# Patient Record
Sex: Female | Born: 1995
Health system: Southern US, Community
[De-identification: ages and names within clinical notes are randomized; demographics above are authoritative.]

## PROBLEM LIST (undated history)

## (undated) ENCOUNTER — Inpatient Hospital Stay (HOSPITAL_COMMUNITY): Payer: Self-pay

## (undated) DIAGNOSIS — R079 Chest pain, unspecified: Secondary | ICD-10-CM

## (undated) DIAGNOSIS — E119 Type 2 diabetes mellitus without complications: Secondary | ICD-10-CM

## (undated) DIAGNOSIS — R569 Unspecified convulsions: Secondary | ICD-10-CM

## (undated) DIAGNOSIS — O139 Gestational [pregnancy-induced] hypertension without significant proteinuria, unspecified trimester: Secondary | ICD-10-CM

## (undated) DIAGNOSIS — J45909 Unspecified asthma, uncomplicated: Secondary | ICD-10-CM

## (undated) DIAGNOSIS — R519 Headache, unspecified: Secondary | ICD-10-CM

## (undated) DIAGNOSIS — F32A Depression, unspecified: Secondary | ICD-10-CM

## (undated) DIAGNOSIS — F419 Anxiety disorder, unspecified: Secondary | ICD-10-CM

## (undated) DIAGNOSIS — B999 Unspecified infectious disease: Secondary | ICD-10-CM

## (undated) DIAGNOSIS — D219 Benign neoplasm of connective and other soft tissue, unspecified: Secondary | ICD-10-CM

## (undated) DIAGNOSIS — R011 Cardiac murmur, unspecified: Secondary | ICD-10-CM

## (undated) DIAGNOSIS — F329 Major depressive disorder, single episode, unspecified: Secondary | ICD-10-CM

## (undated) DIAGNOSIS — I10 Essential (primary) hypertension: Secondary | ICD-10-CM

## (undated) DIAGNOSIS — R0602 Shortness of breath: Secondary | ICD-10-CM

## (undated) HISTORY — DX: Headache, unspecified: R51.9

## (undated) HISTORY — PX: NO PAST SURGERIES: SHX2092

## (undated) HISTORY — DX: Chest pain, unspecified: R07.9

## (undated) HISTORY — DX: Shortness of breath: R06.02

## (undated) HISTORY — DX: Unspecified asthma, uncomplicated: J45.909

## (undated) HISTORY — PX: WISDOM TOOTH EXTRACTION: SHX21

---
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
## 2016-02-03 DIAGNOSIS — O1490 Unspecified pre-eclampsia, unspecified trimester: Secondary | ICD-10-CM

## 2016-06-09 ENCOUNTER — Emergency Department (HOSPITAL_COMMUNITY): Payer: Medicaid Other

## 2016-06-09 ENCOUNTER — Encounter (HOSPITAL_COMMUNITY): Payer: Self-pay | Admitting: Emergency Medicine

## 2016-06-09 ENCOUNTER — Emergency Department (HOSPITAL_COMMUNITY)
Admission: EM | Admit: 2016-06-09 | Discharge: 2016-06-10 | Disposition: A | Discharge status: Home / Self Care | Payer: Medicaid Other | Attending: Emergency Medicine | Admitting: Emergency Medicine

## 2016-06-09 DIAGNOSIS — B349 Viral infection, unspecified: Secondary | ICD-10-CM | POA: Insufficient documentation

## 2016-06-09 MED ORDER — ONDANSETRON 4 MG PO TBDP
4.0000 mg | ORAL_TABLET | Freq: Once | ORAL | Status: AC
  Administered 2016-06-09: 4 mg via ORAL
  Filled 2016-06-09: qty 1

## 2016-06-09 MED ORDER — ACETAMINOPHEN 325 MG PO TABS
ORAL_TABLET | ORAL | Status: AC
  Filled 2016-06-09: qty 2

## 2016-06-09 MED ORDER — ACETAMINOPHEN 325 MG PO TABS
650.0000 mg | ORAL_TABLET | Freq: Once | ORAL | Status: AC
  Administered 2016-06-09: 650 mg via ORAL

## 2016-06-09 NOTE — ED Triage Notes (Signed)
Pt presents to ED for assessment of 2 days worth of nausea, vomiting, fevers and chills, body aches, congestion, cough, sore throat, and rib pain with coughing.

## 2016-06-09 NOTE — ED Provider Notes (Signed)
[facility] DEPT Provider Note   CSN: [id number] Arrival date & time: 06/09/16  2203     History   Chief Complaint Chief Complaint  Patient presents with  . flu-like symptoms    HPI Margaret Ingram is a 21 y.o. female.  This is a 21 year old female reports that for the past 2 days.  She's had myalgias, low-grade fever, cough, sore throat.  Today she had nausea and vomiting.  No diarrhea. She is 4 months postpartum, not nursing, she had a Norplant Birth control  and placed after delivery      Past Medical History:  Diagnosis Date  . Heart murmur     There are no active problems to display for this patient.   History reviewed. No pertinent surgical history.  OB History    Gravida Para Term Preterm AB Living   1             SAB TAB Ectopic Multiple Live Births                   Home Medications    Prior to Admission medications   Medication Sig Start Date End Date Taking? Authorizing Provider  ondansetron (ZOFRAN) 4 MG tablet Take 1 tablet (4 mg total) by mouth every 6 (six) hours. 06/10/16   Junius Creamer, NP  Prenatal Vit-Fe Fumarate-FA (PRENATAL COMPLETE) 14-0.4 MG TABS Take 1 tablet by mouth every morning. 06/27/15   Veatrice Kells, MD    Family History Family History  Problem Relation Age of Onset  . Diabetes Other     Social History Social History  Substance Use Topics  . Smoking status: Never Smoker  . Smokeless tobacco: Never Used  . Alcohol use No     Allergies   Azithromycin   Review of Systems Review of Systems  Constitutional: Positive for fever.  HENT: Positive for sore throat.   Respiratory: Positive for cough. Negative for shortness of breath.   Gastrointestinal: Positive for nausea and vomiting. Negative for diarrhea.  Genitourinary: Negative for dysuria.  All other systems reviewed and are negative.    Physical Exam Updated Vital Signs Pulse (!) 111   Temp (!) 101.4 F (38.6 C) (Oral)   Resp 16   Ht 5\' 2"  (1.575 m)    Wt 98.9 kg   SpO2 97%   Breastfeeding? Unknown   BMI 39.87 kg/m   Physical Exam  Constitutional: She appears well-developed and well-nourished. No distress.  HENT:  Head: Normocephalic.  Mouth/Throat: Oropharynx is clear and moist. No oropharyngeal exudate.  Eyes: Pupils are equal, round, and reactive to light.  Cardiovascular: Regular rhythm.  Tachycardia present.   Pulmonary/Chest: Effort normal. She has no wheezes.  Abdominal: Soft. She exhibits no distension. There is no tenderness.  Musculoskeletal: Normal range of motion.  Lymphadenopathy:    She has no cervical adenopathy.  Neurological: She is alert.  Skin: Skin is warm.  Psychiatric: She has a normal mood and affect.  Nursing note and vitals reviewed.    ED Treatments / Results  Labs (all labs ordered are listed, but only abnormal results are displayed) Labs Reviewed - No data to display  EKG  EKG Interpretation None       Radiology Dg Chest 2 View  Result Date: 06/09/2016 CLINICAL DATA:  Nausea, vomiting, fever, chills, myalgias, chest congestion, cough, pharyngitis and right rib pain with coughing. EXAM: CHEST  2 VIEW COMPARISON:  06/03/2015. FINDINGS: The heart size and mediastinal contours are within normal limits. Both lungs  are clear. The visualized skeletal structures are unremarkable. IMPRESSION: Normal examination. Electronically Signed   By: Claudie Revering M.D.   On: 06/09/2016 23:17    Procedures Procedures (including critical care time)  Medications Ordered in ED Medications  acetaminophen (TYLENOL) tablet 650 mg (650 mg Oral Given 06/09/16 2216)  ondansetron (ZOFRAN-ODT) disintegrating tablet 4 mg (4 mg Oral Given 06/09/16 2351)     Initial Impression / Assessment and Plan / ED Course  I have reviewed the triage vital signs and the nursing notes.  Pertinent labs & imaging results that were available during my care of the patient were reviewed by me and considered in my medical decision  making (see chart for details).    Chest x-ray shows no sign of pneumonia.  Thorough examination is benign without any exudate.  No cervical adenopathy  She was given ODT Zofran is now tolerating fluids.  She'll be discharged home with prescription for Zofran  Final Clinical Impressions(s) / ED Diagnoses   Final diagnoses:  Viral syndrome    New Prescriptions New Prescriptions   ONDANSETRON (ZOFRAN) 4 MG TABLET    Take 1 tablet (4 mg total) by mouth every 6 (six) hours.     Junius Creamer, NP 06/10/16 [place], MD 06/11/16 1212

## 2016-06-10 MED ORDER — ONDANSETRON HCL 4 MG PO TABS: 4.0000 mg | ORAL_TABLET | Freq: Four times a day (QID) | ORAL | 0 refills | Status: DC

## 2016-06-10 NOTE — ED Notes (Signed)
Pt stable, ambulatory, states understanding of discharge instructions 

## 2016-06-10 NOTE — Discharge Instructions (Signed)
Your strep test and cheat xray are normal You have been given a prescription for Zofran to help control nausea take as needed

## 2016-08-24 ENCOUNTER — Emergency Department (HOSPITAL_COMMUNITY)
Admission: EM | Admit: 2016-08-24 | Discharge: 2016-08-24 | Disposition: A | Discharge status: Home / Self Care | Payer: Medicaid Other | Attending: Emergency Medicine | Admitting: Emergency Medicine

## 2016-08-24 ENCOUNTER — Encounter (HOSPITAL_COMMUNITY): Payer: Self-pay | Admitting: Emergency Medicine

## 2016-08-24 DIAGNOSIS — Z79899 Other long term (current) drug therapy: Secondary | ICD-10-CM | POA: Insufficient documentation

## 2016-08-24 DIAGNOSIS — E860 Dehydration: Secondary | ICD-10-CM

## 2016-08-24 DIAGNOSIS — F419 Anxiety disorder, unspecified: Secondary | ICD-10-CM | POA: Insufficient documentation

## 2016-08-24 MED ORDER — ACETAMINOPHEN 325 MG PO TABS
650.0000 mg | ORAL_TABLET | Freq: Once | ORAL | Status: AC
  Administered 2016-08-24: 650 mg via ORAL
  Filled 2016-08-24: qty 2

## 2016-08-24 NOTE — ED Triage Notes (Addendum)
Per EMS-states patient was on bus and got overheated-states she became anxious-calm with EMS-EMS states she was very "dramatic" when they arrived on scene

## 2016-08-24 NOTE — Discharge Instructions (Signed)
It is important to remain well-hydrated and well-nourished. Make sure you eat and drink plenty of water. Follow-up with your primary care if you're continuing to feel anxious. Return to the emergency department if you develop fever, chills, persistent chest pain, or any new or worsening symptoms

## 2016-08-24 NOTE — ED Notes (Signed)
Patient was found on floor in lobby-was non responsive to staff when staff approached her although she was following simple commands and tracking staff with eyes-unknown how patient "fell" out of wheelchair-no LOC-patient appears to over exaggerating behavior

## 2016-08-24 NOTE — ED Provider Notes (Signed)
[facility] DEPT Provider Note   By signing my name below, I, Bea Graff, attest that this documentation has been prepared under the direction and in the presence of Quincy Boy, PA-C. Electronically Signed: Bea Graff, ED Scribe. 08/24/16. 3:32 PM.    History   Chief Complaint Chief Complaint  Patient presents with  . Anxiety   The history is provided by the patient and medical records. No language interpreter was used.    Margaret Ingram is an obese 21 y.o. female brought in by EMS who presents to the Emergency Department complaining of LOC that occurred PTA while at the bus stop. She reports associated shakiness, chest pressure and SOB that began while she was in the waiting room, but have since resolved. She states she has not eaten anything today. She reports h/o anxiety. She reports feeling light headed prior to the LOC. The LOC occurred when she elevated from a sitting to a standing position. She has not taken anything for her symptoms.  There are no modifying factors noted. She states she cannot remember if she hit her head when she fell. She denies fever, chills, sore throat, cough, abdominal pain, nausea, vomiting, dysuria, hematuria, frequency of urination. She has Nexplanon implanted since 03/2016. She reports she currently is not experiencing any symptoms while sitting.   Past Medical History:  Diagnosis Date  . Heart murmur     There are no active problems to display for this patient.   History reviewed. No pertinent surgical history.  OB History    Gravida Para Term Preterm AB Living   1             SAB TAB Ectopic Multiple Live Births                   Home Medications    Prior to Admission medications   Medication Sig Start Date End Date Taking? Authorizing Provider  ondansetron (ZOFRAN) 4 MG tablet Take 1 tablet (4 mg total) by mouth every 6 (six) hours. 06/10/16   Junius Creamer, NP  Prenatal Vit-Fe Fumarate-FA (PRENATAL COMPLETE)  14-0.4 MG TABS Take 1 tablet by mouth every morning. 06/27/15   Palumbo, April, MD    Family History Family History  Problem Relation Age of Onset  . Diabetes Other     Social History Social History  Substance Use Topics  . Smoking status: Never Smoker  . Smokeless tobacco: Never Used  . Alcohol use No     Allergies   Azithromycin   Review of Systems Review of Systems  Constitutional: Negative for chills and fever.  HENT: Negative for sore throat.   Respiratory: Negative for cough and shortness of breath.   Gastrointestinal: Negative for abdominal pain, nausea and vomiting.  Genitourinary: Negative for dysuria, frequency and hematuria.  Neurological: Positive for syncope and light-headedness.  Psychiatric/Behavioral: The patient is nervous/anxious.      Physical Exam Updated Vital Signs BP 134/74 (BP Location: Left Arm)   Pulse 83   Temp 99.3 F (37.4 C) (Oral)   Resp 18   SpO2 95%   Physical Exam  Constitutional: She is oriented to person, place, and time. She appears well-developed and well-nourished.  HENT:  Head: Normocephalic and atraumatic.  Neck: Normal range of motion.  Cardiovascular: Normal rate, regular rhythm and normal heart sounds.   Pulmonary/Chest: Effort normal and breath sounds normal. No respiratory distress.  Abdominal: Soft. There is no tenderness.  Musculoskeletal: Normal range of motion.  Neurological: She is alert and  oriented to person, place, and time.  Skin: Skin is warm and dry.  Psychiatric: She has a normal mood and affect. Her behavior is normal.  Nursing note and vitals reviewed.    ED Treatments / Results  DIAGNOSTIC STUDIES: Oxygen Saturation is 95% on RA, adequate by my interpretation.   COORDINATION OF CARE: 3:31 PM- Will check orthostatics. Will provide food and water and reevaluate. Pt verbalizes understanding and agrees to plan.  Medications  acetaminophen (TYLENOL) tablet 650 mg (650 mg Oral Given 08/24/16 1639)     Labs (all labs ordered are listed, but only abnormal results are displayed) Labs Reviewed - No data to display  EKG  EKG Interpretation None       Radiology No results found.  Procedures Procedures (including critical care time)  Medications Ordered in ED Medications  acetaminophen (TYLENOL) tablet 650 mg (650 mg Oral Given 08/24/16 1639)     Initial Impression / Assessment and Plan / ED Course  I have reviewed the triage vital signs and the nursing notes.  Pertinent labs & imaging results that were available during my care of the patient were reviewed by me and considered in my medical decision making (see chart for details).     Patient presenting with syncopal episode when going from sitting to standing. Previously healthy female with no medical problems other than anxiety. Patient states she has not eaten anything today. Additionally, patient reports shortness of breath, chest pressure while in the waiting room, but this has resolved. She has a history of anxiety, and states that she felt anxious during this event. She does not take anything for anxiety. Will order orthostatic vitals.  Patient orthostatics show decreased blood pressure upon standing, patient reports she felt lightheaded. Will give patient sent which and fluids by mouth and reassess.  On reassessment, patient states she feels much better. Orthostatic vitals negative for blood pressure change, and she reported no symptoms upon standing up. She reports a mild headache, we due to dehydration. We'll give Tylenol for this. Discussed importance of nutrition and hydration. Patient to follow-up with primary care in 1 week for her anxiety. Return precautions given. Discussed plan with patient, patient states she understands and agrees to plan  Final Clinical Impressions(s) / ED Diagnoses   Final diagnoses:  Dehydration  Anxiety    New Prescriptions Discharge Medication List as of 08/24/2016  5:01 PM     I  personally performed the services described in this documentation, which was scribed in my presence. The recorded information has been reviewed and is accurate.     Franchot Heidelberg, PA-C 08/24/16 1736    Orlie Dakin, MD 08/29/16 [phone]

## 2016-09-17 ENCOUNTER — Encounter (HOSPITAL_COMMUNITY): Payer: Self-pay | Admitting: Emergency Medicine

## 2016-09-17 DIAGNOSIS — Z5321 Procedure and treatment not carried out due to patient leaving prior to being seen by health care provider: Secondary | ICD-10-CM | POA: Insufficient documentation

## 2016-09-17 DIAGNOSIS — R1084 Generalized abdominal pain: Secondary | ICD-10-CM | POA: Insufficient documentation

## 2016-09-17 LAB — URINALYSIS, ROUTINE W REFLEX MICROSCOPIC
Bilirubin Urine: NEGATIVE
GLUCOSE, UA: NEGATIVE mg/dL
Ketones, ur: NEGATIVE mg/dL
Nitrite: NEGATIVE
PH: 6 (ref 5.0–8.0)
Protein, ur: NEGATIVE mg/dL
Specific Gravity, Urine: 1.018 (ref 1.005–1.030)

## 2016-09-17 LAB — CBC
HCT: 36.6 % (ref 36.0–46.0)
HEMOGLOBIN: 12.5 g/dL (ref 12.0–15.0)
MCH: 29 pg (ref 26.0–34.0)
MCHC: 34.2 g/dL (ref 30.0–36.0)
MCV: 84.9 fL (ref 78.0–100.0)
Platelets: 219 10*3/uL (ref 150–400)
RBC: 4.31 MIL/uL (ref 3.87–5.11)
RDW: 11.8 % (ref 11.5–15.5)
WBC: 5.1 10*3/uL (ref 4.0–10.5)

## 2016-09-17 LAB — COMPREHENSIVE METABOLIC PANEL
ALT: 17 U/L (ref 14–54)
ANION GAP: 7 (ref 5–15)
AST: 18 U/L (ref 15–41)
Albumin: 3.9 g/dL (ref 3.5–5.0)
Alkaline Phosphatase: 81 U/L (ref 38–126)
BUN: 7 mg/dL (ref 6–20)
CALCIUM: 9.1 mg/dL (ref 8.9–10.3)
CHLORIDE: 109 mmol/L (ref 101–111)
CO2: 24 mmol/L (ref 22–32)
Creatinine, Ser: 0.77 mg/dL (ref 0.44–1.00)
GFR calc non Af Amer: >60 mL/min (ref >60)
Glucose, Bld: 90 mg/dL (ref 65–99)
Potassium: 3.7 mmol/L (ref 3.5–5.1)
SODIUM: 140 mmol/L (ref 135–145)
Total Bilirubin: 0.6 mg/dL (ref 0.3–1.2)
Total Protein: 7.4 g/dL (ref 6.5–8.1)

## 2016-09-17 LAB — HCG, QUANTITATIVE, PREGNANCY

## 2016-09-17 LAB — LIPASE, BLOOD: LIPASE: 22 U/L (ref 11–51)

## 2016-09-17 MED ORDER — ONDANSETRON 4 MG PO TBDP
ORAL_TABLET | ORAL | Status: AC
  Filled 2016-09-17: qty 1

## 2016-09-17 MED ORDER — ONDANSETRON 4 MG PO TBDP
4.0000 mg | ORAL_TABLET | Freq: Once | ORAL | Status: AC | PRN
  Administered 2016-09-17: 4 mg via ORAL

## 2016-09-17 NOTE — ED Triage Notes (Signed)
Pt reports sharp-shooting pains in LLQ for two days, heavy vaginal bleeding and emesis/nausea.

## 2016-09-18 ENCOUNTER — Emergency Department (HOSPITAL_COMMUNITY)
Admission: EM | Admit: 2016-09-18 | Discharge: 2016-09-18 | Discharge status: Against Medical Advice | Payer: Self-pay | Attending: Emergency Medicine | Admitting: Emergency Medicine

## 2016-09-18 HISTORY — DX: Essential (primary) hypertension: I10

## 2016-09-18 HISTORY — DX: Type 2 diabetes mellitus without complications: E11.9

## 2016-09-18 NOTE — ED Notes (Signed)
Unable to locate in waiting room

## 2016-10-17 DIAGNOSIS — Z319 Encounter for procreative management, unspecified: Secondary | ICD-10-CM | POA: Insufficient documentation

## 2016-10-17 DIAGNOSIS — F419 Anxiety disorder, unspecified: Secondary | ICD-10-CM | POA: Insufficient documentation

## 2016-10-17 DIAGNOSIS — Z79899 Other long term (current) drug therapy: Secondary | ICD-10-CM | POA: Insufficient documentation

## 2016-10-17 DIAGNOSIS — R7303 Prediabetes: Secondary | ICD-10-CM | POA: Insufficient documentation

## 2016-10-17 DIAGNOSIS — R1084 Generalized abdominal pain: Secondary | ICD-10-CM | POA: Insufficient documentation

## 2016-10-17 DIAGNOSIS — I10 Essential (primary) hypertension: Secondary | ICD-10-CM | POA: Insufficient documentation

## 2016-10-17 DIAGNOSIS — Z3202 Encounter for pregnancy test, result negative: Secondary | ICD-10-CM | POA: Insufficient documentation

## 2016-10-17 DIAGNOSIS — R110 Nausea: Secondary | ICD-10-CM | POA: Insufficient documentation

## 2016-10-17 LAB — COMPREHENSIVE METABOLIC PANEL
ALBUMIN: 3.7 g/dL (ref 3.5–5.0)
ALT: 16 U/L (ref 14–54)
AST: 18 U/L (ref 15–41)
Alkaline Phosphatase: 65 U/L (ref 38–126)
Anion gap: 7 (ref 5–15)
BILIRUBIN TOTAL: 0.3 mg/dL (ref 0.3–1.2)
BUN: 15 mg/dL (ref 6–20)
CO2: 24 mmol/L (ref 22–32)
Calcium: 9.1 mg/dL (ref 8.9–10.3)
Chloride: 107 mmol/L (ref 101–111)
Creatinine, Ser: 0.81 mg/dL (ref 0.44–1.00)
GFR calc Af Amer: >60 mL/min (ref >60)
GFR calc non Af Amer: >60 mL/min (ref >60)
GLUCOSE: 106 mg/dL — AB (ref 65–99)
POTASSIUM: 3.6 mmol/L (ref 3.5–5.1)
Sodium: 138 mmol/L (ref 135–145)
TOTAL PROTEIN: 7.2 g/dL (ref 6.5–8.1)

## 2016-10-17 LAB — URINALYSIS, ROUTINE W REFLEX MICROSCOPIC
BILIRUBIN URINE: NEGATIVE
Glucose, UA: NEGATIVE mg/dL
HGB URINE DIPSTICK: NEGATIVE
KETONES UR: NEGATIVE mg/dL
Leukocytes, UA: NEGATIVE
NITRITE: NEGATIVE
PH: 5 (ref 5.0–8.0)
Protein, ur: NEGATIVE mg/dL
Specific Gravity, Urine: 1.028 (ref 1.005–1.030)

## 2016-10-17 LAB — CBC
HEMATOCRIT: 36 % (ref 36.0–46.0)
Hemoglobin: 11.9 g/dL — ABNORMAL LOW (ref 12.0–15.0)
MCH: 28.5 pg (ref 26.0–34.0)
MCHC: 33.1 g/dL (ref 30.0–36.0)
MCV: 86.1 fL (ref 78.0–100.0)
Platelets: 193 10*3/uL (ref 150–400)
RBC: 4.18 MIL/uL (ref 3.87–5.11)
RDW: 11.7 % (ref 11.5–15.5)
WBC: 4.5 10*3/uL (ref 4.0–10.5)

## 2016-10-17 LAB — LIPASE, BLOOD: Lipase: 23 U/L (ref 11–51)

## 2016-10-17 LAB — I-STAT BETA HCG BLOOD, ED (MC, WL, AP ONLY): I-stat hCG, quantitative: <5 m[IU]/mL (ref <5)

## 2016-10-17 MED ORDER — ONDANSETRON 4 MG PO TBDP
4.0000 mg | ORAL_TABLET | Freq: Once | ORAL | Status: AC
  Administered 2016-10-17: 4 mg via ORAL

## 2016-10-17 MED ORDER — ONDANSETRON 4 MG PO TBDP
ORAL_TABLET | ORAL | Status: AC
  Filled 2016-10-17: qty 1

## 2016-10-17 NOTE — ED Triage Notes (Signed)
Pt presents to Ed after having a panic attack earlier, which she states she was able to help herself through.  Patient now concerned because she has been having intermittent nausea, abdominal pain (diffuse in nature) and is concerned she may be pregnant.

## 2016-10-18 ENCOUNTER — Emergency Department (HOSPITAL_COMMUNITY)
Admission: EM | Admit: 2016-10-18 | Discharge: 2016-10-18 | Disposition: A | Discharge status: Home / Self Care | Payer: Self-pay | Attending: Emergency Medicine | Admitting: Emergency Medicine

## 2016-10-18 DIAGNOSIS — F419 Anxiety disorder, unspecified: Secondary | ICD-10-CM

## 2016-10-18 DIAGNOSIS — Z319 Encounter for procreative management, unspecified: Secondary | ICD-10-CM

## 2016-10-18 DIAGNOSIS — R1084 Generalized abdominal pain: Secondary | ICD-10-CM

## 2016-10-18 MED ORDER — PANTOPRAZOLE SODIUM 40 MG PO TBEC
40.0000 mg | DELAYED_RELEASE_TABLET | Freq: Once | ORAL | Status: AC
  Administered 2016-10-18: 40 mg via ORAL
  Filled 2016-10-18: qty 1

## 2016-10-18 MED ORDER — LORAZEPAM 1 MG PO TABS
1.0000 mg | ORAL_TABLET | Freq: Once | ORAL | Status: AC
  Administered 2016-10-18: 1 mg via ORAL
  Filled 2016-10-18: qty 1

## 2016-10-18 MED ORDER — PRENATAL COMPLETE 14-0.4 MG PO TABS: 2.0000 | ORAL_TABLET | Freq: Every day | ORAL | 0 refills | Status: DC

## 2016-10-18 MED ORDER — PANTOPRAZOLE SODIUM 20 MG PO TBEC: 20.0000 mg | DELAYED_RELEASE_TABLET | Freq: Every day | ORAL | 0 refills | Status: DC

## 2016-10-18 NOTE — Discharge Instructions (Signed)
1. Medications: protonix, prenatal vitamin, usual home medications 2. Treatment: rest, drink plenty of fluids, advance diet slowly 3. Follow Up: Please followup with your primary doctor in 2 days for discussion of your diagnoses and further evaluation after today's visit; if you do not have a primary care doctor use the resource guide provided to find one; Please return to the ER for persistent vomiting, high fevers or worsening symptoms

## 2016-10-18 NOTE — ED Notes (Signed)
Updated on delays and wait time

## 2016-10-18 NOTE — ED Provider Notes (Signed)
[facility] DEPT Provider Note   CSN: [id number] Arrival date & time: 10/17/16  2050     History   Chief Complaint Chief Complaint  Patient presents with  . Anxiety  . Abdominal Pain    HPI Margaret Ingram is a 21 y.o. female with a hx of pregnancy induced HTN, prediabetes, anxiety presents to the Emergency Department complaining of intermittent generalized abd cramping onset 3-4 days ago.  Pt Reports that she has had persistent nausea throughout that time. She reports she sexually active with one female partner and is attempting to become pregnant. She reports last menstrual cycle was March 2018 however she had her Nexplanon removed August 14th.  Patient reports that sometimes eating makes her pain worse. She reports one episode of vomiting yesterday with nonbloody and nonbilious emesis. She reports since that time she has been able eat and drink without persistent vomiting.  Patient also reports  increasing anxiety. She reports she had a panic attack today she became too hot. She reports significant anxiety over wishing for pregnancy. Patient reports that her doctor is helping her manage her anxiety. She has been taking and antianxiety medication but she cannot remember which one. Patient also reports anxiety over increased blood pressures over the last few months.  She reports systolic blood pressures in the 150s when she checks them. She has not discussed this with her primary care physician.   The history is provided by the patient and medical records. No language interpreter was used.    Past Medical History:  Diagnosis Date  . Diabetes mellitus without complication ([place])    pre diabetic  . Heart murmur   . Hypertension     There are no active problems to display for this patient.   No past surgical history on file.  OB History    Gravida Para Term Preterm AB Living   1             SAB TAB Ectopic Multiple Live Births                   Home Medications     Prior to Admission medications   Medication Sig Start Date End Date Taking? Authorizing Provider  ondansetron (ZOFRAN) 4 MG tablet Take 1 tablet (4 mg total) by mouth every 6 (six) hours. 06/10/16   Junius Creamer, NP  pantoprazole (PROTONIX) 20 MG tablet Take 1 tablet (20 mg total) by mouth daily. 10/18/16   Monterio Bob, Jarrett Soho, PA-C  Prenatal Vit-Fe Fumarate-FA (PRENATAL COMPLETE) 14-0.4 MG TABS Take 2 tablets by mouth daily. 10/18/16   Kawanda Drumheller, Jarrett Soho, PA-C    Family History Family History  Problem Relation Age of Onset  . Diabetes Other     Social History Social History  Substance Use Topics  . Smoking status: Never Smoker  . Smokeless tobacco: Never Used  . Alcohol use No     Allergies   Azithromycin   Review of Systems Review of Systems  Constitutional: Negative for appetite change, diaphoresis, fatigue, fever and unexpected weight change.  HENT: Negative for mouth sores.   Eyes: Negative for visual disturbance.  Respiratory: Negative for cough, chest tightness, shortness of breath and wheezing.   Cardiovascular: Negative for chest pain.  Gastrointestinal: Positive for abdominal pain ( generalized), nausea and vomiting (x1). Negative for constipation and diarrhea.  Endocrine: Negative for polydipsia, polyphagia and polyuria.  Genitourinary: Negative for dysuria, frequency, hematuria and urgency.  Musculoskeletal: Negative for back pain and neck stiffness.  Skin: Negative for rash.  Allergic/Immunologic: Negative for immunocompromised state.  Neurological: Negative for syncope, light-headedness and headaches.  Hematological: Does not bruise/bleed easily.  Psychiatric/Behavioral: Negative for sleep disturbance. The patient is nervous/anxious.      Physical Exam Updated Vital Signs BP 134/73   Pulse 80   Temp 98 F (36.7 C)   Resp 18   Ht 5\' 2"  (1.575 m)   Wt 106.6 kg (235 lb)   LMP 05/02/2016   SpO2 100%   BMI 42.98 kg/m   Physical Exam   Constitutional: She appears well-developed and well-nourished. No distress.  Awake, alert, nontoxic appearance  HENT:  Head: Normocephalic and atraumatic.  Mouth/Throat: Oropharynx is clear and moist. No oropharyngeal exudate.  Eyes: Conjunctivae are normal. No scleral icterus.  Neck: Normal range of motion. Neck supple.  Cardiovascular: Normal rate, regular rhythm and intact distal pulses.   Pulmonary/Chest: Effort normal and breath sounds normal. No respiratory distress. She has no wheezes.  Equal chest expansion  Abdominal: Soft. Bowel sounds are normal. She exhibits no mass. There is no tenderness. There is no rebound and no guarding.  Abd soft and nontender  Musculoskeletal: Normal range of motion. She exhibits no edema.  Neurological: She is alert.  Speech is clear and goal oriented Moves extremities without ataxia  Skin: Skin is warm and dry. She is not diaphoretic.  Psychiatric: Her mood appears anxious.  Nursing note and vitals reviewed.    ED Treatments / Results  Labs (all labs ordered are listed, but only abnormal results are displayed) Labs Reviewed  COMPREHENSIVE METABOLIC PANEL - Abnormal; Notable for the following:       Result Value   Glucose, Bld 106 (*)    All other components within normal limits  CBC - Abnormal; Notable for the following:    Hemoglobin 11.9 (*)    All other components within normal limits  LIPASE, BLOOD  URINALYSIS, ROUTINE W REFLEX MICROSCOPIC  I-STAT BETA HCG BLOOD, ED ([facility], [facility], [facility] ONLY)    Procedures Procedures (including critical care time)  Medications Ordered in ED Medications  ondansetron (ZOFRAN-ODT) disintegrating tablet 4 mg (4 mg Oral Given 10/17/16 2125)  LORazepam (ATIVAN) tablet 1 mg (1 mg Oral Given 10/18/16 0603)  pantoprazole (PROTONIX) EC tablet 40 mg (40 mg Oral Given 10/18/16 0603)     Initial Impression / Assessment and Plan / ED Course  I have reviewed the triage vital signs and the nursing notes.  Pertinent  labs & imaging results that were available during my care of the patient were reviewed by me and considered in my medical decision making (see chart for details).     Patient presents emergency department with several complaints. Generalized abdominal pain and persistent nausea. She believes she might be pregnant. Pregnancy test here is negative. Labs are reassuring without elevation in lipase, AST her ALT. Abdomen is soft and nontender on exam. Patient had one episode of emesis yesterday however no episodes here. She has tolerated by mouth without difficulty.  Patient also with complaints of anxiety. She reports numerous life stressors but also reports she is under the care of a physician for her anxiety. She reports concern of hypertension however last blood pressure is 132/68.  Her blood pressure on arrival was 157/70. At this time do not want to start patient on hypertension medications as her blood pressures appear to normalize and remain normal throughout much of her time here. She is to talk to her primary care physician about this.  Patient will be started on  prenatal vitamins for 3 pregnancy. Will also give on omeprazole for generalized abdominal discomfort and nausea.  Final Clinical Impressions(s) / ED Diagnoses   Final diagnoses:  Anxiety  Generalized abdominal pain  Desire for pregnancy    New Prescriptions Discharge Medication List as of 10/18/2016  5:57 AM    START taking these medications   Details  pantoprazole (PROTONIX) 20 MG tablet Take 1 tablet (20 mg total) by mouth daily., Starting Thu 10/18/2016, Print         Aashritha Miedema, [place], PA-C 10/18/16 9597    Everlene Balls, MD 10/18/16 1426

## 2016-10-19 ENCOUNTER — Encounter (HOSPITAL_COMMUNITY): Payer: Self-pay | Admitting: Emergency Medicine

## 2016-10-19 ENCOUNTER — Emergency Department (HOSPITAL_COMMUNITY)
Admission: EM | Admit: 2016-10-19 | Discharge: 2016-10-19 | Disposition: A | Discharge status: Home / Self Care | Payer: Self-pay | Attending: Emergency Medicine | Admitting: Emergency Medicine

## 2016-10-19 DIAGNOSIS — R42 Dizziness and giddiness: Secondary | ICD-10-CM | POA: Insufficient documentation

## 2016-10-19 DIAGNOSIS — F410 Panic disorder [episodic paroxysmal anxiety] without agoraphobia: Secondary | ICD-10-CM | POA: Insufficient documentation

## 2016-10-19 DIAGNOSIS — R030 Elevated blood-pressure reading, without diagnosis of hypertension: Secondary | ICD-10-CM | POA: Insufficient documentation

## 2016-10-19 DIAGNOSIS — I10 Essential (primary) hypertension: Secondary | ICD-10-CM | POA: Insufficient documentation

## 2016-10-19 DIAGNOSIS — E119 Type 2 diabetes mellitus without complications: Secondary | ICD-10-CM | POA: Insufficient documentation

## 2016-10-19 HISTORY — DX: Anxiety disorder, unspecified: F41.9

## 2016-10-19 MED ORDER — HYDROXYZINE HCL 50 MG PO TABS: 50.0000 mg | ORAL_TABLET | Freq: Four times a day (QID) | ORAL | 0 refills | Status: DC | PRN

## 2016-10-19 MED ORDER — HYDROXYZINE HCL 25 MG PO TABS
50.0000 mg | ORAL_TABLET | Freq: Once | ORAL | Status: AC
  Administered 2016-10-19: 50 mg via ORAL
  Filled 2016-10-19: qty 2

## 2016-10-19 NOTE — ED Notes (Signed)
Bed: [facility] Expected date:  Expected time:  Means of arrival:  Comments: 

## 2016-10-19 NOTE — ED Triage Notes (Signed)
Per EMS, from work was found by coworker "staring into space". States she felt like she was having an anxiety attack after being overwhelmed at work. She said she became overheated and dizzy. Hx of anxiety and states that this feels similar to past instances of anxiety. Denies syncope.

## 2016-10-19 NOTE — ED Provider Notes (Signed)
[facility] DEPT Provider Note   CSN: [id number] Arrival date & time: 10/19/16  1237     History   Chief Complaint Chief Complaint  Patient presents with  . Anxiety    HPI Margaret Ingram is a 21 y.o. female with a PMHx of anxiety, prediabetes, and HTN, who presents to the ED with complaints of having an anxiety attack. Patient states that she has been having anxiety issues due to increased stress in her life. She is not currently on anything for anxiety, and states that she hasn't actually mentioned it to her PCP yet. Today at work ~1hr ago, she got overheated and overwhelmed, started to feel lightheaded, so someone helped her to the ground. She states that her anxiety worsens with becoming overwhelmed and overheated, improves with rest, and she did not try any treatments prior to arrival. She now feels improved after a brief period of rest. She states this episode today of the exact same as her prior anxiety attacks, including the same as yesterday. Chart review pt was seen yesterday for abd pain/nausea and anxiety attack, labs were reassuring, she received ativan 1mg  PO and then discharged with protonix and prenatals, and advised to f/up with her PCP for ongoing management of her anxiety. She has not yet followed up with them, since it's only been 1 day. She's also worried about her BP being high, initially it was in the 762G systolic, but has come down since then. She wonders if she needs to be on any BP meds. She states she's mentioned this to her PCP before but has never been started on anything for it. Per chart review, in Feb 2018 her BP was 120s/90s and last visit earlier this month it was 130s/90s. She states that worrying about her BP makes her more anxious.   She denies fevers, chills, palpitations, CP, SOB, new/changed abd pain, ongoing nausea, V/D/C, hematuria, dysuria, myalgias, arthralgias, vision changes, syncope, numbness, tingling, focal weakness, SI, HI, AVH, or any other  complaints at this time.    The history is provided by the patient and medical records. No language interpreter was used.  Anxiety  This is a recurrent problem. The current episode started 1 to 2 hours ago. The problem occurs every several days. The problem has been rapidly improving. Pertinent negatives include no chest pain, no abdominal pain, no headaches and no shortness of breath. Exacerbated by: overheating, becoming overwhelmed. The symptoms are relieved by rest. She has tried rest for the symptoms. The treatment provided significant relief.    Past Medical History:  Diagnosis Date  . Anxiety   . Diabetes mellitus without complication ([place])    pre diabetic  . Heart murmur   . Hypertension     There are no active problems to display for this patient.   History reviewed. No pertinent surgical history.  OB History    Gravida Para Term Preterm AB Living   1             SAB TAB Ectopic Multiple Live Births                   Home Medications    Prior to Admission medications   Medication Sig Start Date End Date Taking? Authorizing Provider  ondansetron (ZOFRAN) 4 MG tablet Take 1 tablet (4 mg total) by mouth every 6 (six) hours. 06/10/16   Junius Creamer, NP  pantoprazole (PROTONIX) 20 MG tablet Take 1 tablet (20 mg total) by mouth daily. 10/18/16   [place],  Jarrett Soho, PA-C  Prenatal Vit-Fe Fumarate-FA (PRENATAL COMPLETE) 14-0.4 MG TABS Take 2 tablets by mouth daily. 10/18/16   Mut[place]baugh, Jarrett Soho, PA-C    Family History Family History  Problem Relation Age of Onset  . Diabetes Other     Social History Social History  Substance Use Topics  . Smoking status: Never Smoker  . Smokeless tobacco: Never Used  . Alcohol use No     Allergies   Azithromycin   Review of Systems Review of Systems  Constitutional: Negative for chills and fever.  Eyes: Negative for visual disturbance.  Respiratory: Negative for shortness of breath.   Cardiovascular: Negative for  chest pain and palpitations.  Gastrointestinal: Negative for abdominal pain, constipation, diarrhea, nausea and vomiting.  Genitourinary: Negative for dysuria and hematuria.  Musculoskeletal: Negative for arthralgias and myalgias.  Skin: Negative for color change.  Allergic/Immunologic: Negative for immunocompromised state.  Neurological: Positive for light-headedness. Negative for syncope, weakness, numbness and headaches.  Psychiatric/Behavioral: Negative for confusion, hallucinations and suicidal ideas. The patient is nervous/anxious.    All other systems reviewed and are negative for acute change except as noted in the HPI.    Physical Exam Updated Vital Signs BP (!) 140/91 (BP Location: Left Arm)   Pulse 72   Temp 97.9 F (36.6 C)   Resp 16   SpO2 97%   Physical Exam  Constitutional: She is oriented to person, place, and time. Vital signs are normal. She appears well-developed and well-nourished.  Non-toxic appearance. No distress.  Afebrile, nontoxic, NAD, mildly elevated BP but trending down since arrival  HENT:  Head: Normocephalic and atraumatic.  Mouth/Throat: Oropharynx is clear and moist and mucous membranes are normal.  Eyes: Conjunctivae and EOM are normal. Right eye exhibits no discharge. Left eye exhibits no discharge.  Neck: Normal range of motion. Neck supple.  Cardiovascular: Normal rate, regular rhythm, normal heart sounds and intact distal pulses.  Exam reveals no gallop and no friction rub.   No murmur heard. Pulmonary/Chest: Effort normal and breath sounds normal. No respiratory distress. She has no decreased breath sounds. She has no wheezes. She has no rhonchi. She has no rales.  Abdominal: Soft. Normal appearance and bowel sounds are normal. She exhibits no distension. There is no tenderness. There is no rigidity, no rebound, no guarding, no CVA tenderness, no tenderness at McBurney's point and negative Murphy's sign.  Musculoskeletal: Normal range of motion.   Neurological: She is alert and oriented to person, place, and time. She has normal strength. No sensory deficit.  Skin: Skin is warm, dry and intact. No rash noted.  Psychiatric: Her speech is normal and behavior is normal. Her mood appears anxious. She is not actively hallucinating. She expresses no homicidal and no suicidal ideation. She expresses no suicidal plans and no homicidal plans.  Slightly anxious, denies SI/HI/AVH, not responding to internal stimuli  Nursing note and vitals reviewed.    ED Treatments / Results  Labs (all labs ordered are listed, but only abnormal results are displayed) Labs Reviewed - No data to display  EKG  EKG Interpretation  Date/Time:  Friday October 19 2016 14:40:22 EDT Ventricular Rate:  67 PR Interval:    QRS Duration: 94 QT Interval:  409 QTC Calculation: 432 R Axis:   43 Text Interpretation:  Sinus rhythm No significant change was found Confirmed by Jola Schmidt [phone]) on 10/19/2016 3:06:27 PM       Radiology No results found.  Procedures Procedures (including critical care time)  Medications Ordered  in ED Medications  hydrOXYzine (ATARAX/VISTARIL) tablet 50 mg (50 mg Oral Given 10/19/16 1442)     Initial Impression / Assessment and Plan / ED Course  I have reviewed the triage vital signs and the nursing notes.  Pertinent labs & imaging results that were available during my care of the patient were reviewed by me and considered in my medical decision making (see chart for details).     21 y.o. female here with anxiety attack. Seen yesterday for similar complaints, states she gets overheated and overwhelmed at work, gets lightheaded, and feels anxious. Now feels improved since arrival. Not on anything for anxiety. Work up yesterday unremarkable, no EKG done but labs otherwise WNL. Discharged with no meds for anxiety, advised to f/up with PCP for this. On exam, mildly elevated BP but overall trending down with time, slightly anxious  appearing but otherwise no concerning exam findings. Will get EKG and give vistaril, then likely d/c home with this. Doubt need for further emergent work up at this time. Will reassess shortly  4:25 PM EKG unremarkable. BP 129/81 on recheck. Doubt need for starting HTN meds given that her BP seems to improve with lessening anxiety/stress and as time goes by; advised DASH diet, stress control, and f/up with PCP to reassess if antiHTN meds are needed, which I doubt. Pt feeling much better, less anxious, no further symptoms. Doubt need for further emergent work up at this time. Will d/c home with vistaril. F/up with PCP in 1wk for recheck of symptoms. I explained the diagnosis and have given explicit precautions to return to the ER including for any other new or worsening symptoms. The patient understands and accepts the medical plan as it's been dictated and I have answered their questions. Discharge instructions concerning home care and prescriptions have been given. The patient is STABLE and is discharged to home in good condition.    Final Clinical Impressions(s) / ED Diagnoses   Final diagnoses:  Anxiety attack  Episodic lightheadedness  Borderline high blood pressure    New Prescriptions New Prescriptions   HYDROXYZINE (ATARAX/VISTARIL) 50 MG TABLET    Take 1 tablet (50 mg total) by mouth every 6 (six) hours as needed for anxiety.     [address], [place], [place] 10/19/16 1625    Jola Schmidt, MD 10/19/16 [phone]

## 2016-10-19 NOTE — Discharge Instructions (Signed)
Your symptoms seem consistent with anxiety. Your blood pressure could be up simply because of anxiety and stress, and it seems to improve as you relaxed here in the ER. Use vistaril as directed as needed for anxiety. This may make you drowsy so don't drive until you know how this medication will make you feel.  You can always take a half of a tablet instead, if it makes you too sleepy to take the whole tablet. Stay well hydrated, get plenty of rest, eat a low salt diet to help with your blood pressures. Follow up with your regular family doctor in 1 week for recheck of symptoms and ongoing management of your conditions. Return to the ER for emergent changes or worsening symptoms.

## 2016-10-19 NOTE — ED Notes (Signed)
Bed: WA08 Expected date:  Expected time:  Means of arrival:  Comments: Per [place]

## 2016-10-25 ENCOUNTER — Encounter (HOSPITAL_COMMUNITY): Payer: Self-pay

## 2016-10-25 ENCOUNTER — Emergency Department (HOSPITAL_COMMUNITY)
Admission: EM | Admit: 2016-10-25 | Discharge: 2016-10-26 | Disposition: A | Discharge status: Home / Self Care | Payer: Self-pay | Attending: Emergency Medicine | Admitting: Emergency Medicine

## 2016-10-25 DIAGNOSIS — Z8669 Personal history of other diseases of the nervous system and sense organs: Secondary | ICD-10-CM | POA: Insufficient documentation

## 2016-10-25 DIAGNOSIS — Z87898 Personal history of other specified conditions: Secondary | ICD-10-CM

## 2016-10-25 DIAGNOSIS — I10 Essential (primary) hypertension: Secondary | ICD-10-CM | POA: Insufficient documentation

## 2016-10-25 DIAGNOSIS — F419 Anxiety disorder, unspecified: Secondary | ICD-10-CM | POA: Insufficient documentation

## 2016-10-25 DIAGNOSIS — Z79899 Other long term (current) drug therapy: Secondary | ICD-10-CM | POA: Insufficient documentation

## 2016-10-25 DIAGNOSIS — E119 Type 2 diabetes mellitus without complications: Secondary | ICD-10-CM | POA: Insufficient documentation

## 2016-10-25 DIAGNOSIS — R102 Pelvic and perineal pain: Secondary | ICD-10-CM | POA: Insufficient documentation

## 2016-10-25 LAB — I-STAT BETA HCG BLOOD, ED (MC, WL, AP ONLY)

## 2016-10-25 MED ORDER — ONDANSETRON 4 MG PO TBDP
4.0000 mg | ORAL_TABLET | Freq: Once | ORAL | Status: AC
  Administered 2016-10-25: 4 mg via ORAL
  Filled 2016-10-25: qty 1

## 2016-10-25 MED ORDER — ACETAMINOPHEN 325 MG PO TABS
650.0000 mg | ORAL_TABLET | Freq: Once | ORAL | Status: AC
  Administered 2016-10-25: 650 mg via ORAL
  Filled 2016-10-25: qty 2

## 2016-10-25 NOTE — ED Triage Notes (Signed)
Patient arrives by EMS with complaints of pseudo-seizure. Family called EMS due to "2 minutes of shaking after she got home from work". Patient works at [employer]. No exposure to toxins. Patient has a hx of these. Patient with generalized tremors, but stops when asked questions-states she follows up at [facility].

## 2016-10-26 LAB — COMPREHENSIVE METABOLIC PANEL
ALK PHOS: 63 U/L (ref 38–126)
ALT: 16 U/L (ref 14–54)
AST: 18 U/L (ref 15–41)
Albumin: 3.8 g/dL (ref 3.5–5.0)
Anion gap: 9 (ref 5–15)
BILIRUBIN TOTAL: 0.5 mg/dL (ref 0.3–1.2)
BUN: 10 mg/dL (ref 6–20)
CO2: 21 mmol/L — AB (ref 22–32)
CREATININE: 0.76 mg/dL (ref 0.44–1.00)
Calcium: 8.6 mg/dL — ABNORMAL LOW (ref 8.9–10.3)
Chloride: 107 mmol/L (ref 101–111)
Glucose, Bld: 90 mg/dL (ref 65–99)
Potassium: 3.5 mmol/L (ref 3.5–5.1)
SODIUM: 137 mmol/L (ref 135–145)
TOTAL PROTEIN: 7.3 g/dL (ref 6.5–8.1)

## 2016-10-26 LAB — CBC
HEMATOCRIT: 34.3 % — AB (ref 36.0–46.0)
HEMOGLOBIN: 11.7 g/dL — AB (ref 12.0–15.0)
MCH: 28.7 pg (ref 26.0–34.0)
MCHC: 34.1 g/dL (ref 30.0–36.0)
MCV: 84.1 fL (ref 78.0–100.0)
Platelets: 178 10*3/uL (ref 150–400)
RBC: 4.08 MIL/uL (ref 3.87–5.11)
RDW: 11.8 % (ref 11.5–15.5)
WBC: 4.8 10*3/uL (ref 4.0–10.5)

## 2016-10-26 LAB — WET PREP, GENITAL
Clue Cells Wet Prep HPF POC: NONE SEEN
TRICH WET PREP: NONE SEEN
YEAST WET PREP: NONE SEEN

## 2016-10-26 MED ORDER — ONDANSETRON 4 MG PO TBDP
ORAL_TABLET | ORAL | Status: AC
  Filled 2016-10-26: qty 1

## 2016-10-26 MED ORDER — AZITHROMYCIN 250 MG PO TABS
1000.0000 mg | ORAL_TABLET | Freq: Once | ORAL | Status: AC
  Administered 2016-10-26: 1000 mg via ORAL

## 2016-10-26 MED ORDER — HYDROXYZINE HCL 25 MG PO TABS
ORAL_TABLET | ORAL | Status: AC
  Filled 2016-10-26: qty 1

## 2016-10-26 MED ORDER — AZITHROMYCIN 250 MG PO TABS
ORAL_TABLET | ORAL | Status: AC
  Filled 2016-10-26: qty 4

## 2016-10-26 MED ORDER — HYDROXYZINE HCL 25 MG PO TABS
25.0000 mg | ORAL_TABLET | Freq: Once | ORAL | Status: AC
  Administered 2016-10-26: 25 mg via ORAL

## 2016-10-26 MED ORDER — DOXYCYCLINE HYCLATE 100 MG PO CAPS: 100.0000 mg | ORAL_CAPSULE | Freq: Two times a day (BID) | ORAL | 0 refills | Status: DC

## 2016-10-26 MED ORDER — STERILE WATER FOR INJECTION IJ SOLN
INTRAMUSCULAR | Status: AC
  Filled 2016-10-26: qty 10

## 2016-10-26 MED ORDER — ONDANSETRON 4 MG PO TBDP
4.0000 mg | ORAL_TABLET | Freq: Once | ORAL | Status: AC
  Administered 2016-10-26: 4 mg via ORAL

## 2016-10-26 MED ORDER — CEFTRIAXONE SODIUM 250 MG IJ SOLR
250.0000 mg | Freq: Once | INTRAMUSCULAR | Status: AC
  Administered 2016-10-26: 250 mg via INTRAMUSCULAR

## 2016-10-26 MED ORDER — CEFTRIAXONE SODIUM 250 MG IJ SOLR
INTRAMUSCULAR | Status: AC
  Filled 2016-10-26: qty 250

## 2016-10-26 NOTE — ED Notes (Signed)
Pt ambulatory and independent at discharge.  Verbalized understanding of discharge instructions 

## 2016-10-26 NOTE — Discharge Instructions (Signed)
Please see your primary doctor or [facility] for further help. Return to the ER if the symptoms get worse.

## 2016-10-26 NOTE — ED Provider Notes (Signed)
[facility] DEPT Provider Note   CSN: [id number] Arrival date & time: 10/25/16  2242     History   Chief Complaint Chief Complaint  Patient presents with  . Pseudo-seizure    HPI [name] is a 21 y.o. female.  HPI Pt with hx of HTN, Anxiety, Pseudoseizure comes in after a seizure. Per patient, she was talking to her family when she had sudden onset severe R sided lateral abd pain that radiated all over her body. Pt's body went into shock and she lost consciousness and doesn't remember what happened thereafter. Patient's fiance is at bedside and he reports that his mother witnessed the episode. Pt thinks that her symptoms are due to anxiety. Pt denies incontinence or tongue biting.  Past Medical History:  Diagnosis Date  . Anxiety   . Diabetes mellitus without complication ([place])    pre diabetic  . Heart murmur   . Hypertension     There are no active problems to display for this patient.   History reviewed. No pertinent surgical history.  OB History    Gravida Para Term Preterm AB Living   1             SAB TAB Ectopic Multiple Live Births                   Home Medications    Prior to Admission medications   Medication Sig Start Date End Date Taking? Authorizing Provider  acetaminophen (TYLENOL) 325 MG tablet Take 650 mg by mouth every 6 (six) hours as needed for moderate pain or headache.   Yes [provider]  doxycycline (VIBRAMYCIN) 100 MG capsule Take 1 capsule (100 mg total) by mouth 2 (two) times daily. 10/26/16   Varney Biles, MD  hydrOXYzine (ATARAX/VISTARIL) 50 MG tablet Take 1 tablet (50 mg total) by mouth every 6 (six) hours as needed for anxiety. 10/19/16   Street, Mercedes, PA-C  ondansetron (ZOFRAN) 4 MG tablet Take 1 tablet (4 mg total) by mouth every 6 (six) hours. Patient not taking: Reported on 10/19/2016 06/10/16   Junius Creamer, NP  pantoprazole (PROTONIX) 20 MG tablet Take 1 tablet (20 mg total) by mouth daily. 10/18/16    Muthersbaugh, Jarrett Soho, PA-C  Prenatal Vit-Fe Fumarate-FA (PRENATAL COMPLETE) 14-0.4 MG TABS Take 2 tablets by mouth daily. 10/18/16   Muthersbaugh, Jarrett Soho, PA-C    Family History Family History  Problem Relation Age of Onset  . Diabetes Other     Social History Social History  Substance Use Topics  . Smoking status: Never Smoker  . Smokeless tobacco: Never Used  . Alcohol use No     Allergies   Azithromycin   Review of Systems Review of Systems   Physical Exam Updated Vital Signs BP 116/65 (BP Location: Left Arm)   Pulse 62   Temp 98.1 F (36.7 C) (Oral)   Resp 18   SpO2 100%   Physical Exam  Constitutional: She is oriented to person, place, and time. She appears well-developed.  HENT:  Head: Normocephalic and atraumatic.  Eyes: Pupils are equal, round, and reactive to light. Conjunctivae and EOM are normal.  Neck: Normal range of motion. Neck supple.  Cardiovascular: Normal rate, regular rhythm, normal heart sounds and intact distal pulses.   No murmur heard. Pulmonary/Chest: Effort normal. No respiratory distress. She has no wheezes.  Abdominal: Soft. Bowel sounds are normal. She exhibits no distension. There is no rebound and no guarding.  Genitourinary: Vagina normal and uterus normal.  Genitourinary Comments: External exam - normal, no lesions Speculum exam: Pt has some white discharge, no blood Bimanual exam: Patient has CMT, no adnexal tenderness or fullness and cervical os is closed  Neurological: She is alert and oriented to person, place, and time.  Skin: Skin is warm and dry.  Nursing note and vitals reviewed.    ED Treatments / Results  Labs (all labs ordered are listed, but only abnormal results are displayed) Labs Reviewed  WET PREP, GENITAL - Abnormal; Notable for the following:       Result Value   WBC, Wet Prep HPF POC FEW (*)    All other components within normal limits  CBC - Abnormal; Notable for the following:    Hemoglobin 11.7 (*)     HCT 34.3 (*)    All other components within normal limits  COMPREHENSIVE METABOLIC PANEL - Abnormal; Notable for the following:    CO2 21 (*)    Calcium 8.6 (*)    All other components within normal limits  I-STAT BETA HCG BLOOD, ED ([facility], [facility], [facility] ONLY)  GC/CHLAMYDIA PROBE AMP ([place]) NOT AT [facility]    EKG  EKG Interpretation None       Radiology No results found.  Procedures Procedures (including critical care time)  Medications Ordered in ED Medications  cefTRIAXone (ROCEPHIN) injection 250 mg (not administered)  azithromycin (ZITHROMAX) tablet 1,000 mg (not administered)  ondansetron (ZOFRAN-ODT) disintegrating tablet 4 mg (not administered)  hydrOXYzine (ATARAX/VISTARIL) tablet 25 mg (not administered)  acetaminophen (TYLENOL) tablet 650 mg (650 mg Oral Given 10/25/16 2342)  ondansetron (ZOFRAN-ODT) disintegrating tablet 4 mg (4 mg Oral Given 10/25/16 2342)     Initial Impression / Assessment and Plan / ED Course  I have reviewed the triage vital signs and the nursing notes.  Pertinent labs & imaging results that were available during my care of the patient were reviewed by me and considered in my medical decision making (see chart for details).  Clinical Course as of Oct 27 119  Fri Oct 26, 2016  0112 As I walked in to the room for the pelvic exam, pt was sitting up and having generalized body tremor, and her right upper extremity was supporting her balance. I asked patent to focus on breathing and to relax, to which she nodded. I then informed the patient that the chaperone was in the room and we would like to start the pelvic exam, and she slowly set herself up on the stirrups and when I completed the pelvic exam patient was back to normal.  [AN]    Clinical Course User Index [AN] Varney Biles, MD   Pt comes in with seizure like activity. She has hx of anxiety and seizure like activity, not formally diagnosed with seizures and it seems like her episode was  pseudoseizure after I witnessed the episode. Pt has pelvic pain. She will get meds to cover for STD and doxy for PID.  [facility] wellness resources provided. Strict ER return precautions have been discussed, and patient is agreeing with the plan and is comfortable with the workup done and the recommendations from the ER.     Final Clinical Impressions(s) / ED Diagnoses   Final diagnoses:  History of pseudoseizure  Anxiety disorder, unspecified type  Pelvic pain    New Prescriptions New Prescriptions   DOXYCYCLINE (VIBRAMYCIN) 100 MG CAPSULE    Take 1 capsule (100 mg total) by mouth 2 (two) times daily.     Varney Biles, MD 10/26/16  0121  

## 2016-10-26 NOTE — ED Notes (Signed)
Upon entering this patient's room patient would not respond to this nurse and friend at bedside stated she was seizing. Patient lying still in bed.  No evident seizure activity noticed.  Made MD aware. MD now at bedside.

## 2016-10-29 LAB — GC/CHLAMYDIA PROBE AMP (~~LOC~~) NOT AT ARMC
Chlamydia: POSITIVE — AB
NEISSERIA GONORRHEA: NEGATIVE

## 2016-11-06 ENCOUNTER — Emergency Department (HOSPITAL_COMMUNITY): Payer: Self-pay

## 2016-11-06 ENCOUNTER — Encounter (HOSPITAL_COMMUNITY): Payer: Self-pay

## 2016-11-06 ENCOUNTER — Emergency Department (HOSPITAL_COMMUNITY)
Admission: EM | Admit: 2016-11-06 | Discharge: 2016-11-06 | Disposition: A | Discharge status: Home / Self Care | Payer: Self-pay | Attending: Emergency Medicine | Admitting: Emergency Medicine

## 2016-11-06 DIAGNOSIS — F419 Anxiety disorder, unspecified: Secondary | ICD-10-CM | POA: Insufficient documentation

## 2016-11-06 DIAGNOSIS — R7303 Prediabetes: Secondary | ICD-10-CM | POA: Insufficient documentation

## 2016-11-06 DIAGNOSIS — Y939 Activity, unspecified: Secondary | ICD-10-CM | POA: Insufficient documentation

## 2016-11-06 DIAGNOSIS — W19XXXA Unspecified fall, initial encounter: Secondary | ICD-10-CM

## 2016-11-06 DIAGNOSIS — Y929 Unspecified place or not applicable: Secondary | ICD-10-CM | POA: Insufficient documentation

## 2016-11-06 DIAGNOSIS — W1830XA Fall on same level, unspecified, initial encounter: Secondary | ICD-10-CM | POA: Insufficient documentation

## 2016-11-06 DIAGNOSIS — R51 Headache: Secondary | ICD-10-CM | POA: Insufficient documentation

## 2016-11-06 DIAGNOSIS — I10 Essential (primary) hypertension: Secondary | ICD-10-CM | POA: Insufficient documentation

## 2016-11-06 DIAGNOSIS — Z79899 Other long term (current) drug therapy: Secondary | ICD-10-CM | POA: Insufficient documentation

## 2016-11-06 DIAGNOSIS — Y999 Unspecified external cause status: Secondary | ICD-10-CM | POA: Insufficient documentation

## 2016-11-06 LAB — I-STAT CHEM 8, ED
BUN: 12 mg/dL (ref 6–20)
CALCIUM ION: 1.18 mmol/L (ref 1.15–1.40)
CREATININE: 0.6 mg/dL (ref 0.44–1.00)
Chloride: 104 mmol/L (ref 101–111)
Glucose, Bld: 88 mg/dL (ref 65–99)
HEMATOCRIT: 36 % (ref 36.0–46.0)
Hemoglobin: 12.2 g/dL (ref 12.0–15.0)
POTASSIUM: 3.4 mmol/L — AB (ref 3.5–5.1)
Sodium: 141 mmol/L (ref 135–145)
TCO2: 25 mmol/L (ref 22–32)

## 2016-11-06 LAB — I-STAT BETA HCG BLOOD, ED (MC, WL, AP ONLY)

## 2016-11-06 NOTE — Discharge Instructions (Signed)
You were evaluated in the ED after a recent fall where you lost consciousness and hit your head. Your labwork was reassuring that you do not have any electrolyte abnormalities or infection. Your pregnancy test was negative and your EKG did not reveal any abnormalities. Your head CT did not show evidence of acute fracture. Please take Tylenol 650 mg up to 4 times a day or Ibuprofen 600 mg up to 4 times a day as needed for your headache.  Please follow up with your primary care physician regarding your recent ED visit.
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
## 2017-04-30 ENCOUNTER — Other Ambulatory Visit: Payer: Self-pay

## 2017-04-30 ENCOUNTER — Encounter (HOSPITAL_COMMUNITY): Payer: Self-pay | Admitting: *Deleted

## 2017-04-30 ENCOUNTER — Inpatient Hospital Stay (HOSPITAL_COMMUNITY)
Admission: AD | Admit: 2017-04-30 | Discharge: 2017-04-30 | Disposition: A | Discharge status: Home / Self Care | Payer: Medicaid Other | Source: Ambulatory Visit | Attending: Family Medicine | Admitting: Family Medicine

## 2017-04-30 DIAGNOSIS — O132 Gestational [pregnancy-induced] hypertension without significant proteinuria, second trimester: Secondary | ICD-10-CM | POA: Insufficient documentation

## 2017-04-30 DIAGNOSIS — Z3A22 22 weeks gestation of pregnancy: Secondary | ICD-10-CM | POA: Insufficient documentation

## 2017-04-30 DIAGNOSIS — Z7982 Long term (current) use of aspirin: Secondary | ICD-10-CM | POA: Diagnosis not present

## 2017-04-30 DIAGNOSIS — R51 Headache: Secondary | ICD-10-CM | POA: Diagnosis not present

## 2017-04-30 DIAGNOSIS — O24912 Unspecified diabetes mellitus in pregnancy, second trimester: Secondary | ICD-10-CM | POA: Insufficient documentation

## 2017-04-30 DIAGNOSIS — Z881 Allergy status to other antibiotic agents status: Secondary | ICD-10-CM | POA: Diagnosis not present

## 2017-04-30 DIAGNOSIS — O26892 Other specified pregnancy related conditions, second trimester: Secondary | ICD-10-CM | POA: Insufficient documentation

## 2017-04-30 DIAGNOSIS — R519 Headache, unspecified: Secondary | ICD-10-CM

## 2017-04-30 LAB — COMPREHENSIVE METABOLIC PANEL
ALBUMIN: 3 g/dL — AB (ref 3.5–5.0)
ALT: 13 U/L — ABNORMAL LOW (ref 14–54)
AST: 14 U/L — AB (ref 15–41)
Alkaline Phosphatase: 56 U/L (ref 38–126)
Anion gap: 7 (ref 5–15)
BILIRUBIN TOTAL: 0.4 mg/dL (ref 0.3–1.2)
BUN: 7 mg/dL (ref 6–20)
CALCIUM: 8.3 mg/dL — AB (ref 8.9–10.3)
CO2: 21 mmol/L — ABNORMAL LOW (ref 22–32)
Chloride: 107 mmol/L (ref 101–111)
Creatinine, Ser: 0.41 mg/dL — ABNORMAL LOW (ref 0.44–1.00)
GFR calc Af Amer: >60 mL/min (ref >60)
GFR calc non Af Amer: >60 mL/min (ref >60)
GLUCOSE: 84 mg/dL (ref 65–99)
POTASSIUM: 3.4 mmol/L — AB (ref 3.5–5.1)
Sodium: 135 mmol/L (ref 135–145)
TOTAL PROTEIN: 6.6 g/dL (ref 6.5–8.1)

## 2017-04-30 LAB — URINALYSIS, ROUTINE W REFLEX MICROSCOPIC
BACTERIA UA: NONE SEEN
Bilirubin Urine: NEGATIVE
Glucose, UA: NEGATIVE mg/dL
HGB URINE DIPSTICK: NEGATIVE
KETONES UR: NEGATIVE mg/dL
NITRITE: NEGATIVE
PROTEIN: NEGATIVE mg/dL
Specific Gravity, Urine: 1.026 (ref 1.005–1.030)
pH: 7 (ref 5.0–8.0)

## 2017-04-30 LAB — PROTEIN / CREATININE RATIO, URINE
Creatinine, Urine: 184 mg/dL
Protein Creatinine Ratio: 0.08 mg/mg{Cre} (ref 0.00–0.15)
Total Protein, Urine: 14 mg/dL

## 2017-04-30 LAB — CBC
HEMATOCRIT: 31.6 % — AB (ref 36.0–46.0)
HEMOGLOBIN: 10.7 g/dL — AB (ref 12.0–15.0)
MCH: 29.4 pg (ref 26.0–34.0)
MCHC: 33.9 g/dL (ref 30.0–36.0)
MCV: 86.8 fL (ref 78.0–100.0)
Platelets: 145 10*3/uL — ABNORMAL LOW (ref 150–400)
RBC: 3.64 MIL/uL — AB (ref 3.87–5.11)
RDW: 12.6 % (ref 11.5–15.5)
WBC: 5.9 10*3/uL (ref 4.0–10.5)

## 2017-04-30 MED ORDER — BUTALBITAL-APAP-CAFFEINE 50-325-40 MG PO TABS
1.0000 | ORAL_TABLET | Freq: Once | ORAL | Status: AC
Start: 2017-04-30 — End: 2017-04-30
  Administered 2017-04-30: 1 via ORAL
  Filled 2017-04-30: qty 1

## 2017-04-30 NOTE — Progress Notes (Addendum)
Pt states ha gone.   Provider at [facility] reassessing.  Provider made aware.   Pt informed regards to moving to [place] area. OB list in area given.   D/c instructions given with pt understanding. Pt left unit via ambulaltory

## 2017-04-30 NOTE — MAU Provider Note (Addendum)
History     CSN: [id number]  Arrival date and time: 04/30/17 1919   First Provider Initiated Contact with Patient 04/30/17 2031      Chief Complaint  Patient presents with  . Headache   HPI Margaret Ingram is a 22 y.o. G2P1001 at [redacted]w[redacted]d who presents with headache and elevated blood pressure. She states at 5pm she developed a headache that she rates a 7/10 and did not try any medication for. She also reports seeing black floaters with the onset of the headache. No epigastric pain. She states she then called EMS who told her that her blood pressure was elevated. She denies any epigastric pain. She has not eaten or drank anything today.  She reports a history of preeclampsia with her first pregnancy. Gets prenatal care in [facility]   OB History    Gravida Para Term Preterm AB Living   2 1 1     1    SAB TAB Ectopic Multiple Live Births           1      Past Medical History:  Diagnosis Date  . Anxiety   . Diabetes mellitus without complication ([place])    pre diabetic  . Heart murmur   . Hypertension     History reviewed. No pertinent surgical history.  Family History  Problem Relation Age of Onset  . Diabetes Other     Social History   Tobacco Use  . Smoking status: Never Smoker  . Smokeless tobacco: Never Used  Substance Use Topics  . Alcohol use: No  . Drug use: No    Comment: "not since I got pregnant"    Allergies:  Allergies  Allergen Reactions  . Azithromycin Nausea And Vomiting    Medications Prior to Admission  Medication Sig Dispense Refill Last Dose  . acetaminophen (TYLENOL) 325 MG tablet Take 650 mg by mouth every 6 (six) hours as needed for moderate pain or headache.   Past Week at Unknown time  . aspirin 81 MG chewable tablet Chew 81 mg by mouth daily.   Past Month at Unknown time  . ondansetron (ZOFRAN) 4 MG tablet Take 4 mg by mouth every 8 (eight) hours as needed for nausea.    Past Week at Unknown time  . Prenatal Vit-Fe Fumarate-FA  (PRENATAL COMPLETE) 14-0.4 MG TABS Take 2 tablets by mouth daily. 67 each 0 Dec at Unknown time  . doxycycline (VIBRAMYCIN) 100 MG capsule Take 1 capsule (100 mg total) by mouth 2 (two) times daily. (Patient not taking: Reported on 11/06/2016) 20 capsule 0 Completed Course at Unknown time  . hydrOXYzine (ATARAX/VISTARIL) 50 MG tablet Take 1 tablet (50 mg total) by mouth every 6 (six) hours as needed for anxiety. (Patient not taking: Reported on 04/30/2017) 40 tablet 0 Not Taking at Unknown time    Review of Systems  Constitutional: Negative.  Negative for fatigue and fever.  HENT: Negative.   Eyes: Positive for visual disturbance.  Respiratory: Negative.  Negative for shortness of breath.   Cardiovascular: Negative.  Negative for chest pain.  Gastrointestinal: Negative.  Negative for abdominal pain, constipation, diarrhea, nausea and vomiting.  Genitourinary: Negative.  Negative for decreased urine volume, dysuria and vaginal bleeding.  Neurological: Positive for headaches. Negative for dizziness.   Physical Exam   Blood pressure 138/69, pulse 76, temperature 98.6 F (37 C), temperature source Oral, resp. rate 18, height 5\' 2"  (1.575 m), weight 225 lb 12 oz (102.4 kg), last menstrual period 10/22/2016, unknown  if currently breastfeeding.  Patient Vitals for the past 24 hrs:  BP Temp Temp src Pulse Resp Height Weight  04/30/17 2131 138/69 - - 76 - - -  04/30/17 2116 139/85 - - 85 - - -  04/30/17 2100 135/75 - - 73 - - -  04/30/17 2046 133/69 - - 74 - - -  04/30/17 2032 (!) 147/83 - - 89 - - -  04/30/17 2031 128/72 - - 77 - - -  04/30/17 1940 (!) 141/68 98.6 F (37 C) Oral 88 18 5\' 2"  (1.575 m) 225 lb 12 oz (102.4 kg)    Physical Exam  Nursing note and vitals reviewed. Constitutional: She is oriented to person, place, and time. She appears well-developed and well-nourished. No distress.  HENT:  Head: Normocephalic.  Eyes: Pupils are equal, round, and reactive to light.   Cardiovascular: Normal rate, regular rhythm and normal heart sounds.  Respiratory: Effort normal and breath sounds normal. No respiratory distress.  GI: Soft. Bowel sounds are normal. She exhibits no distension. There is no tenderness.  Neurological: She is alert and oriented to person, place, and time. She has normal reflexes. No cranial nerve deficit.  Skin: Skin is warm and dry.  Psychiatric: She has a normal mood and affect. Her behavior is normal. Judgment and thought content normal.  FHT: 135 bpm  Results for orders placed or performed during the hospital encounter of 04/30/17 (from the past 24 hour(s))  Urinalysis, Routine w reflex microscopic     Status: Abnormal   Collection Time: 04/30/17  7:42 PM  Result Value Ref Range   Color, Urine YELLOW YELLOW   APPearance HAZY (A) CLEAR   Specific Gravity, Urine 1.026 1.005 - 1.030   pH 7.0 5.0 - 8.0   Glucose, UA NEGATIVE NEGATIVE mg/dL   Hgb urine dipstick NEGATIVE NEGATIVE   Bilirubin Urine NEGATIVE NEGATIVE   Ketones, ur NEGATIVE NEGATIVE mg/dL   Protein, ur NEGATIVE NEGATIVE mg/dL   Nitrite NEGATIVE NEGATIVE   Leukocytes, UA TRACE (A) NEGATIVE   RBC / HPF 0-5 0 - 5 RBC/hpf   WBC, UA 6-30 0 - 5 WBC/hpf   Bacteria, UA NONE SEEN NONE SEEN   Squamous Epithelial / LPF 6-30 (A) NONE SEEN   Mucus PRESENT   Protein / creatinine ratio, urine     Status: None   Collection Time: 04/30/17  7:42 PM  Result Value Ref Range   Creatinine, Urine 184.00 mg/dL   Total Protein, Urine 14 mg/dL   Protein Creatinine Ratio 0.08 0.00 - 0.15 mg/mg[Cre]  CBC     Status: Abnormal   Collection Time: 04/30/17  8:19 PM  Result Value Ref Range   WBC 5.9 4.0 - 10.5 K/uL   RBC 3.64 (L) 3.87 - 5.11 MIL/uL   Hemoglobin 10.7 (L) 12.0 - 15.0 g/dL   HCT 31.6 (L) 36.0 - 46.0 %   MCV 86.8 78.0 - 100.0 fL   MCH 29.4 26.0 - 34.0 pg   MCHC 33.9 30.0 - 36.0 g/dL   RDW 12.6 11.5 - 15.5 %   Platelets 145 (L) 150 - 400 K/uL  Comprehensive metabolic panel      Status: Abnormal   Collection Time: 04/30/17  8:19 PM  Result Value Ref Range   Sodium 135 135 - 145 mmol/L   Potassium 3.4 (L) 3.5 - 5.1 mmol/L   Chloride 107 101 - 111 mmol/L   CO2 21 (L) 22 - 32 mmol/L   Glucose, Bld 84 65 -  99 mg/dL   BUN 7 6 - 20 mg/dL   Creatinine, Ser 0.41 (L) 0.44 - 1.00 mg/dL   Calcium 8.3 (L) 8.9 - 10.3 mg/dL   Total Protein 6.6 6.5 - 8.1 g/dL   Albumin 3.0 (L) 3.5 - 5.0 g/dL   AST 14 (L) 15 - 41 U/L   ALT 13 (L) 14 - 54 U/L   Alkaline Phosphatase 56 38 - 126 U/L   Total Bilirubin 0.4 0.3 - 1.2 mg/dL   GFR calc non Af Amer >60 >60 mL/min   GFR calc Af Amer >60 >60 mL/min   Anion gap 7 5 - 15   MAU Course  Procedures  MDM UA CBC, CMP, Protein/creat ratio Fioricet PO   Care turned over to [facility]. Kaliyan Osbourn CNM at 2045. BPs initially mildly elevated then became normotensive. HA resolved after med, could have been caused by poor nutrition/hydration. No evidence of pre-e at this time but discussed with pt she is higher risk of developing since hx of in last pregnancy. Pt reports planning to move to [place], I encouraged her to continue PN appt's in [place] until she has an appt with on OB here in order to avoid lapse in care. Stable for discharge home.  Assessment and Plan   1. [redacted] weeks gestation of pregnancy   2. Transient hypertension of pregnancy in second trimester   3. Pregnancy headache in second trimester    Discharge home Follow up with primary OB this week as scheduled Pre-e precautions  Allergies as of 04/30/2017      Reactions   Azithromycin Nausea And Vomiting      Medication List    STOP taking these medications   doxycycline 100 MG capsule Commonly known as:  VIBRAMYCIN   hydrOXYzine 50 MG tablet Commonly known as:  ATARAX/VISTARIL     TAKE these medications   acetaminophen 325 MG tablet Commonly known as:  TYLENOL Take 650 mg by mouth every 6 (six) hours as needed for moderate pain or headache.   aspirin 81 MG chewable tablet Chew 81  mg by mouth daily.   ondansetron 4 MG tablet Commonly known as:  ZOFRAN Take 4 mg by mouth every 8 (eight) hours as needed for nausea.   PRENATAL COMPLETE 14-0.4 MG Tabs Take 2 tablets by mouth daily.      Julianne Handler, CNM  04/30/2017 10:02 PM

## 2017-04-30 NOTE — Discharge Instructions (Signed)
Dehydration, Adult Dehydration is when there is not enough fluid or water in your body. This happens when you lose more fluids than you take in. Dehydration can range from mild to very bad. It should be treated right away to keep it from getting very bad. Symptoms of mild dehydration may include:  Thirst.  Dry lips.  Slightly dry mouth.  Dry, warm skin.  Dizziness. Symptoms of moderate dehydration may include:  Very dry mouth.  Muscle cramps.  Dark pee (urine). Pee may be the color of tea.  Your body making less pee.  Your eyes making fewer tears.  Heartbeat that is uneven or faster than normal (palpitations).  Headache.  Light-headedness, especially when you stand up from sitting.  Fainting (syncope). Symptoms of very bad dehydration may include:  Changes in skin, such as: ? Cold and clammy skin. ? Blotchy (mottled) or pale skin. ? Skin that does not quickly return to normal after being lightly pinched and let go (poor skin turgor).  Changes in body fluids, such as: ? Feeling very thirsty. ? Your eyes making fewer tears. ? Not sweating when body temperature is high, such as in hot weather. ? Your body making very little pee.  Changes in vital signs, such as: ? Weak pulse. ? Pulse that is more than 100 beats a minute when you are sitting still. ? Fast breathing. ? Low blood pressure.  Other changes, such as: ? Sunken eyes. ? Cold hands and feet. ? Confusion. ? Lack of energy (lethargy). ? Trouble waking up from sleep. ? Short-term weight loss. ? Unconsciousness. Follow these instructions at home:  If told by your doctor, drink an ORS: ? Make an ORS by using instructions on the package. ? Start by drinking small amounts, about  cup (120 mL) every 5-10 minutes. ? Slowly drink more until you have had the amount that your doctor said to have.  Drink enough clear fluid to keep your pee clear or pale yellow. If you were told to drink an ORS, finish the ORS  first, then start slowly drinking clear fluids. Drink fluids such as: ? Water. Do not drink only water by itself. Doing that can make the salt (sodium) level in your body get too low (hyponatremia). ? Ice chips. ? Fruit juice that you have added water to (diluted). ? Low-calorie sports drinks.  Avoid: ? Alcohol. ? Drinks that have a lot of sugar. These include high-calorie sports drinks, fruit juice that does not have water added, and soda. ? Caffeine. ? Foods that are greasy or have a lot of fat or sugar.  Take over-the-counter and prescription medicines only as told by your doctor.  Do not take salt tablets. Doing that can make the salt level in your body get too high (hypernatremia).  Eat foods that have minerals (electrolytes). Examples include bananas, oranges, potatoes, tomatoes, and spinach.  Keep all follow-up visits as told by your doctor. This is important. Contact a doctor if:  You have belly (abdominal) pain that: ? Gets worse. ? Stays in one area (localizes).  You have a rash.  You have a stiff neck.  You get angry or annoyed more easily than normal (irritability).  You are more sleepy than normal.  You have a harder time waking up than normal.  You feel: ? Weak. ? Dizzy. ? Very thirsty.  You have peed (urinated) only a small amount of very dark pee during 6-8 hours. Get help right away if:  You have symptoms of  very bad dehydration.  You cannot drink fluids without throwing up (vomiting).  Your symptoms get worse with treatment.  You have a fever.  You have a very bad headache.  You are throwing up or having watery poop (diarrhea) and it: ? Gets worse. ? Does not go away.  You have blood or something green (bile) in your throw-up.  You have blood in your poop (stool). This may cause poop to look black and tarry.  You have not peed in 6-8 hours.  You pass out (faint).  Your heart rate when you are sitting still is more than 100 beats a  minute.  You have trouble breathing. This information is not intended to replace advice given to you by your health care provider. Make sure you discuss any questions you have with your health care provider. Document Released: 12/02/2008 Document Revised: 08/26/2015 Document Reviewed: 04/01/2015 Elsevier Interactive Patient Education  2018 [employer] for Pregnant Women While you are pregnant, your body will require additional nutrition to help support your growing baby. It is recommended that you consume:  150 additional calories each day during your first trimester.  300 additional calories each day during your second trimester.  300 additional calories each day during your third trimester.  Eating a healthy, well-balanced diet is very important for your health and for your baby's health. You also have a higher need for some vitamins and minerals, such as folic acid, calcium, iron, and vitamin D. What do I need to know about eating during pregnancy?  Do not try to lose weight or go on a diet during pregnancy.  Choose healthy, nutritious foods. Choose  of a sandwich with a glass of milk instead of a candy bar or a high-calorie sugar-sweetened beverage.  Limit your overall intake of foods that have "empty calories." These are foods that have little nutritional value, such as sweets, desserts, candies, sugar-sweetened beverages, and fried foods.  Eat a variety of foods, especially fruits and vegetables.  Take a prenatal vitamin to help meet the additional needs during pregnancy, specifically for folic acid, iron, calcium, and vitamin D.  Remember to stay active. Ask your health care provider for exercise recommendations that are specific to you.  Practice good food safety and cleanliness, such as washing your hands before you eat and after you prepare raw meat. This helps to prevent foodborne illnesses, such as listeriosis, that can be very dangerous for your baby. Ask  your health care provider for more information about listeriosis. What does 150 extra calories look like? Healthy options for an additional 150 calories each day could be any of the following:  Plain low-fat yogurt (6-8 oz) with  cup of berries.  1 apple with 2 teaspoons of peanut butter.  Cut-up vegetables with  cup of hummus.  Low-fat chocolate milk (8 oz or 1 cup).  1 string cheese with 1 medium orange.   of a peanut butter and jelly sandwich on whole-wheat bread (1 tsp of peanut butter).  For 300 calories, you could eat two of those healthy options each day. What is a healthy amount of weight to gain? The recommended amount of weight for you to gain is based on your pre-pregnancy BMI. If your pre-pregnancy BMI was:  Less than 18 (underweight), you should gain 28-40 lb.  18-24.9 (normal), you should gain 25-35 lb.  25-29.9 (overweight), you should gain 15-25 lb.  Greater than 30 (obese), you should gain 11-20 lb.  What if I am having  twins or multiples? Generally, pregnant women who will be having twins or multiples may need to increase their daily calories by 300-600 calories each day. The recommended range for total weight gain is 25-54 lb, depending on your pre-pregnancy BMI. Talk with your health care provider for specific guidance about additional nutritional needs, weight gain, and exercise during your pregnancy. What foods can I eat? Grains Any grains. Try to choose whole grains, such as whole-wheat bread, oatmeal, or brown rice. Vegetables Any vegetables. Try to eat a variety of colors and types of vegetables to get a full range of vitamins and minerals. Remember to wash your vegetables well before eating. Fruits Any fruits. Try to eat a variety of colors and types of fruit to get a full range of vitamins and minerals. Remember to wash your fruits well before eating. Meats and Other Protein Sources Lean meats, including chicken, [place], fish, and lean cuts of beef,  veal, or pork. Make sure that all meats are cooked to "well done." Tofu. Tempeh. Beans. Eggs. Peanut butter and other nut butters. Seafood, such as shrimp, crab, and lobster. If you choose fish, select types that are higher in omega-3 fatty acids, including salmon, herring, mussels, trout, sardines, and pollock. Make sure that all meats are cooked to food-safe temperatures. Dairy Pasteurized milk and milk alternatives. Pasteurized yogurt and pasteurized cheese. Cottage cheese. Sour cream. Beverages Water. Juices that contain 100% fruit juice or vegetable juice. Caffeine-free teas and decaffeinated coffee. Drinks that contain caffeine are okay to drink, but it is better to avoid caffeine. Keep your total caffeine intake to less than 200 mg each day (12 oz of coffee, tea, or soda) or as directed by your health care provider. Condiments Any pasteurized condiments. Sweets and Desserts Any sweets and desserts. Fats and Oils Any fats and oils. The items listed above may not be a complete list of recommended foods or beverages. Contact your dietitian for more options. What foods are not recommended? Vegetables Unpasteurized (raw) vegetable juices. Fruits Unpasteurized (raw) fruit juices. Meats and Other Protein Sources Cured meats that have nitrates, such as bacon, salami, and hotdogs. Luncheon meats, bologna, or other deli meats (unless they are reheated until they are steaming hot). Refrigerated pate, meat spreads from a meat counter, smoked seafood that is found in the refrigerated section of a store. Raw fish, such as sushi or sashimi. High mercury content fish, such as tilefish, shark, swordfish, and king mackerel. Raw meats, such as tuna or beef tartare. Undercooked meats and poultry. Make sure that all meats are cooked to food-safe temperatures. Dairy Unpasteurized (raw) milk and any foods that have raw milk in them. Soft cheeses, such as feta, queso blanco, queso fresco, Brie, Camembert cheeses,  blue-veined cheeses, and Panela cheese (unless it is made with pasteurized milk, which must be stated on the label). Beverages Alcohol. Sugar-sweetened beverages, such as sodas, teas, or energy drinks. Condiments Homemade fermented foods and drinks, such as pickles, sauerkraut, or kombucha drinks. (Store-bought pasteurized versions of these are okay.) Other Salads that are made in the store, such as ham salad, chicken salad, egg salad, tuna salad, and seafood salad. The items listed above may not be a complete list of foods and beverages to avoid. Contact your dietitian for more information. This information is not intended to replace advice given to you by your health care provider. Make sure you discuss any questions you have with your health care provider. Document Released: 11/20/2013 Document Revised: 07/14/2015 Document Reviewed: 07/21/2013 Elsevier Interactive  Patient Education  [employer]. Hypertension During Pregnancy Hypertension is also called high blood pressure. High blood pressure means that the force of your blood moving in your body is too strong. When you are pregnant, this condition should be watched carefully. It can cause problems for you and your baby. Follow these instructions at home: Eating and drinking  Drink enough fluid to keep your pee (urine) clear or pale yellow.  Eat healthy foods that are low in salt (sodium). ? Do not add salt to your food. ? Check labels on foods and drinks to see much salt is in them. Look on the label where you see "Sodium." Lifestyle  Do not use any products that contain nicotine or tobacco, such as cigarettes and e-cigarettes. If you need help quitting, ask your doctor.  Do not use alcohol.  Avoid caffeine.  Avoid stress. Rest and get plenty of sleep. General instructions  Take over-the-counter and prescription medicines only as told by your doctor.  While lying down, lie on your left side. This keeps pressure off your  baby.  While sitting or lying down, raise (elevate) your feet. Try putting some pillows under your lower legs.  Exercise regularly. Ask your doctor what kinds of exercise are best for you.  Keep all prenatal and follow-up visits as told by your doctor. This is important. Contact a doctor if:  You have symptoms that your doctor told you to watch for, such as: ? Fever. ? Throwing up (vomiting). ? Headache. Get help right away if:  You have very bad pain in your belly (abdomen).  You are throwing up, and this does not get better with treatment.  You suddenly get swelling in your hands, ankles, or face.  You gain 4 lb (1.8 kg) or more in 1 week.  You get bleeding from your vagina.  You have blood in your pee.  You do not feel your baby moving as much as normal.  You have a change in vision.  You have muscle twitching or sudden tightening (spasms).  You have trouble breathing.  Your lips or fingernails turn blue. This information is not intended to replace advice given to you by your health care provider. Make sure you discuss any questions you have with your health care provider. Document Released: 03/10/2010 Document Revised: 10/18/2015 Document Reviewed: 10/18/2015 Elsevier Interactive Patient Education  [employer].

## 2017-04-30 NOTE — MAU Note (Signed)
ARRIVED VIA EMS  -PT  SAYS SHE WENT  TO [place] IN 2-15-  LAST VISIT-   [facility]  WITH [place].   HAS MOVED  HERE- WANTS TO TRANSFER CARE HERE.    HAS LOWER ABD CRAMPS - STARTED LAST NIGHT . - CONSTANT.   NO BLEEDING.Marland Kitchen   HAS H/A- STARTED AT 5PM-   NO MEDS.  BEFORE SHE  CALLED  EMS- SHE SAW BLACK   SPOTS.  WITH LAST PREG - HAD PRE-E.     LAST SEX-  FEB.

## 2017-05-20 ENCOUNTER — Encounter (HOSPITAL_COMMUNITY): Payer: Self-pay | Admitting: *Deleted

## 2017-05-20 ENCOUNTER — Other Ambulatory Visit: Payer: Self-pay

## 2017-05-20 ENCOUNTER — Inpatient Hospital Stay (HOSPITAL_COMMUNITY)
Admission: AD | Admit: 2017-05-20 | Discharge: 2017-05-21 | Disposition: A | Discharge status: Home / Self Care | Payer: Medicaid Other | Source: Ambulatory Visit | Attending: Obstetrics & Gynecology | Admitting: Obstetrics & Gynecology

## 2017-05-20 DIAGNOSIS — O26892 Other specified pregnancy related conditions, second trimester: Secondary | ICD-10-CM

## 2017-05-20 DIAGNOSIS — N898 Other specified noninflammatory disorders of vagina: Secondary | ICD-10-CM | POA: Diagnosis not present

## 2017-05-20 DIAGNOSIS — Z3A25 25 weeks gestation of pregnancy: Secondary | ICD-10-CM | POA: Insufficient documentation

## 2017-05-20 DIAGNOSIS — Z7982 Long term (current) use of aspirin: Secondary | ICD-10-CM | POA: Insufficient documentation

## 2017-05-20 LAB — AMNISURE RUPTURE OF MEMBRANE (ROM) NOT AT ARMC: Amnisure ROM: NEGATIVE

## 2017-05-20 NOTE — MAU Provider Note (Signed)
Chief Complaint:  Rupture of Membranes   First Provider Initiated Contact with Patient 05/20/17 2303     HPI: Margaret Ingram is a 22 y.o. G2P1001 at 59w1dwho presents to maternity admissions reporting leaking of fluid since 2200hrs.   States it was a long continuous leaking that stopped just before ambulance got there.  NO contractions. No bleeding. . She reports good fetal movement, denies vaginal bleeding, vaginal itching/burning, urinary symptoms, h/a, dizziness, n/v, diarrhea, constipation or fever/chills.    Gets care in [facility] but is trying to change to [place].    Vaginal Discharge  The patient's primary symptoms include vaginal discharge. The patient's pertinent negatives include no genital itching, genital lesions, genital odor, pelvic pain or vaginal bleeding. This is a new problem. The current episode started today. The problem occurs intermittently. The problem has been resolved. The patient is experiencing no pain. She is pregnant. Pertinent negatives include no abdominal pain, back pain, chills, constipation, diarrhea, fever, nausea or vomiting. The vaginal discharge was watery and clear. There has been no bleeding. She has not been passing clots. She has not been passing tissue. Nothing aggravates the symptoms. She has tried nothing for the symptoms.    Past Medical History: Past Medical History:  Diagnosis Date  . Anxiety   . Diabetes mellitus without complication ([place])    pre diabetic  . Heart murmur   . Hypertension     Past obstetric history: OB History  Gravida Para Term Preterm AB Living  2 1 1     1   SAB TAB Ectopic Multiple Live Births          1    # Outcome Date GA Lbr Len/2nd Weight Sex Delivery Anes PTL Lv  2 Current           1 Term 02/03/16    M Vag-Spont   LIV    Past Surgical History: No past surgical history on file.  Family History: Family History  Problem Relation Age of Onset  . Diabetes Other     Social History: Social  History   Tobacco Use  . Smoking status: Never Smoker  . Smokeless tobacco: Never Used  Substance Use Topics  . Alcohol use: No  . Drug use: No    Comment: "not since I got pregnant"    Allergies:  Allergies  Allergen Reactions  . Azithromycin Nausea And Vomiting    Meds:  Medications Prior to Admission  Medication Sig Dispense Refill Last Dose  . acetaminophen (TYLENOL) 325 MG tablet Take 650 mg by mouth every 6 (six) hours as needed for moderate pain or headache.   Past Week at Unknown time  . aspirin 81 MG chewable tablet Chew 81 mg by mouth daily.   Past Month at Unknown time  . ondansetron (ZOFRAN) 4 MG tablet Take 4 mg by mouth every 8 (eight) hours as needed for nausea.    Past Week at Unknown time  . Prenatal Vit-Fe Fumarate-FA (PRENATAL COMPLETE) 14-0.4 MG TABS Take 2 tablets by mouth daily. 39 each 0 Dec at Unknown time    I have reviewed patient's Past Medical Hx, Surgical Hx, Family Hx, Social Hx, medications and allergies.   ROS:  Review of Systems  Constitutional: Negative for chills and fever.  Gastrointestinal: Negative for abdominal pain, constipation, diarrhea, nausea and vomiting.  Genitourinary: Positive for vaginal discharge. Negative for pelvic pain.  Musculoskeletal: Negative for back pain.   Other systems negative  Physical Exam  No data  found. Constitutional: Well-developed, well-nourished female in no acute distress.  Cardiovascular: normal rate and rhythm Respiratory: normal effort, clear to auscultation bilaterally GI: Abd soft, non-tender, gravid appropriate for gestational age.   No rebound or guarding. MS: Extremities nontender, no edema, normal ROM Neurologic: Alert and oriented x 4.  GU: Neg CVAT.  PELVIC EXAM: Cervix pink, visually closed, without lesion, scant white creamy discharge, vaginal walls and external genitalia normal   NO POOLING, NO FERNING.  FHT:  Baseline 160 , moderate variability, accelerations present, no  decelerations Contractions:  Rare   Labs: Results for orders placed or performed during the hospital encounter of 05/20/17 (from the past 24 hour(s))  Amnisure rupture of membrane (rom)not at [facility]     Status: None   Collection Time: 05/20/17 11:32 PM  Result Value Ref Range   Amnisure ROM NEGATIVE    Imaging:  No results found.  MAU Course/MDM: I have ordered labs and reviewed results. Amnisure is negative.  NST reviewed and found reassuring for gestational age.  Treatments in MAU included Sterile speculum exam, EFM, amnisure (done when exam was negative) Discussed the leakage she had was not amniotic fluid.    Assessment: Single IUP at [redacted]w[redacted]d Vaginal discharge No evidence of PPROM  Plan: Discharge home Preterm Labor precautions and fetal kick counts Follow up in Office for prenatal visits and recheck of status  Encouraged to return here or to other Urgent Care/ED if she develops worsening of symptoms, increase in pain, fever, or other concerning symptoms.   Pt stable at time of discharge.  Hansel Feinstein CNM, MSN Certified Nurse-Midwife 05/20/2017 11:03 PM

## 2017-05-20 NOTE — MAU Note (Signed)
Pt reports gush of clear fluid when sitting in a chair this evening. No leaking while on the way over to the hospital.

## 2017-05-21 DIAGNOSIS — O26892 Other specified pregnancy related conditions, second trimester: Secondary | ICD-10-CM | POA: Diagnosis not present

## 2017-05-21 NOTE — Discharge Instructions (Signed)
Second Trimester of Pregnancy The second trimester is from week 13 through week 28, month 4 through 6. This is often the time in pregnancy that you feel your best. Often times, morning sickness has lessened or quit. You may have more energy, and you may get hungry more often. Your unborn baby (fetus) is growing rapidly. At the end of the sixth month, he or she is about 9 inches long and weighs about 1 pounds. You will likely feel the baby move (quickening) between 18 and 20 weeks of pregnancy. Follow these instructions at home:  Avoid all smoking, herbs, and alcohol. Avoid drugs not approved by your doctor.  Do not use any tobacco products, including cigarettes, chewing tobacco, and electronic cigarettes. If you need help quitting, ask your doctor. You may get counseling or other support to help you quit.  Only take medicine as told by your doctor. Some medicines are safe and some are not during pregnancy.  Exercise only as told by your doctor. Stop exercising if you start having cramps.  Eat regular, healthy meals.  Wear a good support bra if your breasts are tender.  Do not use hot tubs, steam rooms, or saunas.  Wear your seat belt when driving.  Avoid raw meat, uncooked cheese, and liter boxes and soil used by cats.  Take your prenatal vitamins.  Take 1500-2000 milligrams of calcium daily starting at the 20th week of pregnancy until you deliver your baby.  Try taking medicine that helps you poop (stool softener) as needed, and if your doctor approves. Eat more fiber by eating fresh fruit, vegetables, and whole grains. Drink enough fluids to keep your pee (urine) clear or pale yellow.  Take warm water baths (sitz baths) to soothe pain or discomfort caused by hemorrhoids. Use hemorrhoid cream if your doctor approves.  If you have puffy, bulging veins (varicose veins), wear support hose. Raise (elevate) your feet for 15 minutes, 3-4 times a day. Limit salt in your diet.  Avoid heavy  lifting, wear low heals, and sit up straight.  Rest with your legs raised if you have leg cramps or low back pain.  Visit your dentist if you have not gone during your pregnancy. Use a soft toothbrush to brush your teeth. Be gentle when you floss.  You can have sex (intercourse) unless your doctor tells you not to.  Go to your doctor visits. Get help if:  You feel dizzy.  You have mild cramps or pressure in your lower belly (abdomen).  You have a nagging pain in your belly area.  You continue to feel sick to your stomach (nauseous), throw up (vomit), or have watery poop (diarrhea).  You have bad smelling fluid coming from your vagina.  You have pain with peeing (urination). Get help right away if:  You have a fever.  You are leaking fluid from your vagina.  You have spotting or bleeding from your vagina.  You have severe belly cramping or pain.  You lose or gain weight rapidly.  You have trouble catching your breath and have chest pain.  You notice sudden or extreme puffiness (swelling) of your face, hands, ankles, feet, or legs.  You have not felt the baby move in over an hour.  You have severe headaches that do not go away with medicine.  You have vision changes. This information is not intended to replace advice given to you by your health care provider. Make sure you discuss any questions you have with your health care   provider. Document Released: 05/02/2009 Document Revised: 07/14/2015 Document Reviewed: 04/08/2012 Elsevier Interactive Patient Education  2017 Elsevier Inc.  

## 2017-05-26 ENCOUNTER — Inpatient Hospital Stay (HOSPITAL_COMMUNITY)
Admission: AD | Admit: 2017-05-26 | Discharge: 2017-05-27 | Disposition: A | Discharge status: Home / Self Care | Payer: Medicaid Other | Source: Ambulatory Visit | Attending: Obstetrics & Gynecology | Admitting: Obstetrics & Gynecology

## 2017-05-26 ENCOUNTER — Encounter (HOSPITAL_COMMUNITY): Payer: Self-pay

## 2017-05-26 ENCOUNTER — Other Ambulatory Visit: Payer: Self-pay

## 2017-05-26 DIAGNOSIS — Z3A26 26 weeks gestation of pregnancy: Secondary | ICD-10-CM | POA: Insufficient documentation

## 2017-05-26 DIAGNOSIS — A084 Viral intestinal infection, unspecified: Secondary | ICD-10-CM | POA: Diagnosis not present

## 2017-05-26 DIAGNOSIS — O99612 Diseases of the digestive system complicating pregnancy, second trimester: Secondary | ICD-10-CM | POA: Insufficient documentation

## 2017-05-26 DIAGNOSIS — O9989 Other specified diseases and conditions complicating pregnancy, childbirth and the puerperium: Secondary | ICD-10-CM | POA: Diagnosis not present

## 2017-05-26 DIAGNOSIS — O212 Late vomiting of pregnancy: Secondary | ICD-10-CM | POA: Diagnosis present

## 2017-05-26 LAB — URINALYSIS, ROUTINE W REFLEX MICROSCOPIC
BILIRUBIN URINE: NEGATIVE
Glucose, UA: NEGATIVE mg/dL
Hgb urine dipstick: NEGATIVE
KETONES UR: 80 mg/dL — AB
Leukocytes, UA: NEGATIVE
Nitrite: NEGATIVE
PH: 5 (ref 5.0–8.0)
PROTEIN: 30 mg/dL — AB
Specific Gravity, Urine: 1.027 (ref 1.005–1.030)

## 2017-05-26 LAB — BASIC METABOLIC PANEL
ANION GAP: 11 (ref 5–15)
BUN: 7 mg/dL (ref 6–20)
CALCIUM: 8.4 mg/dL — AB (ref 8.9–10.3)
CO2: 18 mmol/L — AB (ref 22–32)
Chloride: 106 mmol/L (ref 101–111)
Creatinine, Ser: 0.4 mg/dL — ABNORMAL LOW (ref 0.44–1.00)
GLUCOSE: 83 mg/dL (ref 65–99)
Potassium: 3.6 mmol/L (ref 3.5–5.1)
Sodium: 135 mmol/L (ref 135–145)

## 2017-05-26 MED ORDER — M.V.I. ADULT IV INJ
Freq: Once | INTRAVENOUS | Status: AC
  Administered 2017-05-26: 23:00:00 via INTRAVENOUS
  Filled 2017-05-26: qty 10

## 2017-05-26 MED ORDER — ONDANSETRON HCL 4 MG/2ML IJ SOLN
4.0000 mg | Freq: Once | INTRAMUSCULAR | Status: AC
  Administered 2017-05-26: 4 mg via INTRAVENOUS
  Filled 2017-05-26: qty 2

## 2017-05-26 MED ORDER — LACTATED RINGERS IV BOLUS
1000.0000 mL | Freq: Once | INTRAVENOUS | Status: AC
  Administered 2017-05-26: 1000 mL via INTRAVENOUS

## 2017-05-26 NOTE — Progress Notes (Addendum)
G2P1 @ [redacted] wksga. Here for N/V/D. States this started at 0800. Denies LOF or bleeding. +FM. EFM applied  BP retaken dt noted initial high bp.   Provider at bs assessing. Ordered LR bolus followed with multivit bolus.  2215: IV and labs done  2224: relinquished care over to [employer].

## 2017-05-26 NOTE — MAU Note (Signed)
Pt reports n/v/d all day unable to state number of times. Feels week and achy.

## 2017-05-26 NOTE — MAU Provider Note (Signed)
History     CSN: [id number]  Arrival date and time: 05/26/17 2036   First Provider Initiated Contact with Patient 05/26/17 2141      Chief Complaint  Patient presents with  . Nausea  . Emesis  . Diarrhea   HPI  Margaret Ingram is a 22 y.o. G2P1001 at [redacted]w[redacted]d who presents with n/v/d. Symptoms began this morning. No sick contacts. Reports countless episodes of vomiting and watery stools. Denies fever/chills, abdominal pain, vaginal bleeding, LOF. Positive fetal movement.  Hx of CHTN, no meds. Goes to ob/gyn in [facility] but is trying to switch to [facility].   OB History    Gravida  2   Para  1   Term  1   Preterm      AB      Living  1     SAB      TAB      Ectopic      Multiple      Live Births  1           Past Medical History:  Diagnosis Date  . Anxiety   . Diabetes mellitus without complication ([place])    pre diabetic  . Heart murmur   . Hypertension     No past surgical history on file.  Family History  Problem Relation Age of Onset  . Diabetes Other     Social History   Tobacco Use  . Smoking status: Never Smoker  . Smokeless tobacco: Never Used  Substance Use Topics  . Alcohol use: No  . Drug use: No    Comment: "not since I got pregnant"    Allergies:  Allergies  Allergen Reactions  . Azithromycin Nausea And Vomiting    Medications Prior to Admission  Medication Sig Dispense Refill Last Dose  . acetaminophen (TYLENOL) 325 MG tablet Take 650 mg by mouth every 6 (six) hours as needed for moderate pain or headache.   Past Week at Unknown time  . aspirin 81 MG chewable tablet Chew 81 mg by mouth daily.   More than a month at Unknown time  . ondansetron (ZOFRAN) 4 MG tablet Take 4 mg by mouth every 8 (eight) hours as needed for nausea.    Past Month at Unknown time  . Prenatal Vit-Fe Fumarate-FA (PRENATAL COMPLETE) 14-0.4 MG TABS Take 2 tablets by mouth daily. 60 each 0 More than a month at Unknown time    Review of Systems   Constitutional: Negative.   Gastrointestinal: Positive for diarrhea, nausea and vomiting. Negative for abdominal pain, anal bleeding and blood in stool.  Genitourinary: Negative.    Physical Exam   Blood pressure 133/68, pulse (!) 112, temperature 98.8 F (37.1 C), resp. rate 16, height 5\' 2"  (1.575 m), weight 225 lb (102.1 kg), last menstrual period 10/22/2016, SpO2 97 %, unknown if currently breastfeeding.  Physical Exam  Nursing note and vitals reviewed. Constitutional: She is oriented to person, place, and time. She appears well-developed and well-nourished. No distress.  HENT:  Head: Normocephalic and atraumatic.  Eyes: Conjunctivae are normal. Right eye exhibits no discharge. Left eye exhibits no discharge. No scleral icterus.  Neck: Normal range of motion.  Cardiovascular: Regular rhythm and normal heart sounds. Tachycardia present.  No murmur heard. Respiratory: Effort normal and breath sounds normal. No respiratory distress. She has no wheezes.  GI: Soft. Bowel sounds are normal. There is no tenderness.  Neurological: She is alert and oriented to person, place, and time.  Skin:  Skin is warm and dry. She is not diaphoretic.  Psychiatric: She has a normal mood and affect. Her behavior is normal. Judgment and thought content normal.    MAU Course  Procedures Results for orders placed or performed during the hospital encounter of 05/26/17 (from the past 24 hour(s))  Urinalysis, Routine w reflex microscopic     Status: Abnormal   Collection Time: 05/26/17  8:50 PM  Result Value Ref Range   Color, Urine YELLOW YELLOW   APPearance HAZY (A) CLEAR   Specific Gravity, Urine 1.027 1.005 - 1.030   pH 5.0 5.0 - 8.0   Glucose, UA NEGATIVE NEGATIVE mg/dL   Hgb urine dipstick NEGATIVE NEGATIVE   Bilirubin Urine NEGATIVE NEGATIVE   Ketones, ur 80 (A) NEGATIVE mg/dL   Protein, ur 30 (A) NEGATIVE mg/dL   Nitrite NEGATIVE NEGATIVE   Leukocytes, UA NEGATIVE NEGATIVE   RBC / HPF 0-5 0  - 5 RBC/hpf   WBC, UA 0-5 0 - 5 WBC/hpf   Bacteria, UA RARE (A) NONE SEEN   Squamous Epithelial / LPF 6-30 (A) NONE SEEN   Mucus PRESENT   Basic metabolic panel     Status: Abnormal   Collection Time: 05/26/17 10:10 PM  Result Value Ref Range   Sodium 135 135 - 145 mmol/L   Potassium 3.6 3.5 - 5.1 mmol/L   Chloride 106 101 - 111 mmol/L   CO2 18 (L) 22 - 32 mmol/L   Glucose, Bld 83 65 - 99 mg/dL   BUN 7 6 - 20 mg/dL   Creatinine, Ser 0.40 (L) 0.44 - 1.00 mg/dL   Calcium 8.4 (L) 8.9 - 10.3 mg/dL   GFR calc non Af Amer >60 >60 mL/min   GFR calc Af Amer >60 >60 mL/min   Anion gap 11 5 - 15    MDM NST:  Baseline: 135 bpm, Variability: Good {> 6 bpm), Accelerations: Reactive and Decelerations: Absent  Intake BP elevated, not severe range. Pt with CHTN & asymptomatic. Repeat WNL.   U/a with 80+ ketones & pt tachycardic likely r/t dehydration. IV fluids ordered, LR follower by MVI in D5LR. Zofran 4 mg IV. BMP pending  Pt reports improvement in symptoms after IVF & meds. Passed PO challenge and requesting to go home.  Assessment and Plan  A: 1. Viral gastroenteritis   2. [redacted] weeks gestation of pregnancy    P: Discharge home Rx phenergan BRAT diet Discussed reasons to return to [facility] 05/26/2017, 9:41 PM

## 2017-05-27 DIAGNOSIS — O9989 Other specified diseases and conditions complicating pregnancy, childbirth and the puerperium: Secondary | ICD-10-CM | POA: Diagnosis not present

## 2017-05-27 DIAGNOSIS — A084 Viral intestinal infection, unspecified: Secondary | ICD-10-CM

## 2017-05-27 MED ORDER — PROMETHAZINE HCL 25 MG PO TABS: 25.0000 mg | ORAL_TABLET | Freq: Four times a day (QID) | ORAL | 0 refills | Status: DC | PRN

## 2017-05-27 NOTE — Discharge Instructions (Signed)
Food Choices to Help Relieve Diarrhea, Adult When you have diarrhea, the foods you eat and your eating habits are very important. Choosing the right foods and drinks can help:  Relieve diarrhea.  Replace lost fluids and nutrients.  Prevent dehydration.  What general guidelines should I follow? Relieving diarrhea  Choose foods with less than 2 g or .07 oz. of fiber per serving.  Limit fats to less than 8 tsp (38 g or 1.34 oz.) a day.  Avoid the following: ? Foods and beverages sweetened with high-fructose corn syrup, honey, or sugar alcohols such as xylitol, sorbitol, and mannitol. ? Foods that contain a lot of fat or sugar. ? Fried, greasy, or spicy foods. ? High-fiber grains, breads, and cereals. ? Raw fruits and vegetables.  Eat foods that are rich in probiotics. These foods include dairy products such as yogurt and fermented milk products. They help increase healthy bacteria in the stomach and intestines (gastrointestinal tract, or GI tract).  If you have lactose intolerance, avoid dairy products. These may make your diarrhea worse.  Take medicine to help stop diarrhea (antidiarrheal medicine) only as told by your health care provider. Replacing nutrients  Eat small meals or snacks every 3-4 hours.  Eat bland foods, such as white rice, toast, or baked potato, until your diarrhea starts to get better. Gradually reintroduce nutrient-rich foods as tolerated or as told by your health care provider. This includes: ? Well-cooked protein foods. ? Peeled, seeded, and soft-cooked fruits and vegetables. ? Low-fat dairy products.  Take vitamin and mineral supplements as told by your health care provider. Preventing dehydration   Start by sipping water or a special solution to prevent dehydration (oral rehydration solution, ORS). Urine that is clear or pale yellow means that you are getting enough fluid.  Try to drink at least 8-10 cups of fluid each day to help replace lost  fluids.  You may add other liquids in addition to water, such as clear juice or decaffeinated sports drinks, as tolerated or as told by your health care provider.  Avoid drinks with caffeine, such as coffee, tea, or soft drinks.  Avoid alcohol. What foods are recommended? The items listed may not be a complete list. Talk with your health care provider about what dietary choices are best for you. Grains White rice. White, [place], or pita breads (fresh or toasted), including plain rolls, buns, or bagels. White pasta. Saltine, soda, or graham crackers. Pretzels. Low-fiber cereal. Cooked cereals made with water (such as cornmeal, farina, or cream cereals). Plain muffins. Matzo. Melba toast. Zwieback. Vegetables Potatoes (without the skin). Most well-cooked and canned vegetables without skins or seeds. Tender lettuce. Fruits Apple sauce. Fruits canned in juice. Cooked apricots, cherries, grapefruit, peaches, pears, or plums. Fresh bananas and cantaloupe. Meats and other protein foods Baked or boiled chicken. Eggs. Tofu. Fish. Seafood. Smooth nut butters. Ground or well-cooked tender beef, ham, veal, lamb, pork, or poultry. Dairy Plain yogurt, kefir, and unsweetened liquid yogurt. Lactose-free milk, buttermilk, skim milk, or soy milk. Low-fat or nonfat hard cheese. Beverages Water. Low-calorie sports drinks. Fruit juices without pulp. Strained tomato and vegetable juices. Decaffeinated teas. Sugar-free beverages not sweetened with sugar alcohols. Oral rehydration solutions, if approved by your health care provider. Seasoning and other foods Bouillon, broth, or soups made from recommended foods. What foods are not recommended? The items listed may not be a complete list. Talk with your health care provider about what dietary choices are best for you. Grains Whole grain, whole  wheat, bran, or rye breads, rolls, pastas, and crackers. Wild or brown rice. Whole grain or bran cereals. Barley. Oats and  oatmeal. Corn tortillas or taco shells. Granola. Popcorn. Vegetables Raw vegetables. Fried vegetables. Cabbage, broccoli, Brussels sprouts, artichokes, baked beans, beet greens, corn, kale, legumes, peas, sweet potatoes, and yams. Potato skins. Cooked spinach and cabbage. Fruits Dried fruit, including raisins and dates. Raw fruits. Stewed or dried prunes. Canned fruits with syrup. Meat and other protein foods Fried or fatty meats. Deli meats. Chunky nut butters. Nuts and seeds. Beans and lentils. Berniece Salines. Hot dogs. Sausage. Dairy High-fat cheeses. Whole milk, chocolate milk, and beverages made with milk, such as milk shakes. Half-and-half. Cream. sour cream. Ice cream. Beverages Caffeinated beverages (such as coffee, tea, soda, or energy drinks). Alcoholic beverages. Fruit juices with pulp. Prune juice. Soft drinks sweetened with high-fructose corn syrup or sugar alcohols. High-calorie sports drinks. Fats and oils Butter. Cream sauces. Margarine. Salad oils. Plain salad dressings. Olives. Avocados. Mayonnaise. Sweets and desserts Sweet rolls, doughnuts, and sweet breads. Sugar-free desserts sweetened with sugar alcohols such as xylitol and sorbitol. Seasoning and other foods Honey. Hot sauce. Chili powder. Gravy. Cream-based or milk-based soups. Pancakes and waffles. Summary  When you have diarrhea, the foods you eat and your eating habits are very important.  Make sure you get at least 8-10 cups of fluid each day, or enough to keep your urine clear or pale yellow.  Eat bland foods and gradually reintroduce healthy, nutrient-rich foods as tolerated, or as told by your health care provider.  Avoid high-fiber, fried, greasy, or spicy foods. This information is not intended to replace advice given to you by your health care provider. Make sure you discuss any questions you have with your health care provider. Document Released: 04/28/2003 Document Revised: 02/03/2016 Document Reviewed:  02/03/2016 Elsevier Interactive Patient Education  2018 [employer]. Viral Gastroenteritis, Adult Viral gastroenteritis is also known as the stomach flu. This condition is caused by certain germs (viruses). These germs can be passed from person to person very easily (are very contagious). This condition can cause sudden watery poop (diarrhea), fever, and throwing up (vomiting). Having watery poop and throwing up can make you feel weak and cause you to get dehydrated. Dehydration can make you tired and thirsty, make you have a dry mouth, and make it so you pee (urinate) less often. Older adults and people with other diseases or a weak defense system (immune system) are at higher risk for dehydration. It is important to replace the fluids that you lose from having watery poop and throwing up. Follow these instructions at home: Follow instructions from your doctor about how to care for yourself at home. Eating and drinking  Follow these instructions as told by your doctor:  Take an oral rehydration solution (ORS). This is a drink that is sold at pharmacies and stores.  Drink clear fluids in small amounts as you are able, such as: ? Water. ? Ice chips. ? Diluted fruit juice. ? Low-calorie sports drinks.  Eat bland, easy-to-digest foods in small amounts as you are able, such as: ? Bananas. ? Applesauce. ? Rice. ? Low-fat (lean) meats. ? Toast. ? Crackers.  Avoid fluids that have a lot of sugar or caffeine in them.  Avoid alcohol.  Avoid spicy or fatty foods.  General instructions  Drink enough fluid to keep your pee (urine) clear or pale yellow.  Wash your hands often. If you cannot use soap and water, use hand sanitizer.  Make  sure that all people in your home wash their hands well and often.  Rest at home while you get better.  Take over-the-counter and prescription medicines only as told by your doctor.  Watch your condition for any changes.  Take a warm bath to help  with any burning or pain from having watery poop.  Keep all follow-up visits as told by your doctor. This is important. Contact a doctor if:  You cannot keep fluids down.  Your symptoms get worse.  You have new symptoms.  You feel light-headed or dizzy.  You have muscle cramps. Get help right away if:  You have chest pain.  You feel very weak or you pass out (faint).  You see blood in your throw-up.  Your throw-up looks like coffee grounds.  You have bloody or black poop (stools) or poop that look like tar.  You have a very bad headache, a stiff neck, or both.  You have a rash.  You have very bad pain, cramping, or bloating in your belly (abdomen).  You have trouble breathing.  You are breathing very quickly.  Your heart is beating very quickly.  Your skin feels cold and clammy.  You feel confused.  You have pain when you pee.  You have signs of dehydration, such as: ? Dark pee, hardly any pee, or no pee. ? Cracked lips. ? Dry mouth. ? Sunken eyes. ? Sleepiness. ? Weakness. This information is not intended to replace advice given to you by your health care provider. Make sure you discuss any questions you have with your health care provider. Document Released: 07/25/2007 Document Revised: 08/26/2015 Document Reviewed: 10/12/2014 Elsevier Interactive Patient Education  2017 [employer].

## 2017-06-12 ENCOUNTER — Other Ambulatory Visit: Payer: Self-pay

## 2017-06-12 ENCOUNTER — Encounter (HOSPITAL_COMMUNITY): Payer: Self-pay

## 2017-06-12 ENCOUNTER — Inpatient Hospital Stay (HOSPITAL_COMMUNITY)
Admission: AD | Admit: 2017-06-12 | Discharge: 2017-06-12 | Disposition: A | Discharge status: Home / Self Care | Payer: Medicaid Other | Source: Ambulatory Visit | Attending: Obstetrics & Gynecology | Admitting: Obstetrics & Gynecology

## 2017-06-12 DIAGNOSIS — O479 False labor, unspecified: Secondary | ICD-10-CM | POA: Diagnosis not present

## 2017-06-12 DIAGNOSIS — O4700 False labor before 37 completed weeks of gestation, unspecified trimester: Secondary | ICD-10-CM | POA: Diagnosis present

## 2017-06-12 DIAGNOSIS — O4703 False labor before 37 completed weeks of gestation, third trimester: Secondary | ICD-10-CM | POA: Diagnosis present

## 2017-06-12 DIAGNOSIS — Z3A28 28 weeks gestation of pregnancy: Secondary | ICD-10-CM | POA: Insufficient documentation

## 2017-06-12 HISTORY — DX: False labor before 37 completed weeks of gestation, unspecified trimester: O47.00

## 2017-06-12 LAB — URINALYSIS, ROUTINE W REFLEX MICROSCOPIC
Bilirubin Urine: NEGATIVE
GLUCOSE, UA: NEGATIVE mg/dL
HGB URINE DIPSTICK: NEGATIVE
Ketones, ur: NEGATIVE mg/dL
Leukocytes, UA: NEGATIVE
Nitrite: NEGATIVE
Protein, ur: NEGATIVE mg/dL
SPECIFIC GRAVITY, URINE: 1.025 (ref 1.005–1.030)
pH: 7 (ref 5.0–8.0)

## 2017-06-12 NOTE — Discharge Instructions (Signed)
Continue bedrest as previously prescribed by your [facility] provider.

## 2017-06-12 NOTE — MAU Note (Addendum)
Pt states that she started feeling ctx's this morning around 0800. She rating the pain 8/10 constant.  Denies vaginal bleeding or LOF

## 2017-06-12 NOTE — MAU Note (Signed)
Urine in lab 

## 2017-06-12 NOTE — MAU Provider Note (Signed)
History     CSN: [id number]  Arrival date and time: 06/12/17 1735   First Provider Initiated Contact with Patient 06/12/17 1815      Chief Complaint  Patient presents with  . Contractions   HPI  [name] is a 22 y.o. year old [id number] female at [redacted]w[redacted]d weeks gestation who presents to MAU reporting UC's since this morning around 0800. She was seen by her OB provider [facility] provider in [place], [place]) yesterday and was told she was 1 cm "all the way through". She also has already received BMZ injections x 2. She reports that her provider told her to be seen since she had been contracting all day. She endorses good (+) FM today. She rates the pain 8/10.  Past Medical History:  Diagnosis Date  . Anxiety   . Diabetes mellitus without complication ([place])    pre diabetic  . Heart murmur   . Hypertension     History reviewed. No pertinent surgical history.  Family History  Problem Relation Age of Onset  . Diabetes Other     Social History   Tobacco Use  . Smoking status: Never Smoker  . Smokeless tobacco: Never Used  Substance Use Topics  . Alcohol use: No  . Drug use: No    Comment: "not since I got pregnant"    Allergies:  Allergies  Allergen Reactions  . Azithromycin Nausea And Vomiting    Medications Prior to Admission  Medication Sig Dispense Refill Last Dose  . acetaminophen (TYLENOL) 325 MG tablet Take 650 mg by mouth every 6 (six) hours as needed for moderate pain or headache.   Past Week at Unknown time  . aspirin 81 MG chewable tablet Chew 81 mg by mouth daily.   More than a month at Unknown time  . ondansetron (ZOFRAN) 4 MG tablet Take 4 mg by mouth every 8 (eight) hours as needed for nausea.    Past Month at Unknown time  . Prenatal Vit-Fe Fumarate-FA (PRENATAL COMPLETE) 14-0.4 MG TABS Take 2 tablets by mouth daily. 60 each 0 More than a month at Unknown time  . promethazine (PHENERGAN) 25 MG tablet Take 1 tablet (25 mg total) by mouth  every 6 (six) hours as needed for nausea or vomiting. 30 tablet 0     Review of Systems  Constitutional: Negative.   HENT: Negative.   Eyes: Negative.   Respiratory: Negative.   Cardiovascular: Negative.   Gastrointestinal: Positive for abdominal pain (UC's).  Endocrine: Negative.   Genitourinary: Positive for pelvic pain ("pressure").  Musculoskeletal: Negative.   Skin: Negative.   Allergic/Immunologic: Negative.   Neurological: Negative.   Hematological: Negative.   Psychiatric/Behavioral: Negative.    Physical Exam   Blood pressure (!) 149/95, pulse 85, temperature 98.6 F (37 C), temperature source Oral, resp. rate 20, last menstrual period 10/22/2016, SpO2 99 %.  Physical Exam  Nursing note and vitals reviewed. Constitutional: She is oriented to person, place, and time. She appears well-developed and well-nourished.  HENT:  Head: Normocephalic and atraumatic.  Eyes: Pupils are equal, round, and reactive to light.  Neck: Normal range of motion.  Cardiovascular: Normal rate, regular rhythm, normal heart sounds and intact distal pulses.  Respiratory: Effort normal and breath sounds normal.  GI: Soft. Bowel sounds are normal.  Genitourinary:  Genitourinary Comments: Dilation: Closed(ext os 1 cm, int os closed) Effacement (%): Thick Cervical Position: Posterior Presentation: Undeterminable Exam by: Margaret Ingram, CNM   Musculoskeletal: Normal range of  motion.  Neurological: She is alert and oriented to person, place, and time.  Skin: Skin is warm and dry.  Psychiatric: She has a normal mood and affect. Her behavior is normal. Judgment and thought content normal.    MAU Course  Procedures  MDM CCUA NST - FHR: 140 bpm / moderate variability / accels present / decels absent / TOCO: none  Results for orders placed or performed during the hospital encounter of 06/12/17 (from the past 24 hour(s))  Urinalysis, Routine w reflex microscopic     Status: Abnormal   Collection  Time: 06/12/17  5:44 PM  Result Value Ref Range   Color, Urine YELLOW YELLOW   APPearance CLOUDY (A) CLEAR   Specific Gravity, Urine 1.025 1.005 - 1.030   pH 7.0 5.0 - 8.0   Glucose, UA NEGATIVE NEGATIVE mg/dL   Hgb urine dipstick NEGATIVE NEGATIVE   Bilirubin Urine NEGATIVE NEGATIVE   Ketones, ur NEGATIVE NEGATIVE mg/dL   Protein, ur NEGATIVE NEGATIVE mg/dL   Nitrite NEGATIVE NEGATIVE   Leukocytes, UA NEGATIVE NEGATIVE   Assessment and Plan  Preterm contractions - Plan: Discharge patient - Information provided on preventing preterm contractions - Advised to continue bedrest as previously prescribed  - Advised to call OB office and plan to seek care at [facility]  - Return to MAU for emergencies - Discharge home - Patient verbalized an understanding of the plan of care and agrees.   Laury Deep, MSN, CNM 06/12/2017, 6:15 PM

## 2017-07-20 IMAGING — US US OB TRANSVAGINAL
1 series · 13 of 28 positions shown · non-contrast
Comparison: None.

CLINICAL DATA: Chest pain and pelvic pain with some vaginal
spotting. Beta HCG 141,263 (5 days ago 152,193). Estimated
gestational age per LMP 11 weeks 5 days.

EXAM:
OBSTETRIC <14 WK US AND TRANSVAGINAL OB US
TECHNIQUE: Both transabdominal and transvaginal ultrasound examinations were
performed for complete evaluation of the gestation as well as the
maternal uterus, adnexal regions, and pelvic cul-de-sac.
Transvaginal technique was performed to assess early pregnancy.

[Series 1: us ob transvaginal · 0.23mm/px · 46 acquisitions, 13 frames shown]
[im 2/46]
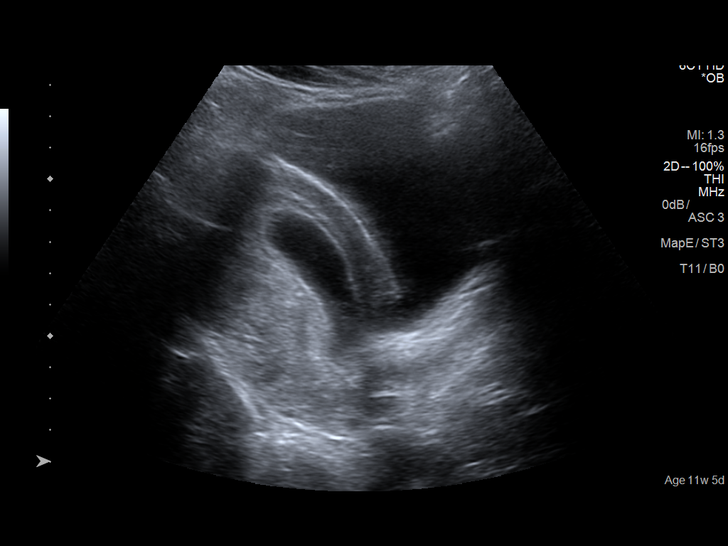
[im 6/46]
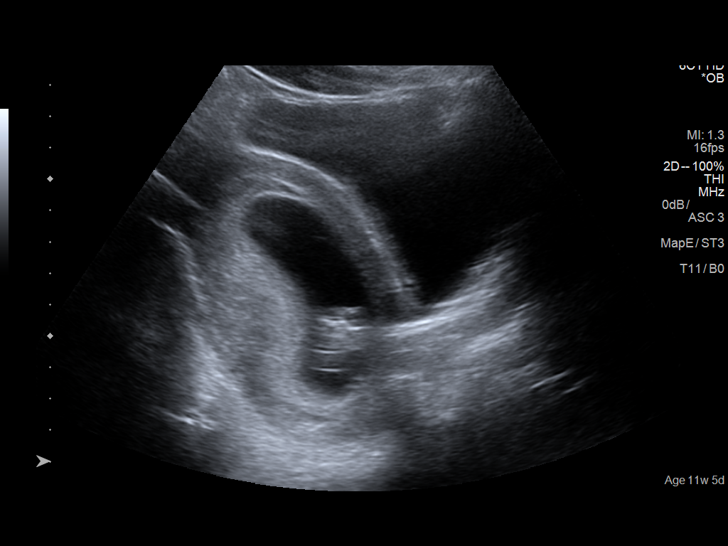
[im 9/46]
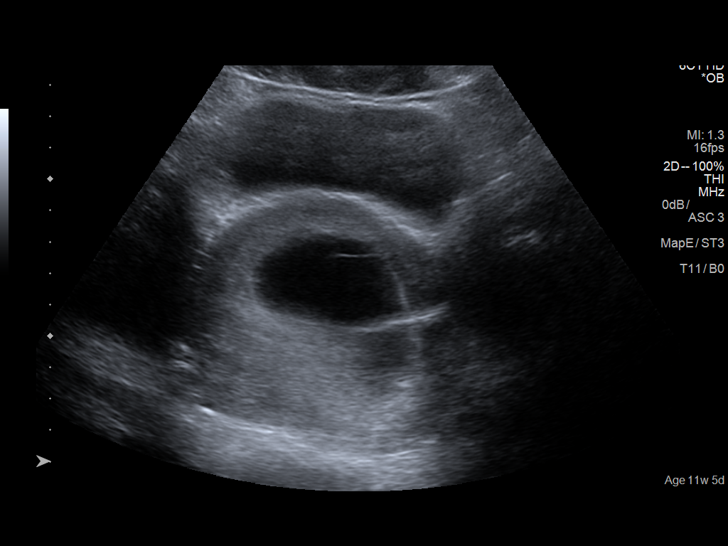
[im 12/46]
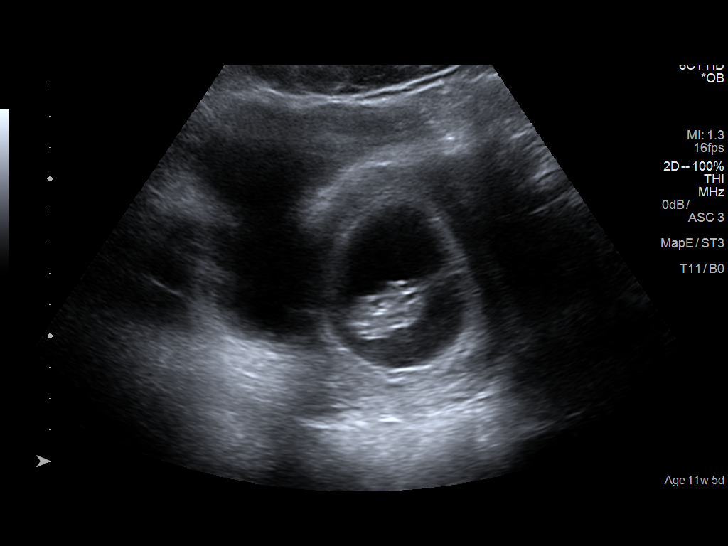
[im 16/46]
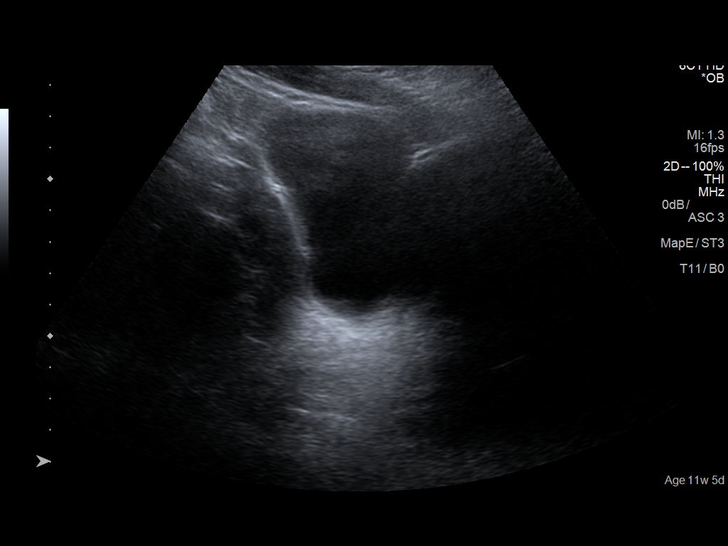
[im 19/46]
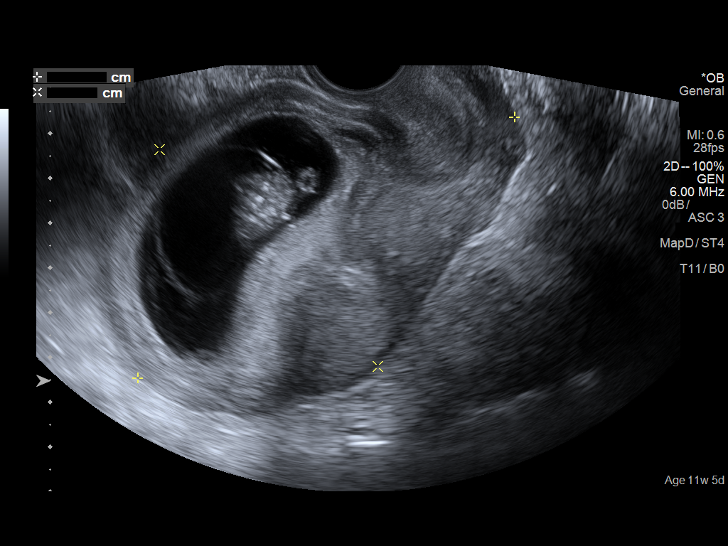
[im 24/46]
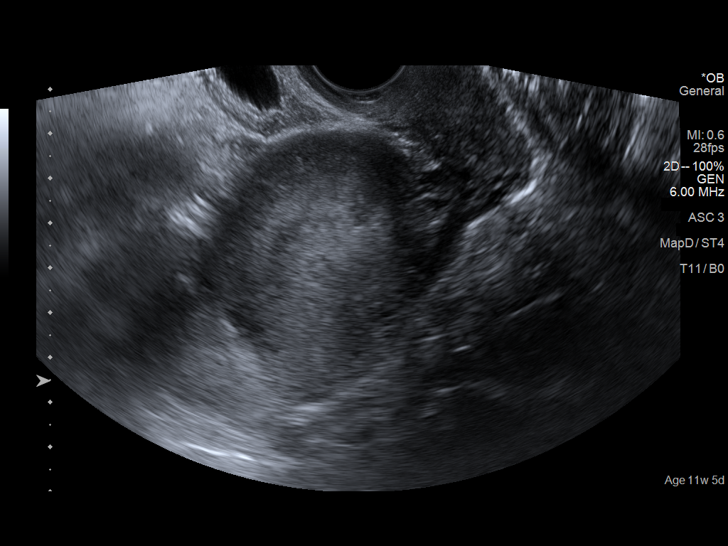
[im 27/46]
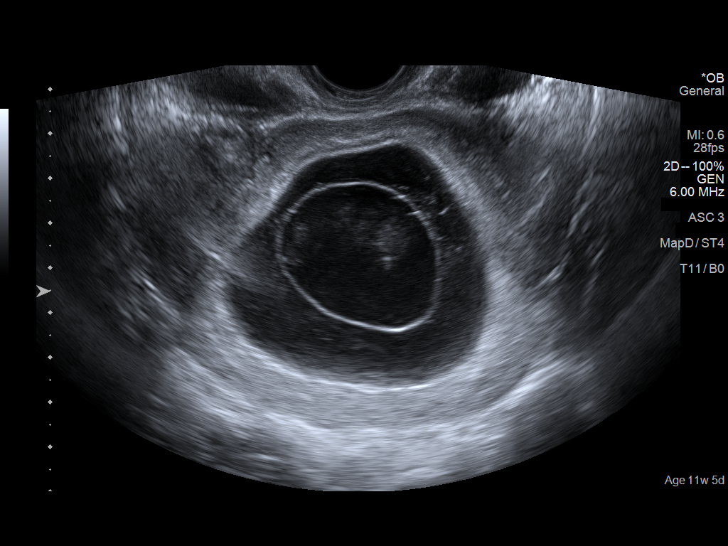
[im 31/46]
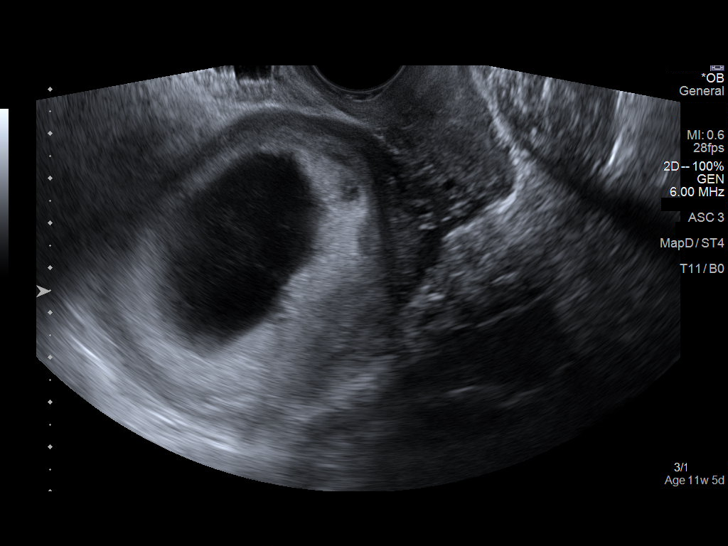
[im 34/46]
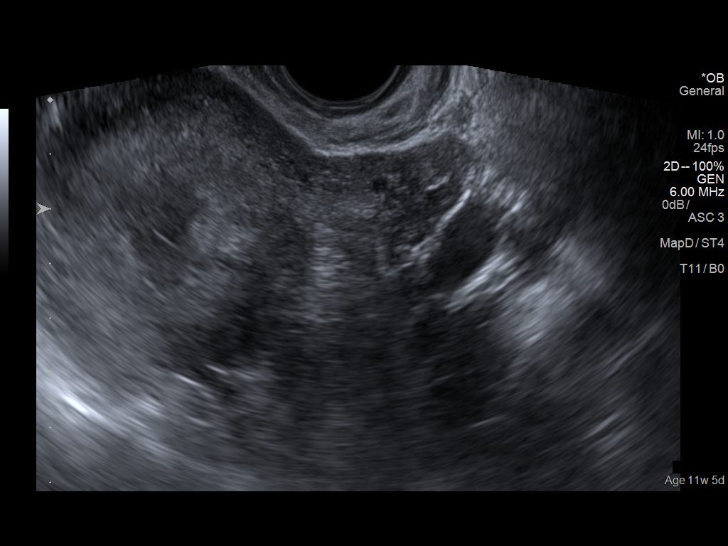
[im 37/46]
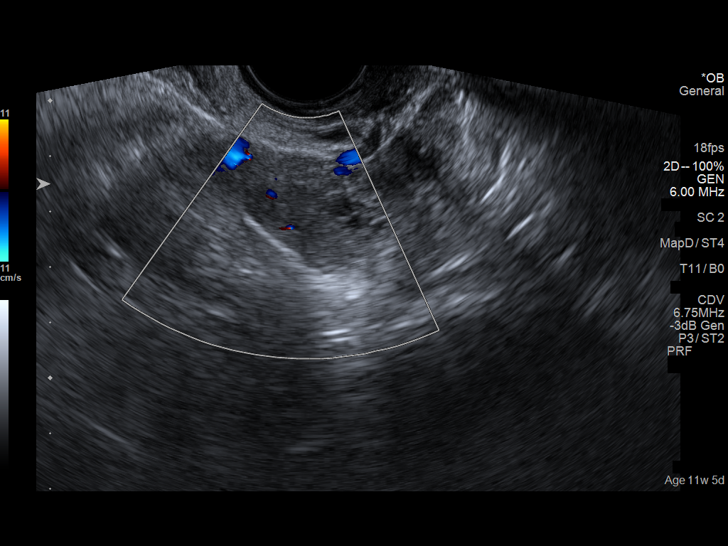
[im 41/46]
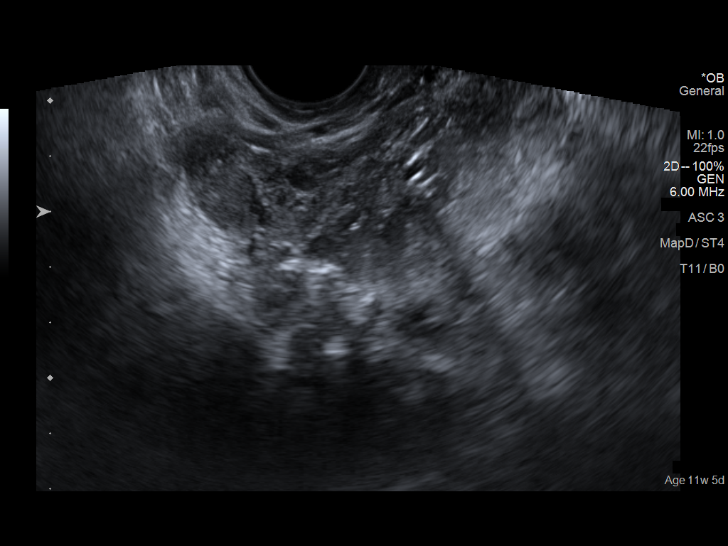
[im 44/46]
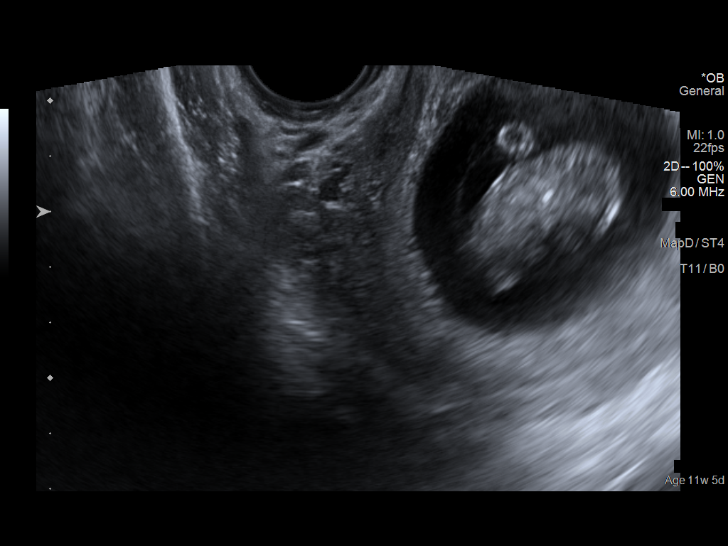

[13 of 28 positions shown; findings below may reference images not displayed]

FINDINGS: Intrauterine gestational sac: Single visualized. Mild internal
debris within the amniotic fluid.

Yolk sac:  Visualized.

Embryo:  Visualized.

Cardiac Activity: Visualized.

Heart Rate: 149  bpm

CRL:  30.5  mm   9 w   5 d                  US EDC: 02/23/2016

Subchorionic hemorrhage:  Small subchorionic hemorrhage.

Maternal uterus/adnexae: Right ovary is normal. Left ovary is not
visualized. Cervix long and closed. No significant free pelvic
fluid.
IMPRESSION: Single live IUP with estimated gestational age 9 weeks 5 days. Mild
internal echoes within the amniotic fluid. Recommend serial followup
quantitative beta HCG and ultrasound as clinically indicated.

Small subchronic hemorrhage.

## 2017-08-12 DIAGNOSIS — O1490 Unspecified pre-eclampsia, unspecified trimester: Secondary | ICD-10-CM

## 2017-10-11 ENCOUNTER — Ambulatory Visit (HOSPITAL_COMMUNITY)
Admission: EM | Admit: 2017-10-11 | Discharge: 2017-10-11 | Disposition: A | Discharge status: Home / Self Care | Payer: Self-pay | Attending: Family Medicine | Admitting: Family Medicine

## 2017-10-11 ENCOUNTER — Encounter (HOSPITAL_COMMUNITY): Payer: Self-pay

## 2017-10-11 DIAGNOSIS — E876 Hypokalemia: Secondary | ICD-10-CM

## 2017-10-11 DIAGNOSIS — R197 Diarrhea, unspecified: Secondary | ICD-10-CM

## 2017-10-11 LAB — POCT I-STAT, CHEM 8
BUN: 17 mg/dL (ref 6–20)
CALCIUM ION: 1.01 mmol/L — AB (ref 1.15–1.40)
CREATININE: 1.3 mg/dL — AB (ref 0.44–1.00)
Chloride: 99 mmol/L (ref 98–111)
Glucose, Bld: 106 mg/dL — ABNORMAL HIGH (ref 70–99)
HCT: 34 % — ABNORMAL LOW (ref 36.0–46.0)
HEMOGLOBIN: 11.6 g/dL — AB (ref 12.0–15.0)
Potassium: 3 mmol/L — ABNORMAL LOW (ref 3.5–5.1)
Sodium: 135 mmol/L (ref 135–145)
TCO2: 21 mmol/L — AB (ref 22–32)

## 2017-10-11 LAB — POCT URINALYSIS DIP (DEVICE)
Glucose, UA: NEGATIVE mg/dL
KETONES UR: 40 mg/dL — AB
Leukocytes, UA: NEGATIVE
Nitrite: NEGATIVE
PH: 6 (ref 5.0–8.0)
PROTEIN: 100 mg/dL — AB
SPECIFIC GRAVITY, URINE: 1.025 (ref 1.005–1.030)
UROBILINOGEN UA: 0.2 mg/dL (ref 0.0–1.0)

## 2017-10-11 MED ORDER — ONDANSETRON 4 MG PO TBDP
4.0000 mg | ORAL_TABLET | Freq: Once | ORAL | Status: AC
  Administered 2017-10-11: 4 mg via ORAL

## 2017-10-11 MED ORDER — ONDANSETRON 4 MG PO TBDP
ORAL_TABLET | ORAL | Status: AC
  Filled 2017-10-11: qty 1

## 2017-10-11 MED ORDER — ONDANSETRON HCL 4 MG PO TABS: 4.0000 mg | ORAL_TABLET | Freq: Three times a day (TID) | ORAL | 0 refills | Status: DC | PRN

## 2017-10-11 MED ORDER — LOPERAMIDE HCL 2 MG PO CAPS: 2.0000 mg | ORAL_CAPSULE | Freq: Four times a day (QID) | ORAL | 0 refills | Status: DC | PRN

## 2017-10-11 NOTE — ED Provider Notes (Signed)
Margaret Ingram    CSN: [id number] Arrival date & time: 10/11/17  1844     History   Chief Complaint Chief Complaint  Patient presents with  . Emesis  . Diarrhea    HPI Margaret Ingram is a 22 y.o. female.   HPI  Patient is here with her mother, her sister, and 1 of her children.  The mother states that 1 of the children at home had viral gastroenteritis last week.  Now patient has nausea vomiting diarrhea of 4 days duration.  Is one month postpartum.  No vaginal bleeding.  She is not breast-feeding.  She states that she has nausea but not very much vomiting, unless she tries to eat solid food.  She said no solid food for 2 days, but is keeping down water.  Every time she eats something it causes immediate diarrhea.  She states the stool is very soft but not watery.  No blood or mucus in the stool.  She is having crampy abdominal pain that comes and goes.  No severe abdominal pain.  No history of any colitis or stomach problems.  Past Medical History:  Diagnosis Date  . Anxiety   . Diabetes mellitus without complication ([place])    pre diabetic  . Heart murmur   . Hypertension     Patient Active Problem List   Diagnosis Date Noted  . Preterm contractions 06/12/2017    History reviewed. No pertinent surgical history.  OB History    Gravida  2   Para  1   Term  1   Preterm      AB      Living  1     SAB      TAB      Ectopic      Multiple      Live Births  1            Home Medications    Prior to Admission medications   Medication Sig Start Date End Date Taking? Authorizing Provider  acetaminophen (TYLENOL) 325 MG tablet Take 650 mg by mouth every 6 (six) hours as needed for moderate pain or headache.    [provider]  aspirin 81 MG chewable tablet Chew 81 mg by mouth daily.    [provider]  loperamide (IMODIUM) 2 MG capsule Take 1 capsule (2 mg total) by mouth 4 (four) times daily as needed for diarrhea or loose  stools. 10/11/17   Raylene Everts, MD  ondansetron (ZOFRAN) 4 MG tablet Take 1-2 tablets (4-8 mg total) by mouth every 8 (eight) hours as needed for nausea. 10/11/17   Raylene Everts, MD  Prenatal Vit-Fe Fumarate-FA (PRENATAL COMPLETE) 14-0.4 MG TABS Take 2 tablets by mouth daily. 10/18/16   Muthersbaugh, Jarrett Soho, PA-C  promethazine (PHENERGAN) 25 MG tablet Take 1 tablet (25 mg total) by mouth every 6 (six) hours as needed for nausea or vomiting. 05/27/17   Jorje Guild, NP    Family History Family History  Problem Relation Age of Onset  . Diabetes Other   . Healthy Mother   . Healthy Father     Social History Social History   Tobacco Use  . Smoking status: Never Smoker  . Smokeless tobacco: Never Used  Substance Use Topics  . Alcohol use: No  . Drug use: No    Comment: "not since I got pregnant"     Allergies   Azithromycin   Review of Systems Review of Systems  Constitutional:  Positive for chills, fatigue and fever.  HENT: Negative for ear pain and sore throat.   Eyes: Negative for pain and visual disturbance.  Respiratory: Negative for cough and shortness of breath.   Cardiovascular: Negative for chest pain and palpitations.  Gastrointestinal: Positive for diarrhea, nausea and vomiting. Negative for abdominal pain.  Genitourinary: Negative for dysuria and hematuria.  Musculoskeletal: Positive for myalgias. Negative for arthralgias and back pain.  Skin: Negative for color change and rash.  Neurological: Negative for seizures and syncope.  All other systems reviewed and are negative.    Physical Exam Triage Vital Signs ED Triage Vitals  Enc Vitals Group     BP 10/11/17 1858 119/74     Pulse Rate 10/11/17 1858 (!) 130     Resp 10/11/17 1858 18     Temp 10/11/17 1858 99.8 F (37.7 C)     Temp src --      SpO2 10/11/17 1858 100 %     Weight --      Height --      Head Circumference --      Peak Flow --      Pain Score 10/11/17 1856 10     Pain Loc --       Pain Edu? --      Excl. in [place]? --   Repeat pulse, resting, 98 No data found.  Updated Vital Signs BP 119/74   Pulse (!) 130   Temp 99.8 F (37.7 C)   Resp 18   LMP 10/11/2017   SpO2 100%   Breastfeeding? Yes       Physical Exam   UC Treatments / Results  Labs (all labs ordered are listed, but only abnormal results are displayed) Labs Reviewed  POCT URINALYSIS DIP (DEVICE) - Abnormal; Notable for the following components:      Result Value   Bilirubin Urine MODERATE (*)    Ketones, ur 40 (*)    Hgb urine dipstick LARGE (*)    Protein, ur 100 (*)    All other components within normal limits  POCT I-STAT, CHEM 8 - Abnormal; Notable for the following components:   Potassium 3.0 (*)    Creatinine, Ser 1.30 (*)    Glucose, Bld 106 (*)    Calcium, Ion 1.01 (*)    TCO2 21 (*)    Hemoglobin 11.6 (*)    HCT 34.0 (*)    All other components within normal limits    EKG None  Radiology No results found.  Procedures Procedures  Medications Ordered in UC Medications  ondansetron (ZOFRAN-ODT) disintegrating tablet 4 mg (4 mg Oral Given 10/11/17 1941)    Initial Impression / Assessment and Plan / UC Course  I have reviewed the triage vital signs and the nursing notes.  Pertinent labs & imaging results that were available during my care of the patient were reviewed by me and considered in my medical decision making (see chart for details).     Discussed the importance of oral rehydration.  Discussed the abnormal electrolytes.  Patient is offered IV rehydration and a trip to the emergency room, she wishes to try treatment at home. Final Clinical Impressions(s) / UC Diagnoses   Final diagnoses:  Diarrhea of presumed infectious origin  Hypokalemia     Discharge Instructions     Drink plenty of water and fluids Eat potassium foods Take Zofran as needed for nausea and vomiting Take Imodium as needed for diarrhea Get plenty of rest No work until  Monday Return if you get worse instead of better, or if you are unable to keep down fluids   ED Prescriptions    Medication Sig Dispense Auth. Provider   ondansetron (ZOFRAN) 4 MG tablet Take 1-2 tablets (4-8 mg total) by mouth every 8 (eight) hours as needed for nausea. 20 tablet Raylene Everts, MD   loperamide (IMODIUM) 2 MG capsule Take 1 capsule (2 mg total) by mouth 4 (four) times daily as needed for diarrhea or loose stools. 12 capsule Raylene Everts, MD     Controlled Substance Prescriptions [place] Controlled Substance Registry consulted? Not Applicable   Raylene Everts, MD 10/11/17 2046

## 2017-10-11 NOTE — ED Triage Notes (Signed)
Pt presents with complaints of body aches, emesis and diarrhea x 2 days. Pt is 1 month post partum.

## 2017-10-11 NOTE — Discharge Instructions (Signed)
Drink plenty of water and fluids Eat potassium foods Take Zofran as needed for nausea and vomiting Take Imodium as needed for diarrhea Get plenty of rest No work until Monday Return if you get worse instead of better, or if you are unable to keep down fluids

## 2017-12-06 ENCOUNTER — Encounter (HOSPITAL_COMMUNITY): Payer: Self-pay

## 2017-12-06 ENCOUNTER — Ambulatory Visit (HOSPITAL_COMMUNITY)
Admission: EM | Admit: 2017-12-06 | Discharge: 2017-12-06 | Disposition: A | Discharge status: Home / Self Care | Payer: Medicaid Other | Attending: Family Medicine | Admitting: Family Medicine

## 2017-12-06 DIAGNOSIS — I10 Essential (primary) hypertension: Secondary | ICD-10-CM | POA: Diagnosis not present

## 2017-12-06 MED ORDER — HYDROCHLOROTHIAZIDE 25 MG PO TABS: 25.0000 mg | ORAL_TABLET | Freq: Every day | ORAL | 0 refills | Status: DC

## 2017-12-06 NOTE — Discharge Instructions (Signed)
Take the HCTZ once a day Avoid salt Try to get rest and SLEEP

## 2017-12-06 NOTE — ED Provider Notes (Signed)
[place]    CSN: [id number] Arrival date & time: 12/06/17  1923     History   Chief Complaint Chief Complaint  Patient presents with  . Headache  . Dizziness  . Hypertension    HPI Margaret Ingram is a 22 y.o. female.   HPI  Patient has 2 children 1 of them who is a year and a half, the other one is 81 months old.  She is not breast-feeding.  She states she had elevated blood pressure with both of her pregnancies.  She has blood pressure in her family.  Since she is at the babies she is consistently had elevated blood pressures off and on.  At her last visit here her blood pressure was normal, however, she was dehydrated.  Today her blood pressures running in the 1 50-1 60 range over 90-100.  She states she is been having some headaches.  At times feels dizzy. She admits to feeling like she is on a "roller coaster of emotions" since the second baby.  She thinks she might have some postpartum depression.  She is made an appointment to see a new PCP next month to discuss this. She would like to be on blood pressure medication.  She thinks it would make her consistently better.  She took labetalol with both pregnancies.  She tolerated this well. She does not sleep well.  She considers herself very stressed.  With her 2 babies she just went back to work at [employer] and works 6 AM to 2.  She stressed finding childcare.  She has no time for herself.  She worries about money.  Past Medical History:  Diagnosis Date  . Anxiety   . Diabetes mellitus without complication ([place])    pre diabetic  . Heart murmur   . Hypertension     Patient Active Problem List   Diagnosis Date Noted  . Preterm contractions 06/12/2017    History reviewed. No pertinent surgical history.  OB History    Gravida  2   Para  1   Term  1   Preterm      AB      Living  1     SAB      TAB      Ectopic      Multiple      Live Births  1            Home Medications     Prior to Admission medications   Medication Sig Start Date End Date Taking? Authorizing Provider  acetaminophen (TYLENOL) 325 MG tablet Take 650 mg by mouth every 6 (six) hours as needed for moderate pain or headache.    [provider]  aspirin 81 MG chewable tablet Chew 81 mg by mouth daily.    [provider]  hydrochlorothiazide (HYDRODIURIL) 25 MG tablet Take 1 tablet (25 mg total) by mouth daily. 12/06/17   Raylene Everts, MD  Prenatal Vit-Fe Fumarate-FA (PRENATAL COMPLETE) 14-0.4 MG TABS Take 2 tablets by mouth daily. 10/18/16   Muthersbaugh, Jarrett Soho, PA-C    Family History Family History  Problem Relation Age of Onset  . Diabetes Other   . Healthy Mother   . Healthy Father     Social History Social History   Tobacco Use  . Smoking status: Never Smoker  . Smokeless tobacco: Never Used  Substance Use Topics  . Alcohol use: No  . Drug use: No    Comment: "not since I got  pregnant"     Allergies   Azithromycin   Review of Systems Review of Systems  Constitutional: Negative for chills and fever.  HENT: Negative for ear pain and sore throat.   Eyes: Negative for photophobia, pain and visual disturbance.  Respiratory: Negative for cough and shortness of breath.   Cardiovascular: Negative for chest pain and palpitations.  Gastrointestinal: Negative for abdominal pain, nausea and vomiting.  Genitourinary: Negative for dysuria and hematuria.  Musculoskeletal: Negative for arthralgias and back pain.  Skin: Negative for color change and rash.  Neurological: Positive for light-headedness and headaches. Negative for seizures and syncope.       Headaches, at times, vague lightheadedness, at times  Psychiatric/Behavioral: Positive for dysphoric mood and sleep disturbance.       Irritable, depressed, upset easily  All other systems reviewed and are negative.    Physical Exam Triage Vital Signs ED Triage Vitals  Enc Vitals Group     BP 12/06/17  2004 (!) 152/83     Pulse Rate 12/06/17 2004 76     Resp 12/06/17 2004 16     Temp 12/06/17 2004 98.3 F (36.8 C)     Temp Source 12/06/17 2004 Oral     SpO2 12/06/17 2004 100 %     Weight --      Height --      Head Circumference --      Peak Flow --      Pain Score 12/06/17 2006 7     Pain Loc --      Pain Edu? --      Excl. in [place]? --    No data found.  Updated Vital Signs BP (!) 152/83 (BP Location: Left Arm)   Pulse 76   Temp 98.3 F (36.8 C) (Oral)   Resp 16   LMP 12/02/2017   SpO2 100%   repeat blood pressure 162/100    Physical Exam  Constitutional: She appears well-developed and well-nourished. No distress.  HENT:  Head: Normocephalic and atraumatic.  Mouth/Throat: Oropharynx is clear and moist.  Eyes: Pupils are equal, round, and reactive to light. Conjunctivae and EOM are normal.  Neck: Normal range of motion.  Cardiovascular: Normal rate, regular rhythm and normal heart sounds.  Pulmonary/Chest: Effort normal and breath sounds normal. No respiratory distress.  Abdominal: Soft. Bowel sounds are normal. She exhibits no distension.  Musculoskeletal: Normal range of motion. She exhibits edema.  Trace edema bilaterally  Neurological: She is alert.  Skin: Skin is warm and dry.  Psychiatric: She has a normal mood and affect. Her behavior is normal.  Mild lability     UC Treatments / Results  Labs (all labs ordered are listed, but only abnormal results are displayed) Labs Reviewed - No data to display  EKG None  Radiology No results found.  Procedures Procedures (including critical care time)  Medications Ordered in UC Medications - No data to display  Initial Impression / Assessment and Plan / UC Course  I have reviewed the triage vital signs and the nursing notes.  Pertinent labs & imaging results that were available during my care of the patient were reviewed by me and considered in my medical decision making (see chart for details).    We  will start patient on HCTZ given her edema, and fluctuating blood pressure.  This can be followed up by her PCP next month.  I did give her DASH diet information and advised her to be on low-salt.  Try to  walk and get exercise every day.  Try to get more sleep. Final Clinical Impressions(s) / UC Diagnoses   Final diagnoses:  Essential hypertension     Discharge Instructions     Take the HCTZ once a day Avoid salt Try to get rest and SLEEP     ED Prescriptions    Medication Sig Dispense Auth. Provider   hydrochlorothiazide (HYDRODIURIL) 25 MG tablet Take 1 tablet (25 mg total) by mouth daily. 30 tablet Raylene Everts, MD     Controlled Substance Prescriptions [place] Controlled Substance Registry consulted? Not Applicable   Raylene Everts, MD 12/06/17 2106

## 2017-12-06 NOTE — ED Triage Notes (Signed)
Pt presents with elevated blood pressure, headache, and dizziness.

## 2018-01-30 ENCOUNTER — Other Ambulatory Visit: Payer: Self-pay

## 2018-01-30 ENCOUNTER — Inpatient Hospital Stay (HOSPITAL_COMMUNITY)
Admission: AD | Admit: 2018-01-30 | Discharge: 2018-01-30 | Disposition: A | Discharge status: Home / Self Care | Payer: Medicaid Other | Attending: Obstetrics and Gynecology | Admitting: Obstetrics and Gynecology

## 2018-01-30 ENCOUNTER — Inpatient Hospital Stay (HOSPITAL_COMMUNITY): Payer: Medicaid Other

## 2018-01-30 ENCOUNTER — Encounter (HOSPITAL_COMMUNITY): Payer: Self-pay

## 2018-01-30 DIAGNOSIS — O3680X0 Pregnancy with inconclusive fetal viability, not applicable or unspecified: Secondary | ICD-10-CM

## 2018-01-30 DIAGNOSIS — Z3201 Encounter for pregnancy test, result positive: Secondary | ICD-10-CM | POA: Diagnosis not present

## 2018-01-30 DIAGNOSIS — R102 Pelvic and perineal pain: Secondary | ICD-10-CM

## 2018-01-30 DIAGNOSIS — O9989 Other specified diseases and conditions complicating pregnancy, childbirth and the puerperium: Secondary | ICD-10-CM

## 2018-01-30 DIAGNOSIS — O219 Vomiting of pregnancy, unspecified: Secondary | ICD-10-CM | POA: Diagnosis not present

## 2018-01-30 DIAGNOSIS — R109 Unspecified abdominal pain: Secondary | ICD-10-CM | POA: Diagnosis not present

## 2018-01-30 DIAGNOSIS — M549 Dorsalgia, unspecified: Secondary | ICD-10-CM

## 2018-01-30 DIAGNOSIS — O26891 Other specified pregnancy related conditions, first trimester: Secondary | ICD-10-CM | POA: Diagnosis not present

## 2018-01-30 LAB — COMPREHENSIVE METABOLIC PANEL
ALBUMIN: 4 g/dL (ref 3.5–5.0)
ALT: 18 U/L (ref 0–44)
AST: 16 U/L (ref 15–41)
Alkaline Phosphatase: 69 U/L (ref 38–126)
Anion gap: 8 (ref 5–15)
BUN: 9 mg/dL (ref 6–20)
CO2: 24 mmol/L (ref 22–32)
Calcium: 8.3 mg/dL — ABNORMAL LOW (ref 8.9–10.3)
Chloride: 105 mmol/L (ref 98–111)
Creatinine, Ser: 0.73 mg/dL (ref 0.44–1.00)
GFR calc Af Amer: >60 mL/min (ref >60)
GFR calc non Af Amer: >60 mL/min (ref >60)
GLUCOSE: 87 mg/dL (ref 70–99)
Potassium: 3.1 mmol/L — ABNORMAL LOW (ref 3.5–5.1)
Sodium: 137 mmol/L (ref 135–145)
Total Bilirubin: 0.5 mg/dL (ref 0.3–1.2)
Total Protein: 7.9 g/dL (ref 6.5–8.1)

## 2018-01-30 LAB — URINALYSIS, ROUTINE W REFLEX MICROSCOPIC
BILIRUBIN URINE: NEGATIVE
Glucose, UA: NEGATIVE mg/dL
HGB URINE DIPSTICK: NEGATIVE
KETONES UR: NEGATIVE mg/dL
Leukocytes, UA: NEGATIVE
Nitrite: NEGATIVE
Protein, ur: NEGATIVE mg/dL
SPECIFIC GRAVITY, URINE: 1.017 (ref 1.005–1.030)
pH: 7 (ref 5.0–8.0)

## 2018-01-30 LAB — CBC
HCT: 34.4 % — ABNORMAL LOW (ref 36.0–46.0)
Hemoglobin: 11.4 g/dL — ABNORMAL LOW (ref 12.0–15.0)
MCH: 28.6 pg (ref 26.0–34.0)
MCHC: 33.1 g/dL (ref 30.0–36.0)
MCV: 86.2 fL (ref 80.0–100.0)
Platelets: 193 10*3/uL (ref 150–400)
RBC: 3.99 MIL/uL (ref 3.87–5.11)
RDW: 12 % (ref 11.5–15.5)
WBC: 5 10*3/uL (ref 4.0–10.5)
nRBC: 0 % (ref 0.0–0.2)

## 2018-01-30 LAB — TYPE AND SCREEN
ABO/RH(D): A POS
Antibody Screen: NEGATIVE

## 2018-01-30 LAB — POCT PREGNANCY, URINE
PREG TEST UR: NEGATIVE
Preg Test, Ur: POSITIVE — AB

## 2018-01-30 LAB — HCG, QUANTITATIVE, PREGNANCY: hCG, Beta Chain, Quant, S: 53 m[IU]/mL — ABNORMAL HIGH (ref <5)

## 2018-01-30 NOTE — MAU Provider Note (Addendum)
Patient Jonica Bickhart is a 22 y.o.  G3P2002 at unknown gestation reporting for pregnancy confirmation. She also reports occasional abdominal cramping, nausea/vomiting at time, and low back pain. She denies bleeding, abnormal discharge, pelvic pain.   She took 3 positive pregnancy tests today and came here for pregnancy confirmation and to get her abdominal pain checked out.   RN Note Pt here for pregnancy test. States she had 3 +upt. LMP: 01/01/2018. Pt denies reports lower abdominal pain and back pain when she stands up and walks. Reports nausea and vomiting. States she had spotting last month but none since then  History     CSN: [id number]  Arrival date and time: 01/30/18 0814   First Provider Initiated Contact with Patient 01/30/18 1948      Chief Complaint  Patient presents with  . Possible Pregnancy   Abdominal Pain  This is a new problem. The current episode started in the past 7 days. The problem occurs intermittently. The pain is located in the suprapubic region. The pain is at a severity of 3/10. The quality of the pain is cramping. The abdominal pain does not radiate. Associated symptoms include nausea and vomiting. Pertinent negatives include no constipation or diarrhea. Nothing aggravates the pain. The pain is relieved by nothing.  She has NV but also has a healthy appetite.    OB History    Gravida  3   Para  2   Term  2   Preterm      AB      Living  2     SAB      TAB      Ectopic      Multiple      Live Births  2           Past Medical History:  Diagnosis Date  . Anxiety   . Diabetes mellitus without complication ([place])    pre diabetic  . Heart murmur   . Hypertension    chronic    Past Surgical History:  Procedure Laterality Date  . WISDOM TOOTH EXTRACTION      Family History  Problem Relation Age of Onset  . Diabetes Other   . Healthy Mother   . Healthy Father     Social History   Tobacco Use  . Smoking status: Never  Smoker  . Smokeless tobacco: Never Used  Substance Use Topics  . Alcohol use: Yes    Comment: occasionally   . Drug use: No    Comment: "not since I got pregnant"    Allergies:  Allergies  Allergen Reactions  . Azithromycin Nausea And Vomiting    Medications Prior to Admission  Medication Sig Dispense Refill Last Dose  . acetaminophen (TYLENOL) 325 MG tablet Take 650 mg by mouth every 6 (six) hours as needed for moderate pain or headache.   Past Week at Unknown time  . aspirin 81 MG chewable tablet Chew 81 mg by mouth daily.   More than a month at Unknown time  . hydrochlorothiazide (HYDRODIURIL) 25 MG tablet Take 1 tablet (25 mg total) by mouth daily. 30 tablet 0   . Prenatal Vit-Fe Fumarate-FA (PRENATAL COMPLETE) 14-0.4 MG TABS Take 2 tablets by mouth daily. 60 each 0 More than a month at Unknown time    Review of Systems  Constitutional: Negative.   HENT: Negative.   Respiratory: Negative.   Cardiovascular: Negative.   Gastrointestinal: Positive for abdominal pain, nausea and vomiting. Negative for constipation and diarrhea.  Musculoskeletal: Negative.   Neurological: Negative.   Hematological: Negative.    Physical Exam   Blood pressure 122/63, pulse 77, temperature 98.1 F (36.7 C), temperature source Oral, resp. rate 18, height 5\' 2"  (1.575 m), weight 108 kg, last menstrual period 01/01/2018, SpO2 98 %, currently breastfeeding.  Physical Exam  Constitutional: She is oriented to person, place, and time. She appears well-developed.  HENT:  Head: Normocephalic.  Neck: Normal range of motion.  Respiratory: Effort normal.  GI: Soft.  Musculoskeletal: Normal range of motion.  Neurological: She is alert and oriented to person, place, and time.  Skin: Skin is warm and dry.    MAU Course  Procedures  MDM -CBC, CMP, ABO, Beta pending -[place] ordered to rule out ectopic pregnancy: US shows nothing in the uterus.  -she declines cultures or wet prep today -UPT here  negative x 1 and faintly positive x 1  Patient care endorsed to [facility] at 2049.    Assessment and Plan    Mervyn Skeeters [facility] 01/30/2018, 8:28 PM   Assumed care:  Results for orders placed or performed during the hospital encounter of 01/30/18 (from the past 24 hour(s))  Urinalysis, Routine w reflex microscopic     Status: None   Collection Time: 01/30/18  7:28 PM  Result Value Ref Range   Color, Urine YELLOW YELLOW   APPearance CLEAR CLEAR   Specific Gravity, Urine 1.017 1.005 - 1.030   pH 7.0 5.0 - 8.0   Glucose, UA NEGATIVE NEGATIVE mg/dL   Hgb urine dipstick NEGATIVE NEGATIVE   Bilirubin Urine NEGATIVE NEGATIVE   Ketones, ur NEGATIVE NEGATIVE mg/dL   Protein, ur NEGATIVE NEGATIVE mg/dL   Nitrite NEGATIVE NEGATIVE   Leukocytes, UA NEGATIVE NEGATIVE  Pregnancy, urine POC     Status: None   Collection Time: 01/30/18  7:38 PM  Result Value Ref Range   Preg Test, Ur NEGATIVE NEGATIVE  Pregnancy, urine POC     Status: Abnormal   Collection Time: 01/30/18  7:57 PM  Result Value Ref Range   Preg Test, Ur POSITIVE (A) NEGATIVE  CBC     Status: Abnormal   Collection Time: 01/30/18  8:02 PM  Result Value Ref Range   WBC 5.0 4.0 - 10.5 K/uL   RBC 3.99 3.87 - 5.11 MIL/uL   Hemoglobin 11.4 (L) 12.0 - 15.0 g/dL   HCT 34.4 (L) 36.0 - 46.0 %   MCV 86.2 80.0 - 100.0 fL   MCH 28.6 26.0 - 34.0 pg   MCHC 33.1 30.0 - 36.0 g/dL   RDW 12.0 11.5 - 15.5 %   Platelets 193 150 - 400 K/uL   nRBC 0.0 0.0 - 0.2 %  Comprehensive metabolic panel     Status: Abnormal   Collection Time: 01/30/18  8:02 PM  Result Value Ref Range   Sodium 137 135 - 145 mmol/L   Potassium 3.1 (L) 3.5 - 5.1 mmol/L   Chloride 105 98 - 111 mmol/L   CO2 24 22 - 32 mmol/L   Glucose, Bld 87 70 - 99 mg/dL   BUN 9 6 - 20 mg/dL   Creatinine, Ser 0.73 0.44 - 1.00 mg/dL   Calcium 8.3 (L) 8.9 - 10.3 mg/dL   Total Protein 7.9 6.5 - 8.1 g/dL   Albumin 4.0 3.5 - 5.0 g/dL   AST 16 15 - 41 U/L   ALT 18 0 - 44  U/L   Alkaline Phosphatase 69 38 - 126 U/L   Total Bilirubin  0.5 0.3 - 1.2 mg/dL   GFR calc non Af Amer >60 >60 mL/min   GFR calc Af Amer >60 >60 mL/min   Anion gap 8 5 - 15  hCG, quantitative, pregnancy     Status: Abnormal   Collection Time: 01/30/18  8:02 PM  Result Value Ref Range   hCG, Beta Chain, Quant, S 53 (H) <5 mIU/mL  Type and screen [place]     Status: None   Collection Time: 01/30/18  8:02 PM  Result Value Ref Range   ABO/RH(D) A POS    Antibody Screen NEG    Sample Expiration      02/02/2018 Performed at [facility], [address], [place], [place] [address]   US Ob Comp Less 14 Wks  Result Date: 01/30/2018 CLINICAL DATA:  Positive pregnancy test, abdominal pain, vaginal spotting. EXAM: OBSTETRIC <14 WK [place] AND TRANSVAGINAL OB US TECHNIQUE: Both transabdominal and transvaginal ultrasound examinations were performed for complete evaluation of the gestation as well as the maternal uterus, adnexal regions, and pelvic cul-de-sac. Transvaginal technique was performed to assess early pregnancy. COMPARISON:  None. FINDINGS: Intrauterine gestational sac: Not visualized. Yolk sac:  Not visualized. Embryo:  Not visualized. Cardiac Activity: Not visualized. Maternal uterus/adnexae: Right ovary appears normal. Probable corpus luteum cyst seen in left ovary. No free fluid is noted. IMPRESSION: No intrauterine gestational sac, yolk sac, fetal pole, or cardiac activity visualized. Differential considerations include intrauterine gestation too early to be sonographically visualized, spontaneous abortion, or ectopic pregnancy. Consider follow-up ultrasound in 14 days and serial quantitative beta HCG follow-up. Electronically Signed   By: Marijo Conception, M.D.   On: 01/30/2018 20:41   US Ob Transvaginal  Result Date: 01/30/2018 CLINICAL DATA:  Positive pregnancy test, abdominal pain, vaginal spotting. EXAM: OBSTETRIC <14 WK [place] AND TRANSVAGINAL OB US TECHNIQUE: Both  transabdominal and transvaginal ultrasound examinations were performed for complete evaluation of the gestation as well as the maternal uterus, adnexal regions, and pelvic cul-de-sac. Transvaginal technique was performed to assess early pregnancy. COMPARISON:  None. FINDINGS: Intrauterine gestational sac: Not visualized. Yolk sac:  Not visualized. Embryo:  Not visualized. Cardiac Activity: Not visualized. Maternal uterus/adnexae: Right ovary appears normal. Probable corpus luteum cyst seen in left ovary. No free fluid is noted. IMPRESSION: No intrauterine gestational sac, yolk sac, fetal pole, or cardiac activity visualized. Differential considerations include intrauterine gestation too early to be sonographically visualized, spontaneous abortion, or ectopic pregnancy. Consider follow-up ultrasound in 14 days and serial quantitative beta HCG follow-up. Electronically Signed   By: Marijo Conception, M.D.   On: 01/30/2018 20:41     Reviewed results With HCG level this low, we are probably looking at either a very early pregnancy or a pregnancy that is not progressing well.   Cannot tell from one HCG level.  Cannot rule out ectopic just yet  Recommend repeat HCG level Sunday morning If doubles, would do one more than repeat US in 2 weeks If drops, then counsel about miscarriage  Precautions reviewed Encouraged to return here or to other Urgent Care/ED if she develops worsening of symptoms, increase in pain, fever, or other concerning symptoms.    Seabron Spates, CNM

## 2018-01-30 NOTE — MAU Note (Signed)
Pt with 3 positive pregnancy test from home per [facility]  Repeat of pregnancy test in house with very faint posiitve line. QHCG ordered.

## 2018-01-30 NOTE — MAU Note (Addendum)
Pt here for pregnancy test. States she had 3 +upt. LMP: 01/01/2018. Pt denies reports lower abdominal pain and back pain when she stands up and walks. Reports nausea and vomiting. States she had spotting last month but none since then

## 2018-01-30 NOTE — Discharge Instructions (Signed)
Abdominal Pain During Pregnancy Abdominal pain is common in pregnancy. Most of the time, it does not cause harm. There are many causes of abdominal pain. Some causes are more serious than others and sometimes the cause is not known. Abdominal pain can be a sign that something is very wrong with the pregnancy or the pain may have nothing to do with the pregnancy. Always tell your health care provider if you have any abdominal pain. Follow these instructions at home:  Do not have sex or put anything in your vagina until your symptoms go away completely.  Watch your abdominal pain for any changes.  Get plenty of rest until your pain improves.  Drink enough fluid to keep your urine clear or pale yellow.  Take over-the-counter or prescription medicines only as told by your health care provider.  Keep all follow-up visits as told by your health care provider. This is important. Contact a health care provider if:  You have a fever.  Your pain gets worse or you have cramping.  Your pain continues after resting. Get help right away if:  You are bleeding, leaking fluid, or passing tissue from the vagina.  You have vomiting or diarrhea that does not go away.  You have painful or bloody urination.  You feel very weak or faint.  You have shortness of breath.  You develop a severe headache with abdominal pain.  You have abnormal vaginal discharge with abdominal pain. This information is not intended to replace advice given to you by your health care provider. Make sure you discuss any questions you have with your health care provider. Document Released: 02/05/2005 Document Revised: 11/17/2015 Document Reviewed: 09/04/2012 Elsevier Interactive Patient Education  [employer].

## 2018-01-31 LAB — ABO/RH: ABO/RH(D): A POS

## 2018-02-06 ENCOUNTER — Inpatient Hospital Stay (HOSPITAL_COMMUNITY)
Admission: AD | Admit: 2018-02-06 | Discharge: 2018-02-06 | Disposition: A | Discharge status: Home / Self Care | Payer: Medicaid Other | Attending: Obstetrics and Gynecology | Admitting: Obstetrics and Gynecology

## 2018-02-06 ENCOUNTER — Inpatient Hospital Stay (HOSPITAL_COMMUNITY): Payer: Medicaid Other

## 2018-02-06 ENCOUNTER — Encounter (HOSPITAL_COMMUNITY): Payer: Self-pay | Admitting: *Deleted

## 2018-02-06 DIAGNOSIS — O209 Hemorrhage in early pregnancy, unspecified: Secondary | ICD-10-CM | POA: Diagnosis not present

## 2018-02-06 DIAGNOSIS — J101 Influenza due to other identified influenza virus with other respiratory manifestations: Secondary | ICD-10-CM | POA: Diagnosis not present

## 2018-02-06 DIAGNOSIS — O3680X0 Pregnancy with inconclusive fetal viability, not applicable or unspecified: Secondary | ICD-10-CM | POA: Diagnosis not present

## 2018-02-06 DIAGNOSIS — Z3A01 Less than 8 weeks gestation of pregnancy: Secondary | ICD-10-CM | POA: Insufficient documentation

## 2018-02-06 DIAGNOSIS — Z6790 Unspecified blood type, Rh positive: Secondary | ICD-10-CM

## 2018-02-06 DIAGNOSIS — E860 Dehydration: Secondary | ICD-10-CM

## 2018-02-06 DIAGNOSIS — O99511 Diseases of the respiratory system complicating pregnancy, first trimester: Secondary | ICD-10-CM | POA: Diagnosis not present

## 2018-02-06 DIAGNOSIS — O4690 Antepartum hemorrhage, unspecified, unspecified trimester: Secondary | ICD-10-CM

## 2018-02-06 DIAGNOSIS — O4691 Antepartum hemorrhage, unspecified, first trimester: Secondary | ICD-10-CM

## 2018-02-06 DIAGNOSIS — R05 Cough: Secondary | ICD-10-CM | POA: Diagnosis present

## 2018-02-06 LAB — CBC
HCT: 36.6 % (ref 36.0–46.0)
Hemoglobin: 12.1 g/dL (ref 12.0–15.0)
MCH: 28.3 pg (ref 26.0–34.0)
MCHC: 33.1 g/dL (ref 30.0–36.0)
MCV: 85.5 fL (ref 80.0–100.0)
Platelets: 145 10*3/uL — ABNORMAL LOW (ref 150–400)
RBC: 4.28 MIL/uL (ref 3.87–5.11)
RDW: 12.1 % (ref 11.5–15.5)
WBC: 2.6 10*3/uL — ABNORMAL LOW (ref 4.0–10.5)
nRBC: 0 % (ref 0.0–0.2)

## 2018-02-06 LAB — URINALYSIS, ROUTINE W REFLEX MICROSCOPIC
Bilirubin Urine: NEGATIVE
Glucose, UA: NEGATIVE mg/dL
Ketones, ur: 80 mg/dL — AB
Leukocytes, UA: NEGATIVE
Nitrite: NEGATIVE
Protein, ur: 100 mg/dL — AB
RBC / HPF: >50 RBC/hpf — ABNORMAL HIGH (ref 0–5)
Specific Gravity, Urine: 1.03 (ref 1.005–1.030)
pH: 6 (ref 5.0–8.0)

## 2018-02-06 LAB — WET PREP, GENITAL
CLUE CELLS WET PREP: NONE SEEN
Sperm: NONE SEEN
Trich, Wet Prep: NONE SEEN
WBC WET PREP: NONE SEEN
YEAST WET PREP: NONE SEEN

## 2018-02-06 LAB — INFLUENZA PANEL BY PCR (TYPE A & B)
Influenza A By PCR: NEGATIVE
Influenza B By PCR: POSITIVE — AB

## 2018-02-06 LAB — DIFFERENTIAL
BASOS PCT: 0 %
Basophils Absolute: 0 10*3/uL (ref 0.0–0.1)
Eosinophils Absolute: 0 10*3/uL (ref 0.0–0.5)
Eosinophils Relative: 0 %
Lymphocytes Relative: 39 %
Lymphs Abs: 1 10*3/uL (ref 0.7–4.0)
MONOS PCT: 9 %
Monocytes Absolute: 0.2 10*3/uL (ref 0.1–1.0)
Neutro Abs: 1.3 10*3/uL — ABNORMAL LOW (ref 1.7–7.7)
Neutrophils Relative %: 52 %

## 2018-02-06 LAB — HCG, QUANTITATIVE, PREGNANCY: hCG, Beta Chain, Quant, S: 828 m[IU]/mL — ABNORMAL HIGH (ref <5)

## 2018-02-06 MED ORDER — OSELTAMIVIR PHOSPHATE 75 MG PO CAPS
75.0000 mg | ORAL_CAPSULE | Freq: Two times a day (BID) | ORAL | Status: DC
  Administered 2018-02-06: 75 mg via ORAL
  Filled 2018-02-06 (×2): qty 1

## 2018-02-06 MED ORDER — OSELTAMIVIR PHOSPHATE 75 MG PO CAPS: 75.0000 mg | ORAL_CAPSULE | Freq: Two times a day (BID) | ORAL | 0 refills | Status: DC

## 2018-02-06 MED ORDER — METOCLOPRAMIDE HCL 5 MG/ML IJ SOLN
10.0000 mg | Freq: Once | INTRAMUSCULAR | Status: AC
  Administered 2018-02-06: 10 mg via INTRAVENOUS
  Filled 2018-02-06: qty 2

## 2018-02-06 MED ORDER — LACTATED RINGERS IV BOLUS
1000.0000 mL | Freq: Once | INTRAVENOUS | Status: AC
  Administered 2018-02-06: 1000 mL via INTRAVENOUS

## 2018-02-06 MED ORDER — ONDANSETRON 4 MG PO TBDP: 4.0000 mg | ORAL_TABLET | Freq: Three times a day (TID) | ORAL | 0 refills | Status: DC | PRN

## 2018-02-06 MED ORDER — DEXAMETHASONE SODIUM PHOSPHATE 10 MG/ML IJ SOLN
10.0000 mg | Freq: Once | INTRAMUSCULAR | Status: AC
  Administered 2018-02-06: 10 mg via INTRAVENOUS
  Filled 2018-02-06: qty 1

## 2018-02-06 MED ORDER — DIPHENHYDRAMINE HCL 50 MG/ML IJ SOLN
25.0000 mg | Freq: Once | INTRAMUSCULAR | Status: AC
  Administered 2018-02-06: 25 mg via INTRAVENOUS
  Filled 2018-02-06: qty 1

## 2018-02-06 NOTE — Discharge Instructions (Signed)
Vaginal Bleeding During Pregnancy, First Trimester  A small amount of bleeding (spotting) from the vagina is common during early pregnancy. Sometimes the bleeding is normal and does not cause problems. At other times, though, bleeding may be a sign of something serious. Tell your doctor about any bleeding from your vagina right away. Follow these instructions at home: Activity  Follow your doctor's instructions about how active you can be.  If needed, make plans for someone to help with your normal activities.  Do not have sex or orgasms until your doctor says that this is safe. General instructions  Take over-the-counter and prescription medicines only as told by your doctor.  Watch your condition for any changes.  Write down: ? The number of pads you use each day. ? How often you change pads. ? How soaked (saturated) your pads are.  Do not use tampons.  Do not douche.  If you pass any tissue from your vagina, save it to show to your doctor.  Keep all follow-up visits as told by your doctor. This is important. Contact a doctor if:  You have vaginal bleeding at any time while you are pregnant.  You have cramps.  You have a fever. Get help right away if:  You have very bad cramps in your back or belly (abdomen).  You pass large clots or a lot of tissue from your vagina.  Your bleeding gets worse.  You feel light-headed.  You feel weak.  You pass out (faint).  You have chills.  You are leaking fluid from your vagina.  You have a gush of fluid from your vagina. Summary  Sometimes vaginal bleeding during pregnancy is normal and does not cause problems. At other times, bleeding may be a sign of something serious.  Tell your doctor about any bleeding from your vagina right away.  Follow your doctor's instructions about how active you can be. You may need someone to help you with your normal activities. This information is not intended to replace advice given to  you by your health care provider. Make sure you discuss any questions you have with your health care provider. Document Released: 06/22/2013 Document Revised: 05/09/2016 Document Reviewed: 05/09/2016 Elsevier Interactive Patient Education  2019 [employer] Inc[employer]   Influenza, Adult Influenza is also called "the flu." It is an infection in the lungs, nose, and throat (respiratory tract). It is caused by a virus. The flu causes symptoms that are similar to symptoms of a cold. It also causes a high fever and body aches. The flu spreads easily from person to person (is contagious). Getting a flu shot (influenza vaccination) every year is the best way to prevent the flu. What are the causes? This condition is caused by the influenza virus. You can get the virus by:  Breathing in droplets that are in the air from the cough or sneeze of a person who has the virus.  Touching something that has the virus on it (is contaminated) and then touching your mouth, nose, or eyes. What increases the risk? Certain things may make you more likely to get the flu. These include:  Not washing your hands often.  Having close contact with many people during cold and flu season.  Touching your mouth, eyes, or nose without first washing your hands.  Not getting a flu shot every year. You may have a higher risk for the flu, along with serious problems such as a lung infection (pneumonia), if you:  Are older than 65.  Are  pregnant.  Have a weakened disease-fighting system (immune system) because of a disease or taking certain medicines.  Have a long-term (chronic) illness, such as: ? Heart, kidney, or lung disease. ? Diabetes. ? Asthma.  Have a liver disorder.  Are very overweight (morbidly obese).  Have anemia. This is a condition that affects your red blood cells. What are the signs or symptoms? Symptoms usually begin suddenly and last 4-14 days. They may include:  Fever and chills.  Headaches, body  aches, or muscle aches.  Sore throat.  Cough.  Runny or stuffy (congested) nose.  Chest discomfort.  Not wanting to eat as much as normal (poor appetite).  Weakness or feeling tired (fatigue).  Dizziness.  Feeling sick to your stomach (nauseous) or throwing up (vomiting). How is this treated? If the flu is found early, you can be treated with medicine that can help reduce how bad the illness is and how long it lasts (antiviral medicine). This may be given by mouth (orally) or through an IV tube. Taking care of yourself at home can help your symptoms get better. Your doctor may suggest:  Taking over-the-counter medicines.  Drinking plenty of fluids. The flu often goes away on its own. If you have very bad symptoms or other problems, you may be treated in a hospital. Follow these instructions at home:     Activity  Rest as needed. Get plenty of sleep.  Stay home from work or school as told by your doctor. ? Do not leave home until you do not have a fever for 24 hours without taking medicine. ? Leave home only to visit your doctor. Eating and drinking  Take an ORS (oral rehydration solution). This is a drink that is sold at pharmacies and stores.  Drink enough fluid to keep your pee (urine) pale yellow.  Drink clear fluids in small amounts as you are able. Clear fluids include: ? Water. ? Ice chips. ? Fruit juice that has water added (diluted fruit juice). ? Low-calorie sports drinks.  Eat bland, easy-to-digest foods in small amounts as you are able. These foods include: ? Bananas. ? Applesauce. ? Rice. ? Lean meats. ? Toast. ? Crackers.  Do not eat or drink: ? Fluids that have a lot of sugar or caffeine. ? Alcohol. ? Spicy or fatty foods. General instructions  Take over-the-counter and prescription medicines only as told by your doctor.  Use a cool mist humidifier to add moisture to the air in your home. This can make it easier for you to  breathe.  Cover your mouth and nose when you cough or sneeze.  Wash your hands with soap and water often, especially after you cough or sneeze. If you cannot use soap and water, use alcohol-based hand sanitizer.  Keep all follow-up visits as told by your doctor. This is important. How is this prevented?   Get a flu shot every year. You may get the flu shot in late summer, fall, or winter. Ask your doctor when you should get your flu shot.  Avoid contact with people who are sick during fall and winter (cold and flu season). Contact a doctor if:  You get new symptoms.  You have: ? Chest pain. ? Watery poop (diarrhea). ? A fever.  Your cough gets worse.  You start to have more mucus.  You feel sick to your stomach.  You throw up. Get help right away if you:  Have shortness of breath.  Have trouble breathing.  Have skin or  nails that turn a bluish color.  Have very bad pain or stiffness in your neck.  Get a sudden headache.  Get sudden pain in your face or ear.  Cannot eat or drink without throwing up. Summary  Influenza ("the flu") is an infection in the lungs, nose, and throat. It is caused by a virus.  Take over-the-counter and prescription medicines only as told by your doctor.  Getting a flu shot every year is the best way to avoid getting the flu. This information is not intended to replace advice given to you by your health care provider. Make sure you discuss any questions you have with your health care provider. Document Released: 11/15/2007 Document Revised: 07/24/2017 Document Reviewed: 07/24/2017 Elsevier Interactive Patient Education  2019 [employer].

## 2018-02-06 NOTE — MAU Provider Note (Signed)
History     CSN: [id number]  Arrival date and time: 02/06/18 1526   First Provider Initiated Contact with Patient 02/06/18 1600      Chief Complaint  Patient presents with  . Influenza  . Vaginal Bleeding   G3P2002 @[redacted]w[redacted]d  here with HA, cough, body aches, and VB. Reports VB started today, she saw red on the toilet paper today. Occasional having a sharp pain in her lower abdomen. Cough, HA and body aches started 3 days ago. Reports fever and chills but has not checked with a thermometer. Her kids have had a cough and runny nose.    OB History    Gravida  3   Para  2   Term  2   Preterm      AB      Living  2     SAB      TAB      Ectopic      Multiple      Live Births  2           Past Medical History:  Diagnosis Date  . Anxiety   . Diabetes mellitus without complication ([place])    pre diabetic  . Heart murmur   . Hypertension    chronic    Past Surgical History:  Procedure Laterality Date  . WISDOM TOOTH EXTRACTION      Family History  Problem Relation Age of Onset  . Diabetes Other   . Healthy Mother   . Healthy Father     Social History   Tobacco Use  . Smoking status: Never Smoker  . Smokeless tobacco: Never Used  Substance Use Topics  . Alcohol use: Yes    Comment: occasionally   . Drug use: No    Comment: "not since I got pregnant"    Allergies:  Allergies  Allergen Reactions  . Azithromycin Nausea And Vomiting    Medications Prior to Admission  Medication Sig Dispense Refill Last Dose  . acetaminophen (TYLENOL) 325 MG tablet Take 650 mg by mouth every 6 (six) hours as needed for moderate pain or headache.   Past Week at Unknown time  . aspirin 81 MG chewable tablet Chew 81 mg by mouth daily.   More than a month at Unknown time  . hydrochlorothiazide (HYDRODIURIL) 25 MG tablet Take 1 tablet (25 mg total) by mouth daily. 30 tablet 0   . Prenatal Vit-Fe Fumarate-FA (PRENATAL COMPLETE) 14-0.4 MG TABS Take 2 tablets by mouth  daily. 60 each 0 More than a month at Unknown time    Review of Systems  Constitutional: Positive for chills and fever.  HENT: Positive for congestion and ear pain. Negative for sore throat.   Respiratory: Positive for cough.   Gastrointestinal: Positive for abdominal pain and diarrhea. Negative for nausea and vomiting.  Genitourinary: Positive for vaginal bleeding. Negative for dysuria.  Musculoskeletal: Positive for myalgias.  Neurological: Positive for headaches.   Physical Exam   Blood pressure 131/84, pulse (!) 111, temperature 99.8 F (37.7 C), temperature source Oral, resp. rate 18, last menstrual period 01/01/2018, SpO2 96 %, currently breastfeeding.  Physical Exam  Constitutional: She is oriented to person, place, and time. She appears well-developed and well-nourished. No distress.  HENT:  Head: Normocephalic and atraumatic.  Nose: Nose normal.  Mouth/Throat: Uvula is midline, oropharynx is clear and moist and mucous membranes are normal.  Eyes: Pupils are equal, round, and reactive to light.  Neck: Normal range of motion.  Respiratory: Effort  normal. No respiratory distress.  GI: Soft. She exhibits no distension and no mass. There is no abdominal tenderness. There is no rebound and no guarding.  Genitourinary:    Genitourinary Comments: External: no lesions or erythema Vagina: rugated, pink, moist, small bloody discharge Uterus: non enlarged, anteverted, non tender, no CMT Adnexae: no masses, no tenderness left, no tenderness right Cervix nml, closed    Musculoskeletal: Normal range of motion.  Neurological: She is alert and oriented to person, place, and time.  Skin: Skin is warm and dry.  Psychiatric: She has a normal mood and affect.   Results for orders placed or performed during the hospital encounter of 02/06/18 (from the past 24 hour(s))  Urinalysis, Routine w reflex microscopic     Status: Abnormal   Collection Time: 02/06/18  3:43 PM  Result Value Ref  Range   Color, Urine AMBER (A) YELLOW   APPearance HAZY (A) CLEAR   Specific Gravity, Urine 1.030 1.005 - 1.030   pH 6.0 5.0 - 8.0   Glucose, UA NEGATIVE NEGATIVE mg/dL   Hgb urine dipstick LARGE (A) NEGATIVE   Bilirubin Urine NEGATIVE NEGATIVE   Ketones, ur 80 (A) NEGATIVE mg/dL   Protein, ur 100 (A) NEGATIVE mg/dL   Nitrite NEGATIVE NEGATIVE   Leukocytes, UA NEGATIVE NEGATIVE   RBC / HPF >50 (H) 0 - 5 RBC/hpf   WBC, UA 6-10 0 - 5 WBC/hpf   Bacteria, UA RARE (A) NONE SEEN   Squamous Epithelial / LPF 11-20 0 - 5   Mucus PRESENT   CBC     Status: Abnormal   Collection Time: 02/06/18  4:12 PM  Result Value Ref Range   WBC 2.6 (L) 4.0 - 10.5 K/uL   RBC 4.28 3.87 - 5.11 MIL/uL   Hemoglobin 12.1 12.0 - 15.0 g/dL   HCT 36.6 36.0 - 46.0 %   MCV 85.5 80.0 - 100.0 fL   MCH 28.3 26.0 - 34.0 pg   MCHC 33.1 30.0 - 36.0 g/dL   RDW 12.1 11.5 - 15.5 %   Platelets 145 (L) 150 - 400 K/uL   nRBC 0.0 0.0 - 0.2 %  hCG, quantitative, pregnancy     Status: Abnormal   Collection Time: 02/06/18  4:12 PM  Result Value Ref Range   hCG, Beta Chain, Quant, S 828 (H) <5 mIU/mL  Differential     Status: Abnormal   Collection Time: 02/06/18  4:12 PM  Result Value Ref Range   Neutrophils Relative % 52 %   Neutro Abs 1.3 (L) 1.7 - 7.7 K/uL   Lymphocytes Relative 39 %   Lymphs Abs 1.0 0.7 - 4.0 K/uL   Monocytes Relative 9 %   Monocytes Absolute 0.2 0.1 - 1.0 K/uL   Eosinophils Relative 0 %   Eosinophils Absolute 0.0 0.0 - 0.5 K/uL   Basophils Relative 0 %   Basophils Absolute 0.0 0.0 - 0.1 K/uL  Wet prep, genital     Status: None   Collection Time: 02/06/18  4:24 PM  Result Value Ref Range   Yeast Wet Prep HPF POC NONE SEEN NONE SEEN   Trich, Wet Prep NONE SEEN NONE SEEN   Clue Cells Wet Prep HPF POC NONE SEEN NONE SEEN   WBC, Wet Prep HPF POC NONE SEEN NONE SEEN   Sperm NONE SEEN   Influenza panel by PCR (type A & B)     Status: Abnormal   Collection Time: 02/06/18  4:33 PM  Result Value Ref  Range   Influenza A By PCR NEGATIVE NEGATIVE   Influenza B By PCR POSITIVE (A) NEGATIVE   US Ob Transvaginal  Result Date: 02/06/2018 CLINICAL DATA:  Vaginal bleeding.  Positive pregnancy test. EXAM: TRANSVAGINAL OB ULTRASOUND TECHNIQUE: Transvaginal ultrasound was performed for complete evaluation of the gestation as well as the maternal uterus, adnexal regions, and pelvic cul-de-sac. COMPARISON:  Pelvic ultrasound 01/30/2018 FINDINGS: Intrauterine gestational sac: None Yolk sac:  Not Visualized. Embryo:  Not Visualized. Cardiac Activity: Not Visualized. Maternal uterus/adnexae: Mobile echogenic debris within the endometrial cavity suggestive of blood products. Left ovarian cyst. Normal appearance of the right ovary. Small amount of free fluid in the pelvis. IMPRESSION: Mobile echogenic material within the endometrial cavity suggestive of blood products. No intrauterine gestation identified. In the setting of positive pregnancy test and no definite intrauterine pregnancy, this reflects a pregnancy of unknown location. Differential considerations include early normal IUP, abnormal IUP, or nonvisualized ectopic pregnancy. Differentiation is achieved with serial beta HCG supplemented by repeat sonography as clinically warranted. Electronically Signed   By: Lovey Newcomer M.D.   On: 02/06/2018 19:00   MAU Course  Procedures LR Benadryl Reglan Decadron Tamiflu  MDM Labs ordered and reviewed. Flu B positive, will treat with Tamifu. Pt feeling better, HA resolved. No IUP or adnexal mass seen on [place], findings could indicate early pregnancy, ectopic pregnancy, or failed pregnancy-discussed with pt. Will follow quant in 48 hrs. Stable for discharge home.   Assessment and Plan   1. Pregnancy of unknown anatomic location   2. Vaginal bleeding in pregnancy   3. Blood type, Rh positive   4. Influenza B   5. Dehydration    Discharge home Follow up in MAU in 2 days for repeat [facility] Ectopic/return  precautions Rx Tamiflu Rx Zofran  Allergies as of 02/06/2018      Reactions   Azithromycin Nausea And Vomiting      Medication List    STOP taking these medications   aspirin 81 MG chewable tablet   hydrochlorothiazide 25 MG tablet Commonly known as:  HYDRODIURIL     TAKE these medications   acetaminophen 325 MG tablet Commonly known as:  TYLENOL Take 650 mg by mouth every 6 (six) hours as needed for moderate pain or headache.   ondansetron 4 MG disintegrating tablet Commonly known as:  ZOFRAN ODT Take 1 tablet (4 mg total) by mouth every 8 (eight) hours as needed for nausea or vomiting.   oseltamivir 75 MG capsule Commonly known as:  TAMIFLU Take 1 capsule (75 mg total) by mouth 2 (two) times daily. Start taking on:  February 07, 2018   PRENATAL COMPLETE 14-0.4 MG Tabs Take 2 tablets by mouth daily.      Julianne Handler, CNM 02/06/2018, 7:26 PM

## 2018-02-06 NOTE — MAU Note (Signed)
Pt stated she has been having flu like symptoms since Monday. Body ache, headache, nasal congestion, hot and cold chills. Having trouble getting out of bed due to weakness.  Went to [facility] today  And wiped and saw some blood.

## 2018-02-07 LAB — GC/CHLAMYDIA PROBE AMP (~~LOC~~) NOT AT ARMC
Chlamydia: NEGATIVE
Neisseria Gonorrhea: NEGATIVE

## 2018-03-26 ENCOUNTER — Inpatient Hospital Stay (HOSPITAL_COMMUNITY): Payer: Medicaid Other

## 2018-03-26 ENCOUNTER — Inpatient Hospital Stay (HOSPITAL_COMMUNITY)
Admission: AD | Admit: 2018-03-26 | Discharge: 2018-03-26 | Disposition: A | Discharge status: Home / Self Care | Payer: Medicaid Other | Attending: Obstetrics & Gynecology | Admitting: Obstetrics & Gynecology

## 2018-03-26 ENCOUNTER — Encounter (HOSPITAL_COMMUNITY): Payer: Self-pay

## 2018-03-26 ENCOUNTER — Other Ambulatory Visit: Payer: Self-pay

## 2018-03-26 DIAGNOSIS — Z8632 Personal history of gestational diabetes: Secondary | ICD-10-CM | POA: Diagnosis present

## 2018-03-26 DIAGNOSIS — O209 Hemorrhage in early pregnancy, unspecified: Secondary | ICD-10-CM | POA: Diagnosis not present

## 2018-03-26 DIAGNOSIS — O4691 Antepartum hemorrhage, unspecified, first trimester: Secondary | ICD-10-CM | POA: Diagnosis not present

## 2018-03-26 DIAGNOSIS — Z3A12 12 weeks gestation of pregnancy: Secondary | ICD-10-CM | POA: Diagnosis not present

## 2018-03-26 DIAGNOSIS — O4690 Antepartum hemorrhage, unspecified, unspecified trimester: Secondary | ICD-10-CM

## 2018-03-26 DIAGNOSIS — Z3A10 10 weeks gestation of pregnancy: Secondary | ICD-10-CM

## 2018-03-26 HISTORY — DX: Gestational (pregnancy-induced) hypertension without significant proteinuria, unspecified trimester: O13.9

## 2018-03-26 LAB — HCG, QUANTITATIVE, PREGNANCY: hCG, Beta Chain, Quant, S: 110826 m[IU]/mL — ABNORMAL HIGH (ref <5)

## 2018-03-26 LAB — CBC
HCT: 33.6 % — ABNORMAL LOW (ref 36.0–46.0)
Hemoglobin: 11.3 g/dL — ABNORMAL LOW (ref 12.0–15.0)
MCH: 28.9 pg (ref 26.0–34.0)
MCHC: 33.6 g/dL (ref 30.0–36.0)
MCV: 85.9 fL (ref 80.0–100.0)
NRBC: 1.2 % — AB (ref 0.0–0.2)
Platelets: 182 10*3/uL (ref 150–400)
RBC: 3.91 MIL/uL (ref 3.87–5.11)
RDW: 12.9 % (ref 11.5–15.5)
WBC: 4.3 10*3/uL (ref 4.0–10.5)

## 2018-03-26 NOTE — MAU Note (Signed)
Pt arrives via EMS with complaint of sudden onset of bright red bleeding, denies pain. Moderate amt of bright red bleeding noted on admission.

## 2018-03-26 NOTE — MAU Provider Note (Signed)
History     CSN: [id number]  Arrival date and time: 03/26/18 1743   First Provider Initiated Contact with Patient 03/26/18 1806      Chief Complaint  Patient presents with  . Vaginal Bleeding   HPI  [name] is a 23 y.o. year old G56P2002 female at [redacted]w[redacted]d weeks gestation who presents to MAU via EMS reporting that she gushed BRB from her vagina while she was braiding her hair. She denies pain, dizziness, SOB, fever, or N/V. Triage RN notes moderate amount of BRB on a pad upon an admission. She receives [facility] at a [facility] OB[facility]GYN office (Dr. Posey Pronto).  Past Medical History:  Diagnosis Date  . Anxiety   . Diabetes mellitus without complication ([place])    pre diabetic  . Heart murmur   . Hypertension    chronic  . Pregnancy induced hypertension     Past Surgical History:  Procedure Laterality Date  . WISDOM TOOTH EXTRACTION      Family History  Problem Relation Age of Onset  . Diabetes Other   . Healthy Mother   . Healthy Father     Social History   Tobacco Use  . Smoking status: Never Smoker  . Smokeless tobacco: Never Used  Substance Use Topics  . Alcohol use: Not Currently    Comment: occasionally   . Drug use: No    Comment: "not since I got pregnant"    Allergies:  Allergies  Allergen Reactions  . Azithromycin Nausea And Vomiting    Medications Prior to Admission  Medication Sig Dispense Refill Last Dose  . acetaminophen (TYLENOL) 325 MG tablet Take 650 mg by mouth every 6 (six) hours as needed for moderate pain or headache.   Past Week at Unknown time  . ondansetron (ZOFRAN ODT) 4 MG disintegrating tablet Take 1 tablet (4 mg total) by mouth every 8 (eight) hours as needed for nausea or vomiting. 10 tablet 0   . oseltamivir (TAMIFLU) 75 MG capsule Take 1 capsule (75 mg total) by mouth 2 (two) times daily. 10 capsule 0   . Prenatal Vit-Fe Fumarate-FA (PRENATAL COMPLETE) 14-0.4 MG TABS Take 2 tablets by mouth daily. 60 each 0 More than a month at  Unknown time    Review of Systems  Constitutional: Negative.   HENT: Negative.   Eyes: Negative.   Respiratory: Negative.   Cardiovascular: Negative.   Gastrointestinal: Negative.   Endocrine: Negative.   Genitourinary: Positive for vaginal bleeding.  Musculoskeletal: Negative.   Skin: Negative.   Allergic/Immunologic: Negative.   Neurological: Negative.   Hematological: Negative.   Psychiatric/Behavioral: Negative.    Physical Exam   Blood pressure (!) 141/89, pulse 85, temperature 98.5 F (36.9 C), resp. rate 16, height 5\' 2"  (1.575 m), weight 101.2 kg, last menstrual period 01/01/2018, SpO2 100 %.  Physical Exam  Nursing note and vitals reviewed. Constitutional: She is oriented to person, place, and time. She appears well-developed and well-nourished.  HENT:  Head: Normocephalic and atraumatic.  Eyes: Pupils are equal, round, and reactive to light.  Neck: Normal range of motion.  Cardiovascular: Normal rate, regular rhythm, normal heart sounds and intact distal pulses.  Respiratory: Effort normal and breath sounds normal.  GI: Soft. Bowel sounds are normal.  Genitourinary:    Genitourinary Comments: Medium sized blood clot covering introitus removed with large cotton swab, moderate amount of mucousy blood and 2 additional medium sized blood clots   Musculoskeletal: Normal range of motion.  Neurological: She is  alert and oriented to person, place, and time. She has normal reflexes.  Skin: Skin is warm and dry.  Psychiatric: She has a normal mood and affect. Her behavior is normal. Judgment and thought content normal.    MAU Course  Procedures  MDM CBC HCG  Results for orders placed or performed during the hospital encounter of 03/26/18 (from the past 48 hour(s))  hCG, quantitative, pregnancy     Status: Abnormal   Collection Time: 03/26/18  6:43 PM  Result Value Ref Range   hCG, Beta Chain, Quant, S 110,826 (H) <5 mIU/mL    Comment:          GEST. AGE       CONC.  (mIU/mL)   <=1 WEEK        5 - 50     2 WEEKS       50 - 500     3 WEEKS       100 - 10,000     4 WEEKS     1,000 - 30,000     5 WEEKS     3,500 - 115,000   6-8 WEEKS     12,000 - 270,000    12 WEEKS     15,000 - 220,000        FEMALE AND NON-PREGNANT FEMALE:     LESS THAN 5 mIU/mL Performed at [facility], [address], [place], [place] [address]   CBC     Status: Abnormal   Collection Time: 03/26/18  6:43 PM  Result Value Ref Range   WBC 4.3 4.0 - 10.5 K/uL   RBC 3.91 3.87 - 5.11 MIL/uL   Hemoglobin 11.3 (L) 12.0 - 15.0 g/dL   HCT 33.6 (L) 36.0 - 46.0 %   MCV 85.9 80.0 - 100.0 fL   MCH 28.9 26.0 - 34.0 pg   MCHC 33.6 30.0 - 36.0 g/dL   RDW 12.9 11.5 - 15.5 %   Platelets 182 150 - 400 K/uL   nRBC 1.2 (H) 0.0 - 0.2 %    Comment: Performed at [facility], [address] [place], [place] [address]   US Ob Less Than 14 Weeks With Ob Transvaginal  Result Date: 03/26/2018 CLINICAL DATA:  Bleeding. Followup from 12/19. Estimated gestational age by LMP is 12 weeks 0 days. Quantitative beta HCG is not provided. EXAM: OBSTETRIC <14 WK [place] AND TRANSVAGINAL OB US TECHNIQUE: Both transabdominal and transvaginal ultrasound examinations were performed for complete evaluation of the gestation as well as the maternal uterus, adnexal regions, and pelvic cul-de-sac. Transvaginal technique was performed to assess early pregnancy. COMPARISON:  02/06/2018 FINDINGS: Intrauterine gestational sac: A single intrauterine gestational sac is present. Yolk sac:  Yolk sac is not identified. Embryo:  Fetal pole is present. Cardiac Activity: Fetal cardiac activity is observed. Heart Rate: 160 bpm CRL: 40.4 mm   10 w   6 d  [place] EDC: 10/16/2018 Subchorionic hemorrhage:  None visualized. Maternal uterus/adnexae: Uterus is anteverted. No myometrial mass lesions are identified. Both ovaries are visualized and appear normal. No abnormal adnexal masses. No abnormal pelvic fluid. IMPRESSION: Single intrauterine  pregnancy. Estimated gestational age by crown-rump length is 10 weeks 6 days. No acute complication is suggested sonographically. Electronically Signed   By: Lucienne Capers M.D.   On: 03/26/2018 19:42    Assessment and Plan  Vaginal bleeding in pregnancy, first trimester  - Advised to call her OB (Dr. Posey Pronto) to notify him of her MAU visit and to  see if she needs to be seen sooner than scheduled - Return to MAU:   If you have heavier bleeding that soaks through more that 2 pads per hour for an hour or more  If you bleed so much that you feel like you might pass out or you do pass out  If you have significant abdominal pain that is not improved with Tylenol   If you develop a fever > 100.5  - Discharge patient - Patient verbalized an understanding of the plan of care and agrees.   Laury Deep, MSN, CNM 03/26/2018, 6:08 PM

## 2018-08-17 ENCOUNTER — Inpatient Hospital Stay (HOSPITAL_COMMUNITY)
Admission: AD | Admit: 2018-08-17 | Discharge: 2018-08-17 | Disposition: A | Discharge status: Home / Self Care | Payer: Medicaid Other | Attending: Obstetrics and Gynecology | Admitting: Obstetrics and Gynecology

## 2018-08-17 ENCOUNTER — Encounter (HOSPITAL_COMMUNITY): Payer: Self-pay | Admitting: Advanced Practice Midwife

## 2018-08-17 ENCOUNTER — Other Ambulatory Visit: Payer: Self-pay

## 2018-08-17 DIAGNOSIS — Z8619 Personal history of other infectious and parasitic diseases: Secondary | ICD-10-CM

## 2018-08-17 DIAGNOSIS — O99333 Smoking (tobacco) complicating pregnancy, third trimester: Secondary | ICD-10-CM | POA: Insufficient documentation

## 2018-08-17 DIAGNOSIS — O26893 Other specified pregnancy related conditions, third trimester: Secondary | ICD-10-CM

## 2018-08-17 DIAGNOSIS — F329 Major depressive disorder, single episode, unspecified: Secondary | ICD-10-CM

## 2018-08-17 DIAGNOSIS — F17210 Nicotine dependence, cigarettes, uncomplicated: Secondary | ICD-10-CM | POA: Insufficient documentation

## 2018-08-17 DIAGNOSIS — Z3A31 31 weeks gestation of pregnancy: Secondary | ICD-10-CM | POA: Diagnosis not present

## 2018-08-17 DIAGNOSIS — Z7982 Long term (current) use of aspirin: Secondary | ICD-10-CM | POA: Diagnosis not present

## 2018-08-17 DIAGNOSIS — O09293 Supervision of pregnancy with other poor reproductive or obstetric history, third trimester: Secondary | ICD-10-CM

## 2018-08-17 DIAGNOSIS — Z8759 Personal history of other complications of pregnancy, childbirth and the puerperium: Secondary | ICD-10-CM

## 2018-08-17 DIAGNOSIS — R109 Unspecified abdominal pain: Secondary | ICD-10-CM | POA: Diagnosis not present

## 2018-08-17 DIAGNOSIS — O10013 Pre-existing essential hypertension complicating pregnancy, third trimester: Secondary | ICD-10-CM | POA: Insufficient documentation

## 2018-08-17 DIAGNOSIS — I10 Essential (primary) hypertension: Secondary | ICD-10-CM

## 2018-08-17 DIAGNOSIS — Z3689 Encounter for other specified antenatal screening: Secondary | ICD-10-CM | POA: Diagnosis not present

## 2018-08-17 DIAGNOSIS — F32A Depression, unspecified: Secondary | ICD-10-CM

## 2018-08-17 HISTORY — DX: Personal history of other infectious and parasitic diseases: Z86.19

## 2018-08-17 HISTORY — DX: Essential (primary) hypertension: I10

## 2018-08-17 LAB — URINALYSIS, ROUTINE W REFLEX MICROSCOPIC
Bacteria, UA: NONE SEEN
Bilirubin Urine: NEGATIVE
Glucose, UA: NEGATIVE mg/dL
Hgb urine dipstick: NEGATIVE
Ketones, ur: NEGATIVE mg/dL
Leukocytes,Ua: NEGATIVE
Nitrite: NEGATIVE
Protein, ur: 30 mg/dL — AB
Specific Gravity, Urine: 1.027 (ref 1.005–1.030)
pH: 5 (ref 5.0–8.0)

## 2018-08-17 LAB — WET PREP, GENITAL
Clue Cells Wet Prep HPF POC: NONE SEEN
Sperm: NONE SEEN
Trich, Wet Prep: NONE SEEN
Yeast Wet Prep HPF POC: NONE SEEN

## 2018-08-17 NOTE — Discharge Instructions (Signed)
Abdominal Pain During Pregnancy  Belly (abdominal) pain is common during pregnancy. There are many possible causes. Most of the time, it is not a serious problem. Other times, it can be a sign that something is wrong with the pregnancy. Always tell your doctor if you have belly pain. Follow these instructions at home:  Do not have sex or put anything in your vagina until your pain goes away completely.  Get plenty of rest until your pain gets better.  Drink enough fluid to keep your pee (urine) pale yellow.  Take over-the-counter and prescription medicines only as told by your doctor.  Keep all follow-up visits as told by your doctor. This is important. Contact a doctor if:  Your pain continues or gets worse after resting.  You have lower belly pain that: ? Comes and goes at regular times. ? Spreads to your back. ? Feels like menstrual cramps.  You have pain or burning when you pee (urinate). Get help right away if:  You have a fever or chills.  You have vaginal bleeding.  You are leaking fluid from your vagina.  You are passing tissue from your vagina.  You throw up (vomit) for more than 24 hours.  You have watery poop (diarrhea) for more than 24 hours.  Your baby is moving less than usual.  You feel very weak or faint.  You have shortness of breath.  You have very bad pain in your upper belly. Summary  Belly (abdominal) pain is common during pregnancy. There are many possible causes.  If you have belly pain during pregnancy, tell your doctor right away.  Keep all follow-up visits as told by your doctor. This is important. This information is not intended to replace advice given to you by your health care provider. Make sure you discuss any questions you have with your health care provider. Document Released: 01/24/2009 Document Revised: 05/26/2018 Document Reviewed: 05/10/2016 Elsevier Patient Education  2020 [place] [facility] [place] @ [facility]   Phone: [place] for [place] @ [facility]   Phone: [place] @[place]       Phone: [phone]            [facility] [place] @ [place]     Phone: [place] for [place] @ [employer]   Phone: [place] for [place] @ [facility]  Phone: [place] for [place] @ [facility] Phone: [place] [facility]  Phone: [facility]  Phone: [place] Phone: [phone]  [facility] Phone: [phone]  [facility] Phone: [phone]  [facility] Phone: [phone]  [facility]  Phone: [phone]

## 2018-08-17 NOTE — MAU Provider Note (Signed)
History     CSN: [id number]  Arrival date and time: 08/17/18 0849   None   Chief Complaint  Patient presents with  . Abdominal Pain   HPI Margaret Ingram is a 23 y.o. G3P2002 at [redacted]w[redacted]d who presents to MAU with chief complaints of abdominal pain and vulvar swelling. She denies vaginal bleeding, leaking of fluid, decreased fetal movement, fever, falls, or recent illness.   Abdominal Pain She denies pain upon arrival in MAU but reports an episode of generalized abdominal pain early this  morning. This is a new problem, onset around 0400. She also reports sexual intercourse late last night but denies pain during intercourse or any time immediately afterwards. She states she lost her mucus plug last week and her [facility] provider advised her to "take it easy". She did not take medication or try other treatments for her earlier episode of pain.  Vulvar Swelling Patient states she felt as if her labia were "a little swollen" so she asked her FOB to check. She states he inserted one finger into her vagina and told her she "looked swollen and bulged out".   Chronic Hypertension Patient's pregnancy is complicated by [facility] and history of Superimposed Preeclampsia. She states she is not currently on blood pressure medication. She denies headache, visual disturbances, RUQ/upper abdominal pain, new onset swelling or weight gain. She is complaint with daily Aspirin 81mg  as advised.  Patient is a cigarette smoker and reports smoking a single cigarette late last night.  Patient receives prenatal care with Dr. Posey Pronto at [facility] in [place], [place]. She reports that she has had continuous care there and was told at her visit last week that she had started to dilate.  OB History    Gravida  3   Para  2   Term  2   Preterm      AB      Living  2     SAB      TAB      Ectopic      Multiple      Live Births  2           Past Medical History:  Diagnosis Date  . Anxiety   .  Diabetes mellitus without complication ([place])    pre diabetic  . Heart murmur   . Hypertension    chronic  . Pregnancy induced hypertension   . Preterm contractions 06/12/2017    Past Surgical History:  Procedure Laterality Date  . WISDOM TOOTH EXTRACTION      Family History  Problem Relation Age of Onset  . Diabetes Other   . Healthy Mother   . Healthy Father     Social History   Tobacco Use  . Smoking status: Never Smoker  . Smokeless tobacco: Never Used  Substance Use Topics  . Alcohol use: Not Currently    Comment: occasionally   . Drug use: No    Comment: "not since I got pregnant"    Allergies:  Allergies  Allergen Reactions  . Azithromycin Nausea And Vomiting    No medications prior to admission.    Review of Systems  Constitutional: Negative for chills, fatigue and fever.  Eyes: Negative for visual disturbance.  Gastrointestinal: Positive for abdominal pain.  Genitourinary: Negative for dyspareunia, dysuria, flank pain, vaginal bleeding, vaginal discharge and vaginal pain.  Musculoskeletal: Negative for back pain.  Neurological: Negative for syncope, weakness and headaches.  All other systems reviewed and are negative.  Physical  Exam   Height 5\' 2"  (1.575 m), weight 111.3 kg, last menstrual period 01/01/2018, currently breastfeeding.  Physical Exam  Nursing note and vitals reviewed. Constitutional: She is oriented to person, place, and time. She appears well-developed and well-nourished.  Cardiovascular: Normal rate.  Respiratory: Effort normal. No respiratory distress.  GI: Soft. She exhibits no distension. There is no abdominal tenderness. There is no rebound and no guarding.  Gravid  Genitourinary:    Vagina normal.     No vaginal discharge.     Genitourinary Comments: No evidence of abnormal discharge or friability on exam. No CMT. Internal cervical os is FT    Neurological: She is alert and oriented to person, place, and time.  Skin: Skin  is warm and dry.  Psychiatric: She has a normal mood and affect. Her behavior is normal. Judgment and thought content normal.    MAU Course/MDM  Procedures: sterile speculum exam  --Reactive tracing: baseline 130, moderate variability, positive accels, no decels --Toco: quiet --Hx Term SVD x 2 --No severe range BPs, no severe signs or symptoms --Prenatal encounters only visible through 03/26/18. Reviewed by me --No concerning findings on physical exam, labs collected today, not laboring  Patient Vitals for the past 24 hrs:  BP Temp Temp src Pulse Resp SpO2 Height Weight  08/17/18 0947 (!) 143/90 - - 86 - - - -  08/17/18 0930 (!) 155/94 - - 88 - 100 % - -  08/17/18 0921 (!) 153/86 98.4 F (36.9 C) Oral 92 18 100 % - -  08/17/18 1779 - - - - - - 5\' 2"  (1.575 m) 111.3 kg    Results for orders placed or performed during the hospital encounter of 08/17/18 (from the past 24 hour(s))  Urinalysis, Routine w reflex microscopic     Status: Abnormal   Collection Time: 08/17/18  9:11 AM  Result Value Ref Range   Color, Urine YELLOW YELLOW   APPearance CLEAR CLEAR   Specific Gravity, Urine 1.027 1.005 - 1.030   pH 5.0 5.0 - 8.0   Glucose, UA NEGATIVE NEGATIVE mg/dL   Hgb urine dipstick NEGATIVE NEGATIVE   Bilirubin Urine NEGATIVE NEGATIVE   Ketones, ur NEGATIVE NEGATIVE mg/dL   Protein, ur 30 (A) NEGATIVE mg/dL   Nitrite NEGATIVE NEGATIVE   Leukocytes,Ua NEGATIVE NEGATIVE   RBC / HPF 0-5 0 - 5 RBC/hpf   WBC, UA 0-5 0 - 5 WBC/hpf   Bacteria, UA NONE SEEN NONE SEEN   Squamous Epithelial / LPF 0-5 0 - 5   Mucus PRESENT   Wet prep, genital     Status: Abnormal   Collection Time: 08/17/18  9:43 AM   Specimen: Thin Prep Cervical/Endocervical  Result Value Ref Range   Yeast Wet Prep HPF POC NONE SEEN NONE SEEN   Trich, Wet Prep NONE SEEN NONE SEEN   Clue Cells Wet Prep HPF POC NONE SEEN NONE SEEN   WBC, Wet Prep HPF POC FEW (A) NONE SEEN   Sperm NONE SEEN    Assessment and Plan   --23 y.o. [id number] at [redacted]w[redacted]d  --Reactive tracing, not laboring --CHTN, not on meds. PEC signs/symptoms reviewed --Discharge home in stable condition, return for worsening acuity  F/U: --OB Provider PRN.  --Pt verbalizes she would like to transfer to a practice that delivers at [facility]. Given list of [facility] Providers.  Darlina Rumpf, CNM 08/17/2018, 10:18 AM

## 2018-08-17 NOTE — MAU Note (Signed)
Margaret Ingram is a 23 y.o. at 105w3d here in MAU reporting: states she has been having upper abdominal pain, last night she had her fiance check her vaginal area and states that he said it was bulging and swollen. Pain got worse around 4am but now it is gone. No bleeding or LOF, +FM. States she is getting care with [facility] and she had an appointment recently. States she told them her mucus plug came out and they checked her cervix and said she was dilated but is unsure how much she is dilated.  Onset of complaint: ongoing  Pain score: 0/10  Vitals:   08/17/18 0921  BP: (!) 153/86  Pulse: 92  Resp: 18  Temp: 98.4 F (36.9 C)  SpO2: 100%      Lab orders placed from triage: UA

## 2018-08-19 LAB — GC/CHLAMYDIA PROBE AMP (~~LOC~~) NOT AT ARMC
Chlamydia: NEGATIVE
Neisseria Gonorrhea: NEGATIVE

## 2018-08-22 ENCOUNTER — Encounter (HOSPITAL_COMMUNITY): Payer: Self-pay | Admitting: *Deleted

## 2018-08-22 ENCOUNTER — Inpatient Hospital Stay (HOSPITAL_COMMUNITY)
Admission: AD | Admit: 2018-08-22 | Discharge: 2018-08-22 | Disposition: A | Discharge status: Home / Self Care | Payer: Medicaid Other | Attending: Family Medicine | Admitting: Family Medicine

## 2018-08-22 ENCOUNTER — Other Ambulatory Visit: Payer: Self-pay

## 2018-08-22 DIAGNOSIS — Z3A32 32 weeks gestation of pregnancy: Secondary | ICD-10-CM | POA: Diagnosis not present

## 2018-08-22 DIAGNOSIS — F17210 Nicotine dependence, cigarettes, uncomplicated: Secondary | ICD-10-CM | POA: Diagnosis not present

## 2018-08-22 DIAGNOSIS — R102 Pelvic and perineal pain: Secondary | ICD-10-CM | POA: Diagnosis present

## 2018-08-22 DIAGNOSIS — O4703 False labor before 37 completed weeks of gestation, third trimester: Secondary | ICD-10-CM | POA: Diagnosis not present

## 2018-08-22 DIAGNOSIS — O26893 Other specified pregnancy related conditions, third trimester: Secondary | ICD-10-CM | POA: Insufficient documentation

## 2018-08-22 DIAGNOSIS — O99333 Smoking (tobacco) complicating pregnancy, third trimester: Secondary | ICD-10-CM | POA: Insufficient documentation

## 2018-08-22 DIAGNOSIS — K6289 Other specified diseases of anus and rectum: Secondary | ICD-10-CM | POA: Diagnosis not present

## 2018-08-22 HISTORY — DX: Depression, unspecified: F32.A

## 2018-08-22 HISTORY — DX: Unspecified convulsions: R56.9

## 2018-08-22 HISTORY — DX: Unspecified infectious disease: B99.9

## 2018-08-22 HISTORY — DX: Benign neoplasm of connective and other soft tissue, unspecified: D21.9

## 2018-08-22 LAB — WET PREP, GENITAL
Clue Cells Wet Prep HPF POC: NONE SEEN
Trich, Wet Prep: NONE SEEN
Yeast Wet Prep HPF POC: NONE SEEN

## 2018-08-22 LAB — URINALYSIS, ROUTINE W REFLEX MICROSCOPIC
Bilirubin Urine: NEGATIVE
Glucose, UA: NEGATIVE mg/dL
Hgb urine dipstick: NEGATIVE
Ketones, ur: 5 mg/dL — AB
Leukocytes,Ua: NEGATIVE
Nitrite: NEGATIVE
Protein, ur: NEGATIVE mg/dL
Specific Gravity, Urine: 1.025 (ref 1.005–1.030)
pH: 5 (ref 5.0–8.0)

## 2018-08-22 MED ORDER — ACETAMINOPHEN 500 MG PO TABS
1000.0000 mg | ORAL_TABLET | Freq: Once | ORAL | Status: AC
  Administered 2018-08-22: 1000 mg via ORAL
  Filled 2018-08-22: qty 2

## 2018-08-22 MED ORDER — LACTATED RINGERS IV BOLUS
1000.0000 mL | Freq: Once | INTRAVENOUS | Status: AC
  Administered 2018-08-22: 1000 mL via INTRAVENOUS

## 2018-08-22 MED ORDER — NIFEDIPINE 10 MG PO CAPS
10.0000 mg | ORAL_CAPSULE | ORAL | Status: DC | PRN
  Administered 2018-08-22 (×3): 10 mg via ORAL
  Filled 2018-08-22 (×3): qty 1

## 2018-08-22 NOTE — MAU Note (Signed)
Patient states she needed to leave for a family emergency.  Discharge order was in. Printed AVS  Given to patient and patient signed printed copy.  RN escorted patient to lobby.

## 2018-08-22 NOTE — MAU Note (Signed)
Was 1 cm when here the other day, was not given any meds for PTL.  The pain started last night, sharp pain, started in her rectum them to her vagina, then her abd. Hurts all over now. Feels like a bad menstrual cramp. Pain got so bad, she started getting a nose bleed. Scared her and her dad, so she came in.

## 2018-08-22 NOTE — Discharge Instructions (Signed)

## 2018-08-22 NOTE — MAU Provider Note (Signed)
History     CSN: [id number]  Arrival date and time: 08/22/18 1127   First Provider Initiated Contact with Patient 08/22/18 1228      No chief complaint on file.  Margaret Ingram is a 23 y.o. G3P2002 at [redacted]w[redacted]d who receives care at [facility] in [place] with Dr. Posey Pronto.  She presents today for vaginal pain that radiates to her rectum.  She states the pain feels like menstrual cramps and she rates them at 8-9/10.  She reports vaginal discharge for the past week that is "yellowish and slimy."  She states that she has not taken anything for her pain and has not eaten since yesterday around 11pm.  Patient denies sexual activity in the past 3 days. Patient also states that she has not taken any of her prescribed medications, but took her iron pill, prenatal, and baby aspirin yesterday.       OB History    Gravida  3   Para  2   Term  2   Preterm      AB      Living  2     SAB      TAB      Ectopic      Multiple      Live Births  2           Past Medical History:  Diagnosis Date  . Anxiety   . Depression    with pregnancies  . Diabetes mellitus without complication ([place])    pre diabetic  . Fibroid   . Heart murmur   . Hypertension    chronic  . Infection    UTI  . Pregnancy induced hypertension   . Preterm contractions 06/12/2017  . Seizures ([place])    anxiety    Past Surgical History:  Procedure Laterality Date  . NO PAST SURGERIES    . WISDOM TOOTH EXTRACTION      Family History  Problem Relation Age of Onset  . Diabetes Other   . Healthy Mother   . Healthy Father     Social History   Tobacco Use  . Smoking status: Current Some Day Smoker    Types: Cigarettes  . Smokeless tobacco: Never Used  . Tobacco comment: occ  Substance Use Topics  . Alcohol use: Not Currently    Comment: occasionally   . Drug use: Not Currently    Types: Marijuana    Comment: "not since I got pregnant"    Allergies:  Allergies  Allergen Reactions   . Azithromycin Nausea And Vomiting    No medications prior to admission.    Review of Systems  Constitutional: Negative for chills and fever.  Eyes: Negative for visual disturbance.  Respiratory: Negative for cough and shortness of breath.   Gastrointestinal: Positive for abdominal pain and nausea. Negative for constipation, diarrhea and vomiting.  Genitourinary: Positive for vaginal discharge. Negative for difficulty urinating, dysuria and vaginal bleeding.  Neurological: Positive for headaches (3/10- Has had for 2 weeks.). Negative for dizziness and light-headedness.   Physical Exam   Blood pressure 138/83, pulse 99, temperature 98.2 F (36.8 C), temperature source Oral, resp. rate 20, weight 111.3 kg, last menstrual period 01/01/2018, SpO2 100 %, currently breastfeeding.  Physical Exam  Constitutional: She is oriented to person, place, and time. She appears well-developed and well-nourished.  HENT:  Head: Normocephalic and atraumatic.  Eyes: Conjunctivae are normal.  Neck: Normal range of motion.  Cardiovascular: Normal rate.  Respiratory: Effort normal.  GI: Soft.  Genitourinary: Cervix exhibits no motion tenderness and no discharge.    No vaginal discharge or bleeding.  No bleeding in the vagina.    Genitourinary Comments: Speculum Exam: -Vaginal Vault: Pink mucosa.  Small amt thin cloudy discharge in vault -wet prep collected -Cervix:Pink, no lesions or polyps. Cystic appearance on anterior aspect. Appears closed. No active bleeding from os -Bimanual Exam: FT-1cm/Long/Thick   Musculoskeletal: Normal range of motion.  Neurological: She is alert and oriented to person, place, and time.  Skin: Skin is warm and dry.  Psychiatric: She has a normal mood and affect. Her behavior is normal.    Fetal Assessment 125 bpm, Mod Var, -Decels, +Accels Toco: Q1-25min, palpates mild  MAU Course   Results for orders placed or performed during the hospital encounter of 08/22/18  (from the past 24 hour(s))  Wet prep, genital     Status: Abnormal   Collection Time: 08/22/18 12:45 PM   Specimen: Cervix  Result Value Ref Range   Yeast Wet Prep HPF POC NONE SEEN NONE SEEN   Trich, Wet Prep NONE SEEN NONE SEEN   Clue Cells Wet Prep HPF POC NONE SEEN NONE SEEN   WBC, Wet Prep HPF POC MANY (A) NONE SEEN   Sperm PRESENT   Urinalysis, Routine w reflex microscopic     Status: Abnormal   Collection Time: 08/22/18  2:00 PM  Result Value Ref Range   Color, Urine YELLOW YELLOW   APPearance CLEAR CLEAR   Specific Gravity, Urine 1.025 1.005 - 1.030   pH 5.0 5.0 - 8.0   Glucose, UA NEGATIVE NEGATIVE mg/dL   Hgb urine dipstick NEGATIVE NEGATIVE   Bilirubin Urine NEGATIVE NEGATIVE   Ketones, ur 5 (A) NEGATIVE mg/dL   Protein, ur NEGATIVE NEGATIVE mg/dL   Nitrite NEGATIVE NEGATIVE   Leukocytes,Ua NEGATIVE NEGATIVE   No results found.  MDM PE Labs:Wet prep, UA EFM Pain Medication Procardia Protocol Assessment and Plan  23 year old G3P2002  SIUP at 32.1 weeks Cat I FT Vaginal/Rectal Pain Contractions  -Exam findings discussed. -Labs collected. -Will give fluid bolus -Procardia protocol -Tylenol XR for pain -Will continue to monitor and reassess -NST Reactive  Follow Up (1:53 PM)  -Wet prep returns with insignificant findings. -Results discussed with patient. -Patient s/p 3 doses of procardia -Patient reports some improvement in vaginal symptoms from 8 to 7/10, but goes on to say contractions have resolved. *Reflective on tocometry -Patient questions about history of preeclampsia and if symptoms today c/w this. -Informed that she was not evaluated for preeclampsia as she did not present with these symptoms. -Instructed to keep next appt as scheduled. -Patient expresses a desire to transfer to [facility] practice and states she has been "calling around." -No other questions or concerns. -Will discharge to home -Instructed to rest, hydrate, and monitor symptoms  for worsening.  Maryann Conners MSN, CNM 08/22/2018, 12:28 PM

## 2018-09-19 ENCOUNTER — Encounter (HOSPITAL_COMMUNITY): Payer: Self-pay | Admitting: Emergency Medicine

## 2018-09-19 ENCOUNTER — Other Ambulatory Visit: Payer: Self-pay

## 2018-09-19 DIAGNOSIS — E119 Type 2 diabetes mellitus without complications: Secondary | ICD-10-CM | POA: Diagnosis not present

## 2018-09-19 DIAGNOSIS — F17210 Nicotine dependence, cigarettes, uncomplicated: Secondary | ICD-10-CM | POA: Insufficient documentation

## 2018-09-19 DIAGNOSIS — R509 Fever, unspecified: Secondary | ICD-10-CM | POA: Diagnosis present

## 2018-09-19 DIAGNOSIS — I10 Essential (primary) hypertension: Secondary | ICD-10-CM | POA: Diagnosis not present

## 2018-09-19 DIAGNOSIS — N10 Acute tubulo-interstitial nephritis: Secondary | ICD-10-CM | POA: Diagnosis not present

## 2018-09-19 MED ORDER — SODIUM CHLORIDE 0.9% FLUSH: 3.0000 mL | Freq: Once | INTRAVENOUS | Status: DC

## 2018-09-19 NOTE — ED Triage Notes (Signed)
Pt c/o generalized body ache, fever and chills since last night took 800 mg Ibuprofen pta to the ED. Pt states she just had a baby on this hospital 2 weeks ago and she thinks she may got the COVID-19 on the hospital.

## 2018-09-20 ENCOUNTER — Emergency Department (HOSPITAL_COMMUNITY)
Admission: EM | Admit: 2018-09-20 | Discharge: 2018-09-20 | Disposition: A | Discharge status: Home / Self Care | Payer: Medicaid Other | Attending: Emergency Medicine | Admitting: Emergency Medicine

## 2018-09-20 DIAGNOSIS — N12 Tubulo-interstitial nephritis, not specified as acute or chronic: Secondary | ICD-10-CM

## 2018-09-20 LAB — I-STAT BETA HCG BLOOD, ED (MC, WL, AP ONLY): I-stat hCG, quantitative: <5 m[IU]/mL (ref <5)

## 2018-09-20 LAB — CBC WITH DIFFERENTIAL/PLATELET
Abs Immature Granulocytes: 0.03 10*3/uL (ref 0.00–0.07)
Basophils Absolute: 0 10*3/uL (ref 0.0–0.1)
Basophils Relative: 0 %
Eosinophils Absolute: 0 10*3/uL (ref 0.0–0.5)
Eosinophils Relative: 0 %
HCT: 39 % (ref 36.0–46.0)
Hemoglobin: 12.6 g/dL (ref 12.0–15.0)
Immature Granulocytes: 0 %
Lymphocytes Relative: 15 %
Lymphs Abs: 1.4 10*3/uL (ref 0.7–4.0)
MCH: 27.4 pg (ref 26.0–34.0)
MCHC: 32.3 g/dL (ref 30.0–36.0)
MCV: 84.8 fL (ref 80.0–100.0)
Monocytes Absolute: 0.8 10*3/uL (ref 0.1–1.0)
Monocytes Relative: 9 %
Neutro Abs: 7 10*3/uL (ref 1.7–7.7)
Neutrophils Relative %: 76 %
Platelets: 169 10*3/uL (ref 150–400)
RBC: 4.6 MIL/uL (ref 3.87–5.11)
RDW: 12.5 % (ref 11.5–15.5)
WBC: 9.3 10*3/uL (ref 4.0–10.5)
nRBC: 0 % (ref 0.0–0.2)

## 2018-09-20 LAB — URINALYSIS, ROUTINE W REFLEX MICROSCOPIC
Bilirubin Urine: NEGATIVE
Glucose, UA: NEGATIVE mg/dL
Ketones, ur: NEGATIVE mg/dL
Nitrite: POSITIVE — AB
Protein, ur: >=300 mg/dL — AB
RBC / HPF: >50 RBC/hpf — ABNORMAL HIGH (ref 0–5)
Specific Gravity, Urine: 1.02 (ref 1.005–1.030)
WBC, UA: >50 WBC/hpf — ABNORMAL HIGH (ref 0–5)
pH: 5 (ref 5.0–8.0)

## 2018-09-20 LAB — COMPREHENSIVE METABOLIC PANEL
ALT: 16 U/L (ref 0–44)
AST: 15 U/L (ref 15–41)
Albumin: 3.6 g/dL (ref 3.5–5.0)
Alkaline Phosphatase: 98 U/L (ref 38–126)
Anion gap: 13 (ref 5–15)
BUN: 12 mg/dL (ref 6–20)
CO2: 20 mmol/L — ABNORMAL LOW (ref 22–32)
Calcium: 8.7 mg/dL — ABNORMAL LOW (ref 8.9–10.3)
Chloride: 103 mmol/L (ref 98–111)
Creatinine, Ser: 0.99 mg/dL (ref 0.44–1.00)
GFR calc Af Amer: >60 mL/min (ref >60)
GFR calc non Af Amer: >60 mL/min (ref >60)
Glucose, Bld: 106 mg/dL — ABNORMAL HIGH (ref 70–99)
Potassium: 3 mmol/L — ABNORMAL LOW (ref 3.5–5.1)
Sodium: 136 mmol/L (ref 135–145)
Total Bilirubin: 0.7 mg/dL (ref 0.3–1.2)
Total Protein: 7.6 g/dL (ref 6.5–8.1)

## 2018-09-20 LAB — LACTIC ACID, PLASMA: Lactic Acid, Venous: 0.7 mmol/L (ref 0.5–1.9)

## 2018-09-20 MED ORDER — CEPHALEXIN 500 MG PO CAPS: 500.0000 mg | ORAL_CAPSULE | Freq: Four times a day (QID) | ORAL | 0 refills | Status: DC

## 2018-09-20 MED ORDER — CEPHALEXIN 250 MG PO CAPS
500.0000 mg | ORAL_CAPSULE | Freq: Once | ORAL | Status: AC
  Administered 2018-09-20: 500 mg via ORAL
  Filled 2018-09-20: qty 2

## 2018-09-20 NOTE — ED Provider Notes (Signed)
[facility] EMERGENCY DEPARTMENT Provider Note   CSN: [id number] Arrival date & time: 09/19/18  2350     History   Chief Complaint Chief Complaint  Patient presents with  . Fever    HPI Margaret Ingram is a 23 y.o. female.     The history is provided by the patient.  Fever Temp source:  Subjective Severity:  Moderate Onset quality:  Sudden Duration:  1 day Timing:  Intermittent Progression:  Waxing and waning Chronicity:  New Worsened by:  Nothing Associated symptoms: chills, headaches and myalgias   Associated symptoms: no chest pain, no cough, no diarrhea, no dysuria and no vomiting    Patient with history of anxiety, depression, hypertension presents with fever.  Patient recently underwent vaginal delivery at outside hospital.  She has been home with her baby doing well.  Over the past 24 hours the patient has had fever and chills.  She is reporting headaches and myalgias.  She has intermittent left upper quadrant pain.  No back pain.  No dysuria.  She does have urinary frequency.  No vaginal bleeding or discharge.  No lower abdominal pain. She denies cough.  No known sick contacts Past Medical History:  Diagnosis Date  . Anxiety   . Depression    with pregnancies  . Diabetes mellitus without complication ([place])    pre diabetic  . Fibroid   . Heart murmur   . Hypertension    chronic  . Infection    UTI  . Pregnancy induced hypertension   . Preterm contractions 06/12/2017  . Seizures ([place])    anxiety    Patient Active Problem List   Diagnosis Date Noted  . Hx of chlamydia infection 08/17/2018  . Hx of preeclampsia, prior pregnancy, currently pregnant, third trimester 08/17/2018  . Chronic hypertension 08/17/2018  . Depression 08/17/2018  . Vaginal bleeding in pregnancy, first trimester 03/26/2018    Past Surgical History:  Procedure Laterality Date  . NO PAST SURGERIES    . WISDOM TOOTH EXTRACTION       OB History    Gravida   3   Para  2   Term  2   Preterm      AB      Living  2     SAB      TAB      Ectopic      Multiple      Live Births  2            Home Medications    Prior to Admission medications   Medication Sig Start Date End Date Taking? Authorizing Provider  cephALEXin (KEFLEX) 500 MG capsule Take 1 capsule (500 mg total) by mouth 4 (four) times daily. 09/20/18   Ripley Fraise, MD    Family History Family History  Problem Relation Age of Onset  . Diabetes Other   . Healthy Mother   . Healthy Father     Social History Social History   Tobacco Use  . Smoking status: Current Some Day Smoker    Types: Cigarettes  . Smokeless tobacco: Never Used  . Tobacco comment: occ  Substance Use Topics  . Alcohol use: Not Currently    Comment: occasionally   . Drug use: Not Currently    Types: Marijuana    Comment: "not since I got pregnant"     Allergies   Azithromycin   Review of Systems Review of Systems  Constitutional: Positive for chills and fever.  Respiratory: Negative for cough.   Cardiovascular: Negative for chest pain.  Gastrointestinal: Negative for diarrhea and vomiting.  Genitourinary: Positive for frequency. Negative for dysuria, vaginal bleeding and vaginal discharge.  Musculoskeletal: Positive for myalgias.  Neurological: Positive for headaches.  All other systems reviewed and are negative.    Physical Exam Updated Vital Signs BP (!) 142/81   Pulse 98   Temp 98.5 F (36.9 C) (Oral)   Resp 20   Ht 1.575 m (5\' 2" )   Wt 102.1 kg   LMP 01/01/2018 (Exact Date)   SpO2 98%   BMI 41.15 kg/m   Physical Exam CONSTITUTIONAL: Well developed/well nourished, nontoxic and well-appearing HEAD: Normocephalic/atraumatic EYES: EOMI/PERRL ENMT: Mucous membranes moist NECK: supple no meningeal signs SPINE/BACK:entire spine nontender CV: S1/S2 noted, no murmurs/rubs/gallops noted LUNGS: Lungs are clear to auscultation bilaterally, no apparent  distress ABDOMEN: soft, mild LUQ tenderness, no rebound or guarding, bowel sounds noted throughout abdomen GU:no cva tenderness NEURO: Pt is awake/alert/appropriate, moves all extremitiesx4.  No facial droop.   EXTREMITIES: pulses normal/equal, full ROM SKIN: warm, color normal PSYCH: no abnormalities of mood noted, alert and oriented to situation   ED Treatments / Results  Labs (all labs ordered are listed, but only abnormal results are displayed) Labs Reviewed  COMPREHENSIVE METABOLIC PANEL - Abnormal; Notable for the following components:      Result Value   Potassium 3.0 (*)    CO2 20 (*)    Glucose, Bld 106 (*)    Calcium 8.7 (*)    All other components within normal limits  URINALYSIS, ROUTINE W REFLEX MICROSCOPIC - Abnormal; Notable for the following components:   APPearance CLOUDY (*)    Hgb urine dipstick MODERATE (*)    Protein, ur >=300 (*)    Nitrite POSITIVE (*)    Leukocytes,Ua LARGE (*)    RBC / HPF >50 (*)    WBC, UA >50 (*)    Bacteria, UA MANY (*)    Non Squamous Epithelial 0-5 (*)    All other components within normal limits  URINE CULTURE  LACTIC ACID, PLASMA  CBC WITH DIFFERENTIAL/PLATELET  I-STAT BETA HCG BLOOD, ED ([facility], [facility], [facility] ONLY)    EKG None  Radiology No results found.  Procedures Procedures  Medications Ordered in ED Medications  sodium chloride flush (NS) 0.9 % injection 3 mL (has no administration in time range)  cephALEXin (KEFLEX) capsule 500 mg (has no administration in time range)     Initial Impression / Assessment and Plan / ED Course  I have reviewed the triage vital signs and the nursing notes.  Pertinent labs  results that were available during my care of the patient were reviewed by me and considered in my medical decision making (see chart for details).        Patient found to have obvious UTI.  She is well-appearing and not septic appearing.  Repeat heart rate is in the 90s.  Blood pressure is appropriate. She  denies cough or shortness of breath.  Low suspicion for COVID-19.  Will treat for outpatient pyelonephritis.  We discussed strict ER return precautions. She is not currently breast-feeding  Final Clinical Impressions(s) / ED Diagnoses   Final diagnoses:  Pyelonephritis    ED Discharge Orders         Ordered    cephALEXin (KEFLEX) 500 MG capsule  4 times daily     09/20/18 0135           Ripley Fraise, MD 09/20/18  0154  

## 2018-09-22 LAB — URINE CULTURE: Culture: >=100000 — AB

## 2018-09-23 ENCOUNTER — Telehealth: Payer: Self-pay | Admitting: Emergency Medicine

## 2018-09-23 NOTE — Telephone Encounter (Signed)
Post ED Visit - Positive Culture Follow-up  Culture report reviewed by antimicrobial stewardship pharmacist: [place] Team []  [address] Pharm.D. []  Heide Guile, Pharm.D., BCPS AQ-ID []  Parks Neptune, Pharm.D., BCPS []  Alycia Rossetti, Pharm.D., BCPS []  [place], Pharm.D., BCPS, AAHIVP []  Legrand Como, Pharm.D., BCPS, AAHIVP []  Salome Arnt, PharmD, BCPS []  Johnnette Gourd, PharmD, BCPS []  Hughes Better, PharmD, BCPS []  Leeroy Cha, PharmD []  Laqueta Linden, PharmD, BCPS []  Albertina Parr, PharmD  [place] Team []  Leodis Sias, PharmD []  Lindell Spar, PharmD []  Royetta Asal, PharmD []  Graylin Shiver, Rph []  Rema Fendt) Glennon Mac, PharmD []  Arlyn Dunning, PharmD []  Netta Cedars, PharmD []  Dia Sitter, PharmD []  Leone Haven, PharmD []  Gretta Arab, PharmD []  Theodis Shove, PharmD []  Peggyann Juba, PharmD []  Reuel Boom, PharmD   Positive urine culture Treated with cephalexin, organism sensitive to the same and no further patient follow-up is required at this time.  Hazle Nordmann 09/23/2018, 9:58 AM

## 2019-01-02 ENCOUNTER — Encounter (HOSPITAL_COMMUNITY): Payer: Self-pay

## 2019-01-02 ENCOUNTER — Ambulatory Visit (HOSPITAL_COMMUNITY)
Admission: EM | Admit: 2019-01-02 | Discharge: 2019-01-02 | Disposition: A | Discharge status: Home / Self Care | Payer: Medicaid Other | Attending: Urgent Care | Admitting: Urgent Care

## 2019-01-02 ENCOUNTER — Other Ambulatory Visit: Payer: Self-pay

## 2019-01-02 DIAGNOSIS — R52 Pain, unspecified: Secondary | ICD-10-CM | POA: Diagnosis not present

## 2019-01-02 DIAGNOSIS — R110 Nausea: Secondary | ICD-10-CM | POA: Diagnosis not present

## 2019-01-02 DIAGNOSIS — Z8759 Personal history of other complications of pregnancy, childbirth and the puerperium: Secondary | ICD-10-CM

## 2019-01-02 DIAGNOSIS — Z3201 Encounter for pregnancy test, result positive: Secondary | ICD-10-CM

## 2019-01-02 DIAGNOSIS — R0789 Other chest pain: Secondary | ICD-10-CM | POA: Diagnosis not present

## 2019-01-02 DIAGNOSIS — M791 Myalgia, unspecified site: Secondary | ICD-10-CM | POA: Diagnosis not present

## 2019-01-02 LAB — POCT URINALYSIS DIP (DEVICE)
Bilirubin Urine: NEGATIVE
Glucose, UA: NEGATIVE mg/dL
Hgb urine dipstick: NEGATIVE
Ketones, ur: NEGATIVE mg/dL
Nitrite: NEGATIVE
Protein, ur: NEGATIVE mg/dL
Specific Gravity, Urine: 1.025 (ref 1.005–1.030)
Urobilinogen, UA: 0.2 mg/dL (ref 0.0–1.0)
pH: 6 (ref 5.0–8.0)

## 2019-01-02 LAB — POCT PREGNANCY, URINE: Preg Test, Ur: POSITIVE — AB

## 2019-01-02 MED ORDER — VITAMIN B-6 25 MG PO TABS: 25.0000 mg | ORAL_TABLET | Freq: Three times a day (TID) | ORAL | 0 refills | Status: DC

## 2019-01-02 NOTE — ED Triage Notes (Signed)
Pt presents with upper central chest pain, nausea,headache, and generalized body aches. Pt also states she elevated blood pressure last night and may also be pregnant.

## 2019-01-02 NOTE — ED Provider Notes (Signed)
[place]   MRN: [id number] DOB: January 23, 1996  Subjective:   Margaret Ingram is a 23 y.o. female presenting for 4 day history of chest pain, body aches, nausea, headaches, general moderate malaise. Tried otc children's medicine with temporary relief. Patient had a baby 08/28/2018.  Patient is very worried about pregnancy, states that she had a history of preeclampsia and major complications for her baby.  States that the current symptoms she has now are very similar to what she had when she was pregnant.  She does not have an obstetrician.  Denies any COVID-19 contacts.  Denies taking chronic medications.    Allergies  Allergen Reactions  . Azithromycin Nausea And Vomiting    Past Medical History:  Diagnosis Date  . Anxiety   . Depression    with pregnancies  . Diabetes mellitus without complication ([place])    pre diabetic  . Fibroid   . Heart murmur   . Hypertension    chronic  . Infection    UTI  . Pregnancy induced hypertension   . Preterm contractions 06/12/2017  . Seizures ([place])    anxiety     Past Surgical History:  Procedure Laterality Date  . NO PAST SURGERIES    . WISDOM TOOTH EXTRACTION      Family History  Problem Relation Age of Onset  . Diabetes Other   . Healthy Mother   . Healthy Father     Social History   Tobacco Use  . Smoking status: Current Some Day Smoker    Types: Cigarettes  . Smokeless tobacco: Never Used  . Tobacco comment: occ  Substance Use Topics  . Alcohol use: Not Currently    Comment: occasionally   . Drug use: Not Currently    Types: Marijuana    Comment: "not since I got pregnant"    Review of Systems  Constitutional: Positive for malaise/fatigue. Negative for fever.  HENT: Negative for congestion, ear pain, sinus pain and sore throat.   Eyes: Negative for discharge and redness.  Respiratory: Negative for cough, hemoptysis, shortness of breath and wheezing.   Cardiovascular: Positive for chest pain.   Gastrointestinal: Positive for nausea. Negative for abdominal pain, diarrhea and vomiting.  Genitourinary: Negative for dysuria, flank pain and hematuria.  Musculoskeletal: Positive for myalgias.  Skin: Negative for rash.  Neurological: Positive for headaches. Negative for dizziness and weakness.  Psychiatric/Behavioral: Negative for depression and substance abuse.     Objective:   Vitals: BP 135/89 (BP Location: Right Arm)   Pulse 81   Temp 98.2 F (36.8 C) (Oral)   Resp 17   LMP 12/05/2017   SpO2 100%   Breastfeeding Unknown   BP Readings from Last 3 Encounters:  01/02/19 135/89  09/20/18 (!) 142/81  08/22/18 (!) 123/50   Physical Exam Constitutional:      General: She is not in acute distress.    Appearance: Normal appearance. She is well-developed. She is not ill-appearing, toxic-appearing or diaphoretic.  HENT:     Head: Normocephalic and atraumatic.     Nose: Nose normal.     Mouth/Throat:     Mouth: Mucous membranes are moist.     Pharynx: Oropharynx is clear.  Eyes:     General: No scleral icterus.    Extraocular Movements: Extraocular movements intact.     Pupils: Pupils are equal, round, and reactive to light.  Cardiovascular:     Rate and Rhythm: Normal rate and regular rhythm.     Pulses: Normal pulses.  Heart sounds: Normal heart sounds. No murmur. No friction rub. No gallop.   Pulmonary:     Effort: Pulmonary effort is normal. No respiratory distress.     Breath sounds: Normal breath sounds. No stridor. No wheezing, rhonchi or rales.  Abdominal:     General: Bowel sounds are normal. There is no distension.     Palpations: Abdomen is soft. There is no mass.     Tenderness: There is no abdominal tenderness. There is no right CVA tenderness, left CVA tenderness or guarding.  Skin:    General: Skin is warm and dry.     Findings: No rash.  Neurological:     General: No focal deficit present.     Mental Status: She is alert and oriented to person,  place, and time.     Cranial Nerves: No cranial nerve deficit.     Motor: No weakness.     Coordination: Coordination normal.     Gait: Gait normal.     Deep Tendon Reflexes: Reflexes normal.  Psychiatric:        Mood and Affect: Mood is anxious. Affect is tearful (with pregnancy test results).        Behavior: Behavior normal.        Thought Content: Thought content normal.        Judgment: Judgment normal.     Results for orders placed or performed during the hospital encounter of 01/02/19 (from the past 24 hour(s))  POCT urinalysis dip (device)     Status: Abnormal   Collection Time: 01/02/19 10:27 AM  Result Value Ref Range   Glucose, UA NEGATIVE NEGATIVE mg/dL   Bilirubin Urine NEGATIVE NEGATIVE   Ketones, ur NEGATIVE NEGATIVE mg/dL   Specific Gravity, Urine 1.025 1.005 - 1.030   Hgb urine dipstick NEGATIVE NEGATIVE   pH 6.0 5.0 - 8.0   Protein, ur NEGATIVE NEGATIVE mg/dL   Urobilinogen, UA 0.2 0.0 - 1.0 mg/dL   Nitrite NEGATIVE NEGATIVE   Leukocytes,Ua SMALL (A) NEGATIVE  Pregnancy, urine POC     Status: Abnormal   Collection Time: 01/02/19 10:40 AM  Result Value Ref Range   Preg Test, Ur POSITIVE (A) NEGATIVE    Assessment and Plan :   1. Positive pregnancy test   2. Body aches   3. Nausea without vomiting   4. Atypical chest pain   5. History of pre-eclampsia     Suspect chest pain is related to possible acid reflux for persistent nausea in pregnancy.  Patient was very upset about being pregnant again, I tried to reassure her and recommend that she contact women's health today to establish care.  Recommended patient hold off on drinking any alcohol, smoking cigarettes.  Advised that she pick up prenatal vitamin, start pyridoxine for nausea in pregnancy.  Use Tylenol for aches and pains. Counseled patient on potential for adverse effects with medications prescribed/recommended today, ER and return-to-clinic precautions discussed, patient verbalized understanding.     Jaynee Eagles, PA-C 01/02/19 1056

## 2019-01-03 LAB — URINE CULTURE: Culture: <10000 — AB

## 2019-03-29 IMAGING — US US OB TRANSVAGINAL
1 series · 12 of 12 positions shown · non-contrast
Comparison: Pelvic ultrasound 01/30/2018

CLINICAL DATA: Vaginal bleeding.  Positive pregnancy test.

EXAM:
TRANSVAGINAL OB ULTRASOUND
TECHNIQUE: Transvaginal ultrasound was performed for complete evaluation of the
gestation as well as the maternal uterus, adnexal regions, and
pelvic cul-de-sac.

[Series 1: us ob transvaginal · 12 acquisitions, 12 frames shown]
[im 1/12]
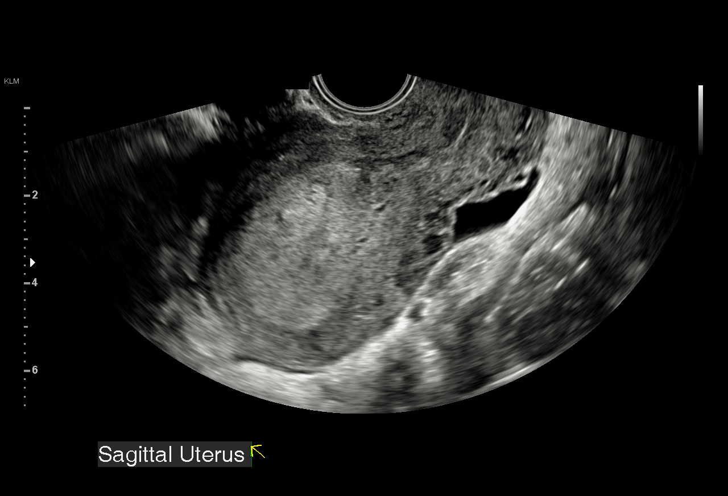
[im 2/12]
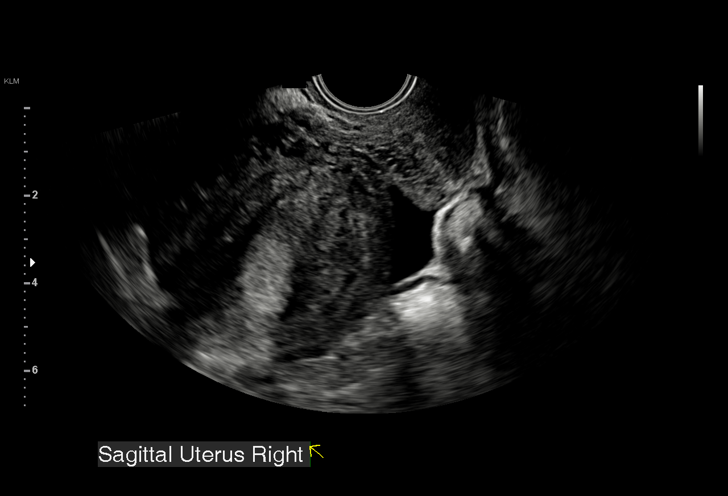
[im 3/12]
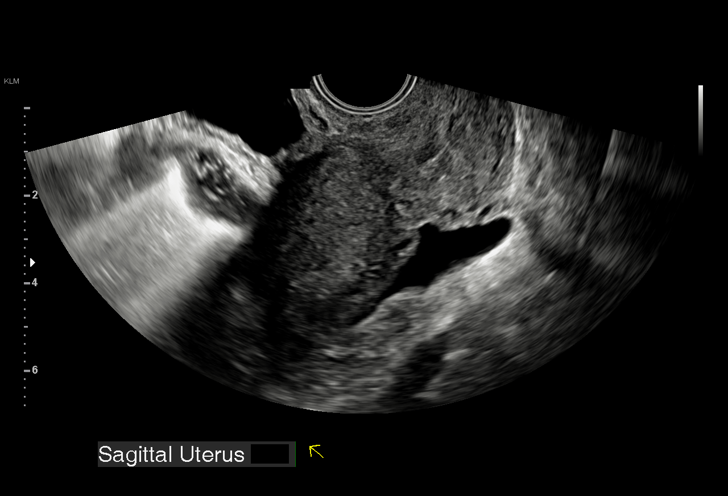
[im 4/12]
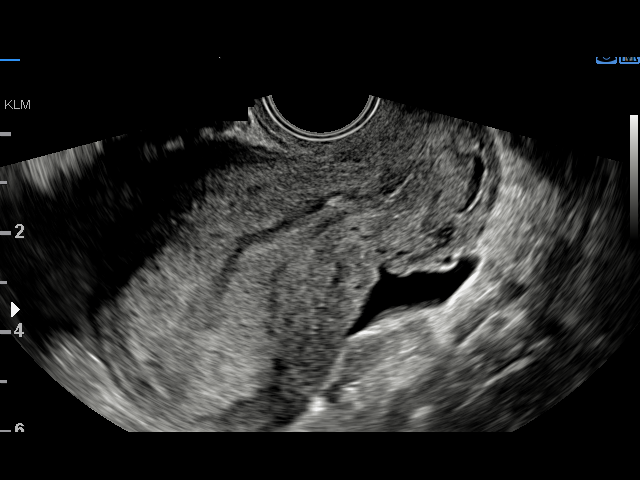
[im 5/12]
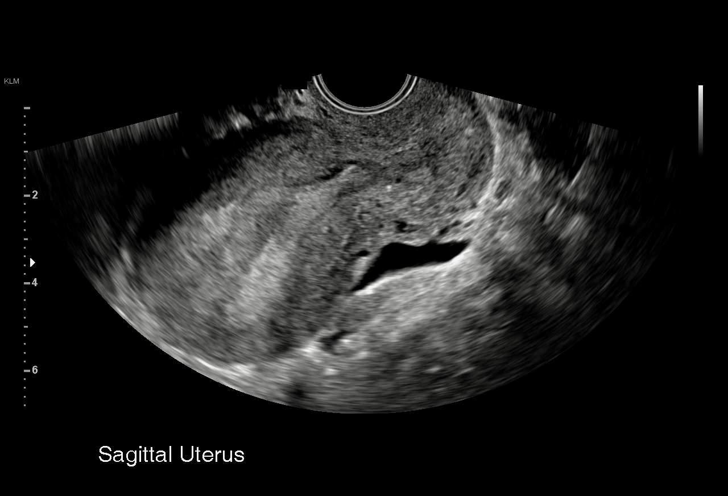
[im 6/12]
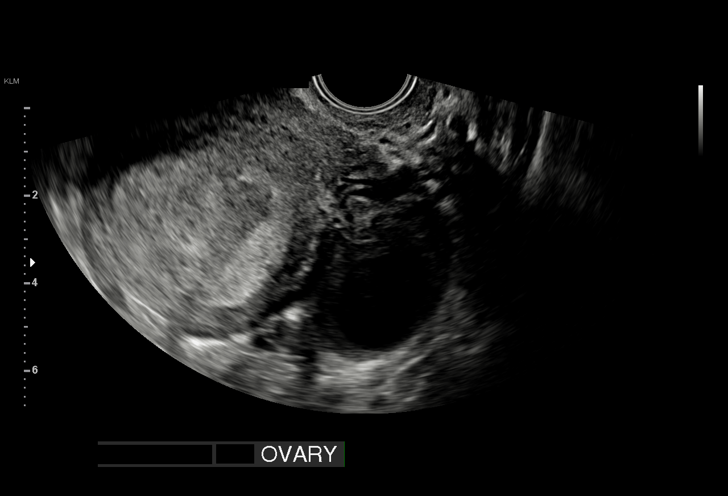
[im 7/12]
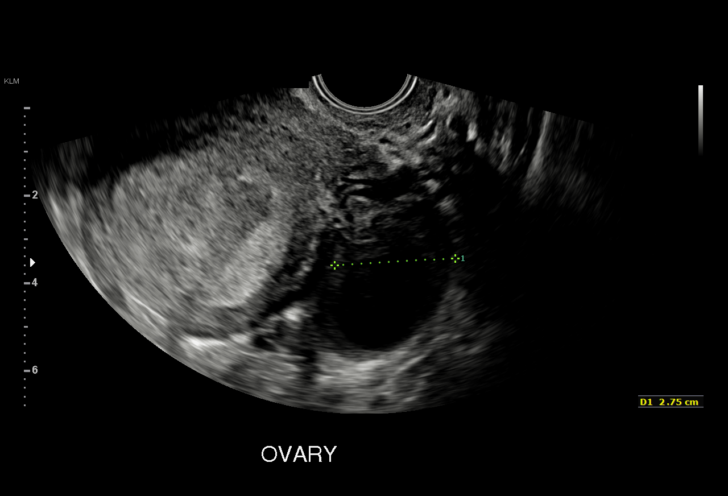
[im 8/12]
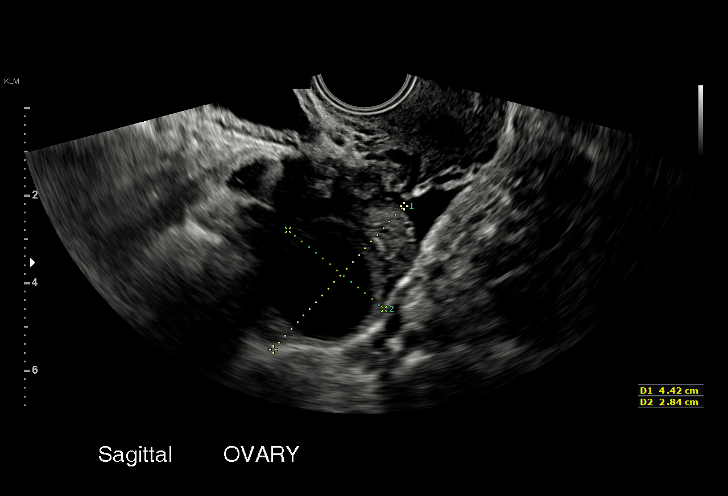
[im 9/12]
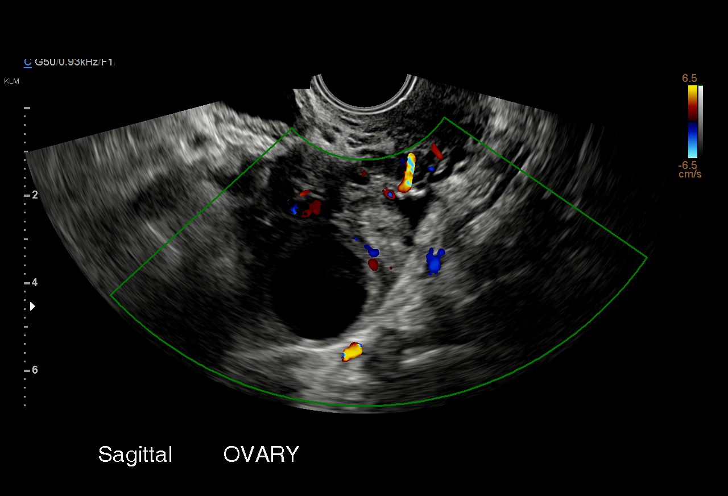
[im 10/12]
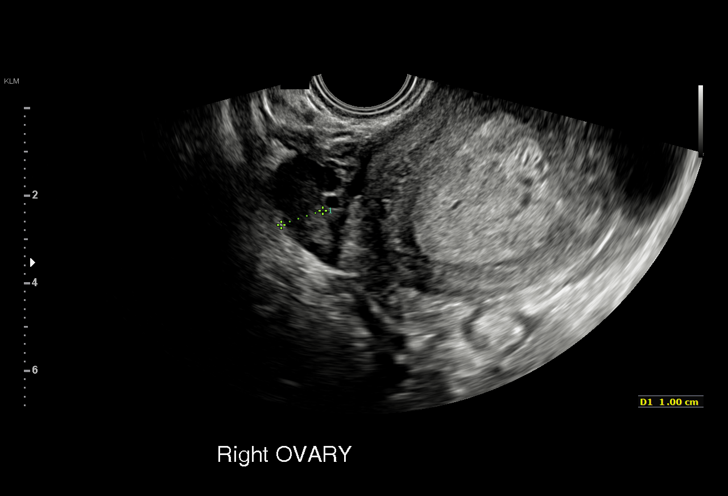
[im 11/12]
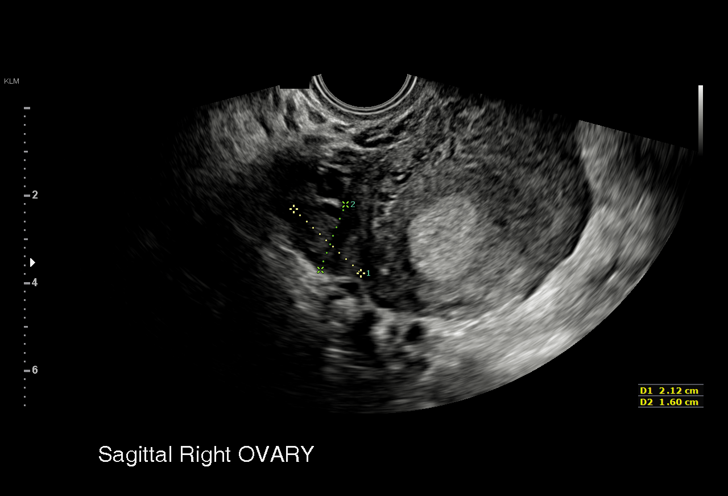
[im 12/12]
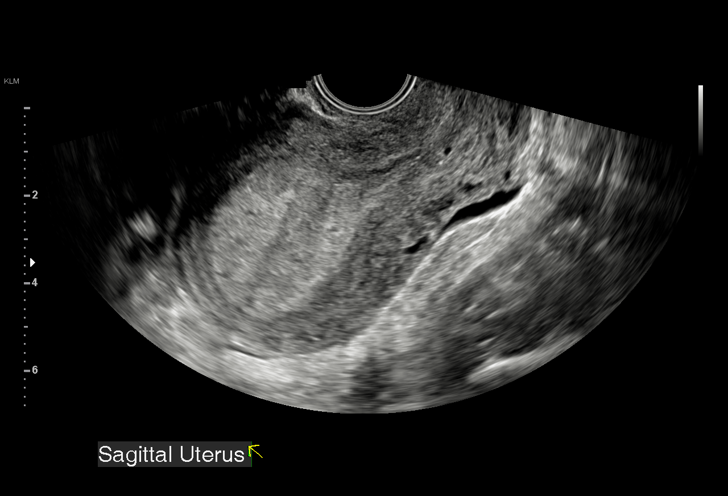

[12 of 12 positions shown; findings below may reference images not displayed]

FINDINGS: Intrauterine gestational sac: None

Yolk sac:  Not Visualized.

Embryo:  Not Visualized.

Cardiac Activity: Not Visualized.

Maternal uterus/adnexae: Mobile echogenic debris within the
endometrial cavity suggestive of blood products. Left ovarian cyst.
Normal appearance of the right ovary. Small amount of free fluid in
the pelvis.
IMPRESSION: Mobile echogenic material within the endometrial cavity suggestive
of blood products.

No intrauterine gestation identified. In the setting of positive
pregnancy test and no definite intrauterine pregnancy, this reflects
a pregnancy of unknown location. Differential considerations include
early normal IUP, abnormal IUP, or nonvisualized ectopic pregnancy.
Differentiation is achieved with serial beta HCG supplemented by
repeat sonography as clinically warranted.

## 2019-06-20 ENCOUNTER — Encounter: Payer: Self-pay | Admitting: Student

## 2019-06-20 ENCOUNTER — Encounter (HOSPITAL_COMMUNITY): Payer: Self-pay | Admitting: *Deleted

## 2019-06-20 ENCOUNTER — Inpatient Hospital Stay (HOSPITAL_COMMUNITY)
Admission: AD | Admit: 2019-06-20 | Discharge: 2019-06-20 | Discharge status: Against Medical Advice | Payer: Medicaid Other | Attending: Obstetrics and Gynecology | Admitting: Obstetrics and Gynecology

## 2019-06-20 DIAGNOSIS — R102 Pelvic and perineal pain: Secondary | ICD-10-CM | POA: Insufficient documentation

## 2019-06-20 DIAGNOSIS — O99333 Smoking (tobacco) complicating pregnancy, third trimester: Secondary | ICD-10-CM | POA: Insufficient documentation

## 2019-06-20 DIAGNOSIS — Z3A28 28 weeks gestation of pregnancy: Secondary | ICD-10-CM | POA: Insufficient documentation

## 2019-06-20 DIAGNOSIS — E119 Type 2 diabetes mellitus without complications: Secondary | ICD-10-CM | POA: Insufficient documentation

## 2019-06-20 DIAGNOSIS — R109 Unspecified abdominal pain: Secondary | ICD-10-CM | POA: Diagnosis not present

## 2019-06-20 DIAGNOSIS — Z7982 Long term (current) use of aspirin: Secondary | ICD-10-CM | POA: Insufficient documentation

## 2019-06-20 DIAGNOSIS — Z833 Family history of diabetes mellitus: Secondary | ICD-10-CM | POA: Insufficient documentation

## 2019-06-20 DIAGNOSIS — O24913 Unspecified diabetes mellitus in pregnancy, third trimester: Secondary | ICD-10-CM | POA: Insufficient documentation

## 2019-06-20 DIAGNOSIS — Z881 Allergy status to other antibiotic agents status: Secondary | ICD-10-CM | POA: Diagnosis not present

## 2019-06-20 DIAGNOSIS — O10913 Unspecified pre-existing hypertension complicating pregnancy, third trimester: Secondary | ICD-10-CM

## 2019-06-20 DIAGNOSIS — O26893 Other specified pregnancy related conditions, third trimester: Secondary | ICD-10-CM

## 2019-06-20 DIAGNOSIS — F17210 Nicotine dependence, cigarettes, uncomplicated: Secondary | ICD-10-CM | POA: Insufficient documentation

## 2019-06-20 DIAGNOSIS — Z79899 Other long term (current) drug therapy: Secondary | ICD-10-CM | POA: Diagnosis not present

## 2019-06-20 DIAGNOSIS — O368130 Decreased fetal movements, third trimester, not applicable or unspecified: Secondary | ICD-10-CM | POA: Insufficient documentation

## 2019-06-20 LAB — COMPREHENSIVE METABOLIC PANEL
ALT: 13 U/L (ref 0–44)
AST: 15 U/L (ref 15–41)
Albumin: 3.2 g/dL — ABNORMAL LOW (ref 3.5–5.0)
Alkaline Phosphatase: 64 U/L (ref 38–126)
Anion gap: 11 (ref 5–15)
BUN: 6 mg/dL (ref 6–20)
CO2: 20 mmol/L — ABNORMAL LOW (ref 22–32)
Calcium: 8.9 mg/dL (ref 8.9–10.3)
Chloride: 105 mmol/L (ref 98–111)
Creatinine, Ser: 0.47 mg/dL (ref 0.44–1.00)
GFR calc Af Amer: >60 mL/min (ref >60)
GFR calc non Af Amer: >60 mL/min (ref >60)
Glucose, Bld: 89 mg/dL (ref 70–99)
Potassium: 4 mmol/L (ref 3.5–5.1)
Sodium: 136 mmol/L (ref 135–145)
Total Bilirubin: 0.5 mg/dL (ref 0.3–1.2)
Total Protein: 6.8 g/dL (ref 6.5–8.1)

## 2019-06-20 LAB — CBC
HCT: 36.4 % (ref 36.0–46.0)
Hemoglobin: 11.6 g/dL — ABNORMAL LOW (ref 12.0–15.0)
MCH: 29.1 pg (ref 26.0–34.0)
MCHC: 31.9 g/dL (ref 30.0–36.0)
MCV: 91.5 fL (ref 80.0–100.0)
Platelets: 138 10*3/uL — ABNORMAL LOW (ref 150–400)
RBC: 3.98 MIL/uL (ref 3.87–5.11)
RDW: 12 % (ref 11.5–15.5)
WBC: 7.2 10*3/uL (ref 4.0–10.5)
nRBC: 0 % (ref 0.0–0.2)

## 2019-06-20 LAB — URINALYSIS, ROUTINE W REFLEX MICROSCOPIC
Bacteria, UA: NONE SEEN
Bilirubin Urine: NEGATIVE
Glucose, UA: NEGATIVE mg/dL
Hgb urine dipstick: NEGATIVE
Ketones, ur: NEGATIVE mg/dL
Nitrite: NEGATIVE
Protein, ur: NEGATIVE mg/dL
Specific Gravity, Urine: 1.016 (ref 1.005–1.030)
pH: 7 (ref 5.0–8.0)

## 2019-06-20 LAB — PROTEIN / CREATININE RATIO, URINE
Creatinine, Urine: 89.87 mg/dL
Protein Creatinine Ratio: 0.08 mg/mg{Cre} (ref 0.00–0.15)
Total Protein, Urine: 7 mg/dL

## 2019-06-20 MED ORDER — SODIUM CHLORIDE FLUSH 0.9 % IV SOLN
10.00 | INTRAVENOUS | Status: DC
Start: ? — End: 2019-06-20

## 2019-06-20 MED ORDER — FERROUS SULFATE 325 (65 FE) MG PO TABS
325.00 | ORAL_TABLET | ORAL | Status: DC
Start: 2019-06-20 — End: 2019-06-20

## 2019-06-20 MED ORDER — LABETALOL HCL 100 MG PO TABS
200.0000 mg | ORAL_TABLET | Freq: Once | ORAL | Status: AC
  Administered 2019-06-20: 200 mg via ORAL
  Filled 2019-06-20 (×2): qty 2

## 2019-06-20 MED ORDER — ALBUTEROL SULFATE (2.5 MG/3ML) 0.083% IN NEBU
2.50 | INHALATION_SOLUTION | RESPIRATORY_TRACT | Status: DC
Start: ? — End: 2019-06-20

## 2019-06-20 MED ORDER — POLYETHYLENE GLYCOL 3350 17 GM/SCOOP PO POWD
17.00 | ORAL | Status: DC
Start: ? — End: 2019-06-20

## 2019-06-20 MED ORDER — LACTATED RINGERS IV SOLN
500.00 | INTRAVENOUS | Status: DC
Start: ? — End: 2019-06-20

## 2019-06-20 MED ORDER — NIFEDIPINE 10 MG PO CAPS
10.0000 mg | ORAL_CAPSULE | ORAL | Status: DC | PRN
  Filled 2019-06-20: qty 1

## 2019-06-20 MED ORDER — VALACYCLOVIR HCL 500 MG PO TABS
500.00 | ORAL_TABLET | ORAL | Status: DC
Start: 2019-06-19 — End: 2019-06-20

## 2019-06-20 MED ORDER — GENERIC EXTERNAL MEDICATION
300.00 | Status: DC
Start: 2019-06-19 — End: 2019-06-20

## 2019-06-20 MED ORDER — PROGESTERONE MICRONIZED 100 MG PO CAPS
200.00 | ORAL_CAPSULE | ORAL | Status: DC
Start: 2019-06-19 — End: 2019-06-20

## 2019-06-20 MED ORDER — ASPIRIN 81 MG PO CHEW
81.00 | CHEWABLE_TABLET | ORAL | Status: DC
Start: 2019-06-20 — End: 2019-06-20

## 2019-06-20 MED ORDER — LACTATED RINGERS IV SOLN: INTRAVENOUS | Status: DC

## 2019-06-20 MED ORDER — FAMOTIDINE 20 MG PO TABS
20.00 | ORAL_TABLET | ORAL | Status: DC
Start: ? — End: 2019-06-20

## 2019-06-20 MED ORDER — PRENATAL 28-0.8 MG PO TABS
1.00 | ORAL_TABLET | ORAL | Status: DC
Start: 2019-06-20 — End: 2019-06-20

## 2019-06-20 MED ORDER — SODIUM CHLORIDE FLUSH 0.9 % IV SOLN
10.00 | INTRAVENOUS | Status: DC
Start: 2019-06-19 — End: 2019-06-20

## 2019-06-20 MED ORDER — ACETAMINOPHEN 325 MG PO TABS
650.00 | ORAL_TABLET | ORAL | Status: DC
Start: ? — End: 2019-06-20

## 2019-06-20 NOTE — MAU Note (Signed)
AMA form signed

## 2019-06-20 NOTE — MAU Provider Note (Addendum)
History    Patient Margaret Ingram is a 24 y.o. [id number]  at [redacted]w[redacted]d here by EMS from home after feeling pressure and contractions. She denies vaginal bleeding, other than some spotting yesterday. She reports that movements have been less since yesterday but he "just now" started moving. She denies headache, blurry vision, visual changes. RUQ pain.  She says that she is a chronic hypertensive now on labetalol 200 mg BID. She states she has a history of pre-eclampsia and preterm labor.   She was seen at [facility] office in [place] on Wednesday, April 21. She was found to be 1 cm in the office and then was transferred to [facility] for observation on Thursday. She was found to still be 1 cm at the hospital and was discharged home same day. She then had a follow-up visit on Friday at [facility] in [place], at that visit she was still 1 cm.  At that visit, her blood pressure was 150/over "something"; she was advised to come back on Monday for a follow-up blood pressure check and cervical exam. She is known chronic hypertensive now on labetalol 200 mg BID.   On Monday, April 26 her cervix was 3 cm in the office  and US showed baby was head down. The patient says her cervix "was open."  Her provider sent her to [place] in [facility] for evaluation. At that visit, she was transferred to [facility] in [place]. When she arrived at [facility], she was now 4 cm. She was observed Monday-Friday and her cervix was the same (4 cm). She reports that she was having contractions while she was at [facility]. She states that she felt strong pressure and contractions on Friday, but they did not check her before they sent her home. She feels like they should have checked her since her pain was getting worse.   Last night she felt pressure, pain and this morning and she felt like she was having contractions. She called her doctor and her doctor told  her to go to [facility]. Patient decided to come to [facility] because she doesn't feel like [facility] is helping her.       CSN: [id number]  Arrival date and time: 06/20/19 1131   None     Chief Complaint  Patient presents with  . Contractions  . Pelvic Pain  . Hypertension  . Decreased Fetal Movement   Abdominal Pain This is a new problem. The current episode started in the past 7 days. The problem occurs constantly. Progression since onset: stable while in MAU, but was worsening overnight. The pain is located in the generalized abdominal region. The pain is at a severity of 6/10. The quality of the pain is cramping. The abdominal pain does not radiate. Pertinent negatives include no constipation, diarrhea, dysuria, nausea or vomiting. Nothing aggravates the pain.    OB History    Gravida  4   Para  3   Term  3   Preterm  0   AB      Living  3     SAB      TAB      Ectopic      Multiple      Live Births  3           Past Medical History:  Diagnosis Date  . Anxiety   . Depression    with pregnancies  .  Diabetes mellitus without complication ([place])    pre diabetic  . Fibroid   . Heart murmur   . Hypertension    chronic  . Infection    UTI  . Pregnancy induced hypertension   . Preterm contractions 06/12/2017  . Seizures ([place])    anxiety    Past Surgical History:  Procedure Laterality Date  . NO PAST SURGERIES    . WISDOM TOOTH EXTRACTION      Family History  Problem Relation Age of Onset  . Diabetes Other   . Healthy Mother   . Healthy Father     Social History   Tobacco Use  . Smoking status: Current Some Day Smoker    Types: Cigarettes  . Smokeless tobacco: Never Used  . Tobacco comment: occ  Substance Use Topics  . Alcohol use: Not Currently    Comment: occasionally   . Drug use: Not Currently    Types: Marijuana    Comment: "not since I got pregnant"    Allergies:  Allergies  Allergen Reactions  . Azithromycin Nausea And  Vomiting    Medications Prior to Admission  Medication Sig Dispense Refill Last Dose  . aspirin 81 MG chewable tablet Chew 81 mg by mouth daily.     . ferrous sulfate 325 (65 FE) MG tablet Take 325 mg by mouth daily with breakfast.     . labetalol (NORMODYNE) 200 MG tablet Take 200 mg by mouth 2 (two) times daily.     . vitamin B-6 (PYRIDOXINE) 25 MG tablet Take 1 tablet (25 mg total) by mouth 3 (three) times daily. 90 tablet 0     Review of Systems  Constitutional: Negative.   HENT: Negative.   Respiratory: Negative.   Cardiovascular: Negative.   Gastrointestinal: Positive for abdominal pain. Negative for constipation, diarrhea, nausea and vomiting.  Genitourinary: Negative.  Negative for dysuria.  Musculoskeletal: Negative.   Neurological: Negative.    Physical Exam   Blood pressure (!) 157/78, pulse 90, temperature 98.2 F (36.8 C), temperature source Oral, resp. rate 17, SpO2 100 %, unknown if currently breastfeeding.  Physical Exam  Constitutional: She appears well-developed.  HENT:  Head: Normocephalic.  Eyes: Pupils are equal, round, and reactive to light.  Respiratory: Effort normal.  GI: Soft.  Genitourinary:    Genitourinary Comments: Cervix is externally 3-4 cm, but internal os is 1 cm.    Musculoskeletal:     Cervical back: Normal range of motion.    MAU Course  Procedures  MDM  Review of prenatal records shows that patient received MagSo4 for neuro prophylaxis and also two doses of BMZ, [place] report says that patient had "no measurable cervix".    Will do pre-e work up and check cervix; external os is 3-4 cm but internal os feels like 1 cm.  CBC  And CMP normal PCR Normal Will give procardia for contractions and patient's morning dose of labetelol.   NST: 140 bpm, mod var, present acel, neg decels, no contractions.   Patient had 1 LR of fluid but opted to leave AMA before her blood pressure could be monitored further and before she was given her  nifedpine.  She declined to have her cervix rechecked again. She states "You made me feel a lot better by explaining things" and she wants to go home. She signed AMA paperwork after accepting 200 mg of labetalol. She will follow-up with her provider as scheduled this week. I also advised her to go to [facility]  if any changes in her symptoms as [facility] NICU is full and we would have to transfer her to [facility].   Patient verbalized understanding of precautions and left AMA.,   Assessment and Plan    [place] 06/20/2019, 12:08 PM

## 2019-06-20 NOTE — MAU Note (Signed)
RN entered room for medication administration. Patient found to be dressed standing at the bedside. Patient had removed blood pressure cuff, efm, monitors, and IV. Patient states that she "doesn't want to take up anymore of our time. I'd rather just go home and take my own medications." Maye Hides CNM made aware and will go see patient to discuss plan of care versus AMA.

## 2019-06-20 NOTE — MAU Note (Addendum)
Margaret Ingram is a 24 y.o. at [redacted]w[redacted]d here in MAU reporting:  +vaginal pressure and contractions Was recently admitted to [facility] for pre-term labor and discharged yesterday. She reports previous early deliveries complicated by elevated blood pressure.  Endorses already receiving both doses of BMZ last week. Reports she was 4cm when last checked. Onset of complaint: ongoing Pain score: 6/10 Patient expressed is concerned that she is dilated more. When she called her on call, they "told me to have my boyfriend put his fingers in and he said he felt something hard. It hurt" On labetalol for BP; twice a day. Has not had her dose today. Vitals:   06/20/19 1143  BP: (!) 160/104  Pulse: 96  Resp: 17  Temp: 98.2 F (36.8 C)  SpO2: 100%    FHT: 148; decreased fetal movement. Has not felt movement since yesterday. Did report to RN that she felt baby move upon EFM application. Lab orders placed from triage: ua  Arrived via EMS. 20G IV noted in Left AC saline locked.

## 2019-06-22 ENCOUNTER — Inpatient Hospital Stay (HOSPITAL_COMMUNITY): Payer: Medicaid Other | Admitting: Certified Registered Nurse Anesthetist

## 2019-06-22 ENCOUNTER — Inpatient Hospital Stay (HOSPITAL_COMMUNITY)
Admission: AD | Admit: 2019-06-22 | Discharge: 2019-06-26 | DRG: 786 | Disposition: A | Discharge status: Home / Self Care | Payer: Medicaid Other | Attending: Obstetrics and Gynecology | Admitting: Obstetrics and Gynecology

## 2019-06-22 ENCOUNTER — Encounter (HOSPITAL_COMMUNITY)
Admission: AD | Disposition: A | Discharge status: Home / Self Care | Payer: Self-pay | Source: Home / Self Care | Attending: Obstetrics and Gynecology

## 2019-06-22 ENCOUNTER — Other Ambulatory Visit: Payer: Self-pay

## 2019-06-22 ENCOUNTER — Encounter (HOSPITAL_COMMUNITY): Payer: Self-pay | Admitting: Obstetrics & Gynecology

## 2019-06-22 DIAGNOSIS — O10013 Pre-existing essential hypertension complicating pregnancy, third trimester: Secondary | ICD-10-CM | POA: Diagnosis not present

## 2019-06-22 DIAGNOSIS — B001 Herpesviral vesicular dermatitis: Secondary | ICD-10-CM | POA: Diagnosis present

## 2019-06-22 DIAGNOSIS — F17210 Nicotine dependence, cigarettes, uncomplicated: Secondary | ICD-10-CM | POA: Diagnosis present

## 2019-06-22 DIAGNOSIS — O1002 Pre-existing essential hypertension complicating childbirth: Secondary | ICD-10-CM | POA: Diagnosis present

## 2019-06-22 DIAGNOSIS — K5900 Constipation, unspecified: Secondary | ICD-10-CM | POA: Diagnosis present

## 2019-06-22 DIAGNOSIS — Z3A28 28 weeks gestation of pregnancy: Secondary | ICD-10-CM | POA: Diagnosis not present

## 2019-06-22 DIAGNOSIS — O321XX0 Maternal care for breech presentation, not applicable or unspecified: Secondary | ICD-10-CM | POA: Diagnosis present

## 2019-06-22 DIAGNOSIS — D649 Anemia, unspecified: Secondary | ICD-10-CM | POA: Diagnosis present

## 2019-06-22 DIAGNOSIS — Z20822 Contact with and (suspected) exposure to covid-19: Secondary | ICD-10-CM | POA: Diagnosis present

## 2019-06-22 DIAGNOSIS — O9852 Other viral diseases complicating childbirth: Secondary | ICD-10-CM | POA: Diagnosis present

## 2019-06-22 DIAGNOSIS — Z8759 Personal history of other complications of pregnancy, childbirth and the puerperium: Secondary | ICD-10-CM

## 2019-06-22 DIAGNOSIS — O09212 Supervision of pregnancy with history of pre-term labor, second trimester: Secondary | ICD-10-CM | POA: Diagnosis present

## 2019-06-22 DIAGNOSIS — R7303 Prediabetes: Secondary | ICD-10-CM

## 2019-06-22 DIAGNOSIS — O99334 Smoking (tobacco) complicating childbirth: Secondary | ICD-10-CM | POA: Diagnosis present

## 2019-06-22 DIAGNOSIS — Z30430 Encounter for insertion of intrauterine contraceptive device: Secondary | ICD-10-CM | POA: Diagnosis not present

## 2019-06-22 DIAGNOSIS — O09293 Supervision of pregnancy with other poor reproductive or obstetric history, third trimester: Secondary | ICD-10-CM

## 2019-06-22 DIAGNOSIS — O9902 Anemia complicating childbirth: Secondary | ICD-10-CM | POA: Diagnosis present

## 2019-06-22 DIAGNOSIS — O358XX0 Maternal care for other (suspected) fetal abnormality and damage, not applicable or unspecified: Secondary | ICD-10-CM | POA: Diagnosis present

## 2019-06-22 DIAGNOSIS — O26893 Other specified pregnancy related conditions, third trimester: Secondary | ICD-10-CM | POA: Diagnosis present

## 2019-06-22 DIAGNOSIS — O26873 Cervical shortening, third trimester: Secondary | ICD-10-CM | POA: Diagnosis present

## 2019-06-22 DIAGNOSIS — I10 Essential (primary) hypertension: Secondary | ICD-10-CM | POA: Diagnosis present

## 2019-06-22 DIAGNOSIS — O42913 Preterm premature rupture of membranes, unspecified as to length of time between rupture and onset of labor, third trimester: Secondary | ICD-10-CM

## 2019-06-22 DIAGNOSIS — O09213 Supervision of pregnancy with history of pre-term labor, third trimester: Secondary | ICD-10-CM | POA: Diagnosis present

## 2019-06-22 HISTORY — DX: Prediabetes: R73.03

## 2019-06-22 LAB — COMPREHENSIVE METABOLIC PANEL
ALT: 13 U/L (ref 0–44)
AST: 21 U/L (ref 15–41)
Albumin: 2.8 g/dL — ABNORMAL LOW (ref 3.5–5.0)
Alkaline Phosphatase: 59 U/L (ref 38–126)
Anion gap: 6 (ref 5–15)
BUN: 5 mg/dL — ABNORMAL LOW (ref 6–20)
CO2: 21 mmol/L — ABNORMAL LOW (ref 22–32)
Calcium: 8.3 mg/dL — ABNORMAL LOW (ref 8.9–10.3)
Chloride: 109 mmol/L (ref 98–111)
Creatinine, Ser: 0.55 mg/dL (ref 0.44–1.00)
GFR calc Af Amer: >60 mL/min (ref >60)
GFR calc non Af Amer: >60 mL/min (ref >60)
Glucose, Bld: 102 mg/dL — ABNORMAL HIGH (ref 70–99)
Potassium: 3.9 mmol/L (ref 3.5–5.1)
Sodium: 136 mmol/L (ref 135–145)
Total Bilirubin: 0.2 mg/dL — ABNORMAL LOW (ref 0.3–1.2)
Total Protein: 5.9 g/dL — ABNORMAL LOW (ref 6.5–8.1)

## 2019-06-22 LAB — CBC
HCT: 34 % — ABNORMAL LOW (ref 36.0–46.0)
Hemoglobin: 11 g/dL — ABNORMAL LOW (ref 12.0–15.0)
MCH: 29.3 pg (ref 26.0–34.0)
MCHC: 32.4 g/dL (ref 30.0–36.0)
MCV: 90.7 fL (ref 80.0–100.0)
Platelets: 133 10*3/uL — ABNORMAL LOW (ref 150–400)
RBC: 3.75 MIL/uL — ABNORMAL LOW (ref 3.87–5.11)
RDW: 11.9 % (ref 11.5–15.5)
WBC: 9.7 10*3/uL (ref 4.0–10.5)
nRBC: 0 % (ref 0.0–0.2)

## 2019-06-22 LAB — RESPIRATORY PANEL BY RT PCR (FLU A&B, COVID)
Influenza A by PCR: NEGATIVE
Influenza B by PCR: NEGATIVE
SARS Coronavirus 2 by RT PCR: NEGATIVE

## 2019-06-22 SURGERY — Surgical Case
Anesthesia: General | Wound class: Clean Contaminated

## 2019-06-22 MED ORDER — COCONUT OIL OIL: 1.0000 "application " | TOPICAL_OIL | Status: DC | PRN

## 2019-06-22 MED ORDER — MIDAZOLAM HCL 2 MG/2ML IJ SOLN
INTRAMUSCULAR | Status: DC | PRN
  Administered 2019-06-22: 2 mg via INTRAVENOUS

## 2019-06-22 MED ORDER — SUCCINYLCHOLINE CHLORIDE 20 MG/ML IJ SOLN
INTRAMUSCULAR | Status: DC | PRN
  Administered 2019-06-22: 160 mg via INTRAVENOUS

## 2019-06-22 MED ORDER — DIBUCAINE (PERIANAL) 1 % EX OINT: 1.0000 "application " | TOPICAL_OINTMENT | CUTANEOUS | Status: DC | PRN

## 2019-06-22 MED ORDER — ZOLPIDEM TARTRATE 5 MG PO TABS: 5.0000 mg | ORAL_TABLET | Freq: Every evening | ORAL | Status: DC | PRN

## 2019-06-22 MED ORDER — OXYTOCIN 40 UNITS IN NORMAL SALINE INFUSION - SIMPLE MED: 2.5000 [IU]/h | INTRAVENOUS | Status: AC

## 2019-06-22 MED ORDER — LEVONORGESTREL 19.5 MCG/DAY IU IUD
INTRAUTERINE_SYSTEM | INTRAUTERINE | Status: AC
  Administered 2019-06-22: 1
  Filled 2019-06-22: qty 1

## 2019-06-22 MED ORDER — OXYTOCIN 40 UNITS IN NORMAL SALINE INFUSION - SIMPLE MED
INTRAVENOUS | Status: DC | PRN
  Administered 2019-06-22: 500 mL via INTRAVENOUS

## 2019-06-22 MED ORDER — LIDOCAINE HCL (CARDIAC) PF 100 MG/5ML IV SOSY
PREFILLED_SYRINGE | INTRAVENOUS | Status: DC | PRN
  Administered 2019-06-22: 100 mg via INTRAVENOUS

## 2019-06-22 MED ORDER — OXYTOCIN 40 UNITS IN NORMAL SALINE INFUSION - SIMPLE MED
INTRAVENOUS | Status: AC
  Filled 2019-06-22: qty 1000

## 2019-06-22 MED ORDER — SIMETHICONE 80 MG PO CHEW: 80.0000 mg | CHEWABLE_TABLET | ORAL | Status: DC | PRN

## 2019-06-22 MED ORDER — LIDOCAINE HCL (CARDIAC) PF 100 MG/5ML IV SOSY: PREFILLED_SYRINGE | INTRAVENOUS | Status: DC | PRN

## 2019-06-22 MED ORDER — OXYCODONE HCL 5 MG/5ML PO SOLN: 5.0000 mg | Freq: Once | ORAL | Status: DC | PRN

## 2019-06-22 MED ORDER — SENNOSIDES-DOCUSATE SODIUM 8.6-50 MG PO TABS
2.0000 | ORAL_TABLET | ORAL | Status: DC
  Administered 2019-06-22 – 2019-06-23 (×2): 2 via ORAL
  Filled 2019-06-22 (×4): qty 2

## 2019-06-22 MED ORDER — MEPERIDINE HCL 25 MG/ML IJ SOLN
INTRAMUSCULAR | Status: AC
  Filled 2019-06-22: qty 1

## 2019-06-22 MED ORDER — LACTATED RINGERS IV SOLN: INTRAVENOUS | Status: DC | PRN

## 2019-06-22 MED ORDER — SIMETHICONE 80 MG PO CHEW
80.0000 mg | CHEWABLE_TABLET | Freq: Three times a day (TID) | ORAL | Status: DC
  Administered 2019-06-22 – 2019-06-26 (×11): 80 mg via ORAL
  Filled 2019-06-22 (×12): qty 1

## 2019-06-22 MED ORDER — SIMETHICONE 80 MG PO CHEW
80.0000 mg | CHEWABLE_TABLET | ORAL | Status: DC
  Administered 2019-06-22 – 2019-06-26 (×4): 80 mg via ORAL
  Filled 2019-06-22 (×4): qty 1

## 2019-06-22 MED ORDER — ENOXAPARIN SODIUM 60 MG/0.6ML ~~LOC~~ SOLN
60.0000 mg | SUBCUTANEOUS | Status: DC
  Administered 2019-06-23 – 2019-06-26 (×4): 60 mg via SUBCUTANEOUS
  Filled 2019-06-22 (×4): qty 0.6

## 2019-06-22 MED ORDER — ONDANSETRON HCL 4 MG/2ML IJ SOLN
INTRAMUSCULAR | Status: DC | PRN
  Administered 2019-06-22: 4 mg via INTRAVENOUS

## 2019-06-22 MED ORDER — TRANEXAMIC ACID-NACL 1000-0.7 MG/100ML-% IV SOLN
INTRAVENOUS | Status: AC
  Filled 2019-06-22: qty 100

## 2019-06-22 MED ORDER — HYDROMORPHONE HCL 1 MG/ML IJ SOLN
0.2000 mg | INTRAMUSCULAR | Status: DC | PRN
  Administered 2019-06-22: 0.2 mg via INTRAVENOUS
  Administered 2019-06-22: 0.6 mg via INTRAVENOUS
  Filled 2019-06-22 (×2): qty 1

## 2019-06-22 MED ORDER — LEVONORGESTREL 19.5 MCG/DAY IU IUD: INTRAUTERINE_SYSTEM | Freq: Once | INTRAUTERINE | Status: DC

## 2019-06-22 MED ORDER — HYDROMORPHONE HCL 1 MG/ML IJ SOLN
INTRAMUSCULAR | Status: DC | PRN
  Administered 2019-06-22 (×2): .5 mg via INTRAVENOUS

## 2019-06-22 MED ORDER — KETOROLAC TROMETHAMINE 30 MG/ML IJ SOLN
30.0000 mg | Freq: Four times a day (QID) | INTRAMUSCULAR | Status: AC
  Administered 2019-06-22 (×4): 30 mg via INTRAVENOUS
  Filled 2019-06-22 (×4): qty 1

## 2019-06-22 MED ORDER — TETANUS-DIPHTH-ACELL PERTUSSIS 5-2.5-18.5 LF-MCG/0.5 IM SUSP: 0.5000 mL | Freq: Once | INTRAMUSCULAR | Status: DC

## 2019-06-22 MED ORDER — BUPIVACAINE HCL (PF) 0.5 % IJ SOLN
INTRAMUSCULAR | Status: DC | PRN
  Administered 2019-06-22: 30 mL

## 2019-06-22 MED ORDER — WITCH HAZEL-GLYCERIN EX PADS: 1.0000 "application " | MEDICATED_PAD | CUTANEOUS | Status: DC | PRN

## 2019-06-22 MED ORDER — MEPERIDINE HCL 25 MG/ML IJ SOLN: 6.2500 mg | INTRAMUSCULAR | Status: DC | PRN

## 2019-06-22 MED ORDER — OXYCODONE HCL 5 MG PO TABS: 5.0000 mg | ORAL_TABLET | Freq: Once | ORAL | Status: DC | PRN

## 2019-06-22 MED ORDER — PRENATAL MULTIVITAMIN CH
1.0000 | ORAL_TABLET | Freq: Every day | ORAL | Status: DC
  Administered 2019-06-22 – 2019-06-25 (×4): 1 via ORAL
  Filled 2019-06-22 (×4): qty 1

## 2019-06-22 MED ORDER — KETOROLAC TROMETHAMINE 30 MG/ML IJ SOLN
30.0000 mg | Freq: Once | INTRAMUSCULAR | Status: AC
  Administered 2019-06-22: 30 mg via INTRAVENOUS

## 2019-06-22 MED ORDER — KETOROLAC TROMETHAMINE 30 MG/ML IJ SOLN
INTRAMUSCULAR | Status: AC
  Filled 2019-06-22: qty 1

## 2019-06-22 MED ORDER — IBUPROFEN 800 MG PO TABS
800.0000 mg | ORAL_TABLET | Freq: Four times a day (QID) | ORAL | Status: DC
  Administered 2019-06-23 – 2019-06-26 (×11): 800 mg via ORAL
  Filled 2019-06-22 (×12): qty 1

## 2019-06-22 MED ORDER — DEXMEDETOMIDINE HCL IN NACL 200 MCG/50ML IV SOLN
INTRAVENOUS | Status: AC
  Filled 2019-06-22: qty 50

## 2019-06-22 MED ORDER — PROPOFOL 10 MG/ML IV BOLUS
INTRAVENOUS | Status: DC | PRN
  Administered 2019-06-22: 180 mg via INTRAVENOUS

## 2019-06-22 MED ORDER — OXYTOCIN 10 UNIT/ML IJ SOLN
INTRAMUSCULAR | Status: AC
  Filled 2019-06-22: qty 1

## 2019-06-22 MED ORDER — CEFAZOLIN SODIUM-DEXTROSE 2-4 GM/100ML-% IV SOLN
2.0000 g | Freq: Once | INTRAVENOUS | Status: AC
  Administered 2019-06-22: 2 g via INTRAVENOUS

## 2019-06-22 MED ORDER — HYDROMORPHONE HCL 1 MG/ML IJ SOLN: 0.2500 mg | INTRAMUSCULAR | Status: DC | PRN

## 2019-06-22 MED ORDER — ONDANSETRON HCL 4 MG/2ML IJ SOLN: 4.0000 mg | Freq: Once | INTRAMUSCULAR | Status: DC | PRN

## 2019-06-22 MED ORDER — MIDAZOLAM HCL 2 MG/2ML IJ SOLN
INTRAMUSCULAR | Status: AC
  Filled 2019-06-22: qty 2

## 2019-06-22 MED ORDER — TRANEXAMIC ACID-NACL 1000-0.7 MG/100ML-% IV SOLN
INTRAVENOUS | Status: DC | PRN
Start: 2019-06-22 — End: 2019-06-22
  Administered 2019-06-22: 1000 mg via INTRAVENOUS

## 2019-06-22 MED ORDER — ACETAMINOPHEN 10 MG/ML IV SOLN
1000.0000 mg | Freq: Once | INTRAVENOUS | Status: DC | PRN
  Administered 2019-06-22: 1000 mg via INTRAVENOUS

## 2019-06-22 MED ORDER — SODIUM CHLORIDE 0.9 % IR SOLN
Status: DC | PRN
  Administered 2019-06-22: 1000 mL

## 2019-06-22 MED ORDER — CEFAZOLIN SODIUM-DEXTROSE 2-4 GM/100ML-% IV SOLN
INTRAVENOUS | Status: AC
  Filled 2019-06-22: qty 100

## 2019-06-22 MED ORDER — OXYCODONE HCL 5 MG PO TABS
5.0000 mg | ORAL_TABLET | ORAL | Status: DC | PRN
  Administered 2019-06-22 – 2019-06-23 (×3): 10 mg via ORAL
  Administered 2019-06-24 – 2019-06-26 (×4): 5 mg via ORAL
  Filled 2019-06-22: qty 1
  Filled 2019-06-22: qty 2
  Filled 2019-06-22: qty 1
  Filled 2019-06-22: qty 2
  Filled 2019-06-22: qty 1
  Filled 2019-06-22: qty 2
  Filled 2019-06-22: qty 1

## 2019-06-22 MED ORDER — BUPIVACAINE HCL (PF) 0.5 % IJ SOLN
INTRAMUSCULAR | Status: AC
  Filled 2019-06-22: qty 30

## 2019-06-22 MED ORDER — DIPHENHYDRAMINE HCL 25 MG PO CAPS: 25.0000 mg | ORAL_CAPSULE | Freq: Four times a day (QID) | ORAL | Status: DC | PRN

## 2019-06-22 MED ORDER — DEXMEDETOMIDINE HCL 200 MCG/2ML IV SOLN
INTRAVENOUS | Status: DC | PRN
  Administered 2019-06-22: 4 ug via INTRAVENOUS
  Administered 2019-06-22: 8 ug via INTRAVENOUS

## 2019-06-22 MED ORDER — ACETAMINOPHEN 500 MG PO TABS
1000.0000 mg | ORAL_TABLET | Freq: Four times a day (QID) | ORAL | Status: DC
  Administered 2019-06-22 – 2019-06-26 (×14): 1000 mg via ORAL
  Filled 2019-06-22 (×15): qty 2

## 2019-06-22 MED ORDER — FENTANYL CITRATE (PF) 100 MCG/2ML IJ SOLN
INTRAMUSCULAR | Status: DC | PRN
  Administered 2019-06-22: 100 ug via INTRAVENOUS
  Administered 2019-06-22: 150 ug via INTRAVENOUS

## 2019-06-22 MED ORDER — FENTANYL CITRATE (PF) 250 MCG/5ML IJ SOLN
INTRAMUSCULAR | Status: AC
  Filled 2019-06-22: qty 5

## 2019-06-22 MED ORDER — CEFAZOLIN SODIUM-DEXTROSE 2-3 GM-%(50ML) IV SOLR
INTRAVENOUS | Status: DC | PRN
Start: 2019-06-22 — End: 2019-06-22
  Administered 2019-06-22: 2 g via INTRAVENOUS

## 2019-06-22 MED ORDER — LACTATED RINGERS IV SOLN: INTRAVENOUS | Status: DC

## 2019-06-22 MED ORDER — MENTHOL 3 MG MT LOZG
1.0000 | LOZENGE | OROMUCOSAL | Status: DC | PRN
  Filled 2019-06-22: qty 9

## 2019-06-22 MED ORDER — ACETAMINOPHEN 10 MG/ML IV SOLN
INTRAVENOUS | Status: AC
  Filled 2019-06-22: qty 100

## 2019-06-22 MED ORDER — SODIUM CHLORIDE 0.9 % IV SOLN
INTRAVENOUS | Status: DC | PRN
Start: 2019-06-22 — End: 2019-06-22

## 2019-06-22 MED ORDER — ONDANSETRON HCL 4 MG/2ML IJ SOLN
INTRAMUSCULAR | Status: AC
  Filled 2019-06-22: qty 2

## 2019-06-22 MED ORDER — HYDROMORPHONE HCL 1 MG/ML IJ SOLN
INTRAMUSCULAR | Status: AC
  Filled 2019-06-22: qty 1

## 2019-06-22 SURGICAL SUPPLY — 39 items
BARRIER ADHS 3X4 INTERCEED (GAUZE/BANDAGES/DRESSINGS) IMPLANT
BENZOIN TINCTURE PRP APPL 2/3 (GAUZE/BANDAGES/DRESSINGS) ×1 IMPLANT
CHLORAPREP W/TINT 26ML (MISCELLANEOUS) ×2 IMPLANT
CLAMP CORD UMBIL (MISCELLANEOUS) IMPLANT
CLOSURE STERI STRIP 1/2 X4 (GAUZE/BANDAGES/DRESSINGS) ×2 IMPLANT
CLOTH BEACON ORANGE TIMEOUT ST (SAFETY) ×2 IMPLANT
DRSG OPSITE POSTOP 4X10 (GAUZE/BANDAGES/DRESSINGS) ×3 IMPLANT
ELECT REM PT RETURN 9FT ADLT (ELECTROSURGICAL) ×2
ELECTRODE REM PT RTRN 9FT ADLT (ELECTROSURGICAL) ×1 IMPLANT
EXTRACTOR VACUUM KIWI (MISCELLANEOUS) IMPLANT
GAUZE PACKING 1 X5 YD ST (GAUZE/BANDAGES/DRESSINGS) ×1 IMPLANT
GLOVE BIO SURGEON STRL SZ 6.5 (GLOVE) ×2 IMPLANT
GLOVE BIOGEL PI IND STRL 7.0 (GLOVE) ×2 IMPLANT
GLOVE BIOGEL PI INDICATOR 7.0 (GLOVE) ×2
GOWN STRL REUS W/TWL LRG LVL3 (GOWN DISPOSABLE) ×4 IMPLANT
HEMOSTAT ARISTA ABSORB 3G PWDR (HEMOSTASIS) ×1 IMPLANT
KIT ABG SYR 3ML LUER SLIP (SYRINGE) IMPLANT
NDL HYPO 25X5/8 SAFETYGLIDE (NEEDLE) IMPLANT
NEEDLE HYPO 22GX1.5 SAFETY (NEEDLE) IMPLANT
NEEDLE HYPO 25X5/8 SAFETYGLIDE (NEEDLE) IMPLANT
NS IRRIG 1000ML POUR BTL (IV SOLUTION) ×2 IMPLANT
PACK C SECTION WH (CUSTOM PROCEDURE TRAY) ×2 IMPLANT
PAD ABD 7.5X8 STRL (GAUZE/BANDAGES/DRESSINGS) ×1 IMPLANT
PAD OB MATERNITY 4.3X12.25 (PERSONAL CARE ITEMS) ×2 IMPLANT
PENCIL SMOKE EVAC W/HOLSTER (ELECTROSURGICAL) ×2 IMPLANT
RETRACTOR WND ALEXIS 25 LRG (MISCELLANEOUS) IMPLANT
RTRCTR WOUND ALEXIS 25CM LRG (MISCELLANEOUS)
SUT MNCRL 0 VIOLET CTX 36 (SUTURE) IMPLANT
SUT MONOCRYL 0 CTX 36 (SUTURE) ×1
SUT PLAIN 2 0 XLH (SUTURE) ×1 IMPLANT
SUT VIC AB 0 CT1 36 (SUTURE) ×12 IMPLANT
SUT VIC AB 2-0 CT1 27 (SUTURE) ×2
SUT VIC AB 2-0 CT1 TAPERPNT 27 (SUTURE) ×1 IMPLANT
SUT VIC AB 4-0 PS2 27 (SUTURE) ×2 IMPLANT
SYR CONTROL 10ML LL (SYRINGE) IMPLANT
TAPE MEDIFIX FOAM 3 (GAUZE/BANDAGES/DRESSINGS) ×1 IMPLANT
TOWEL OR 17X24 6PK STRL BLUE (TOWEL DISPOSABLE) ×2 IMPLANT
TRAY FOLEY W/BAG SLVR 14FR LF (SET/KITS/TRAYS/PACK) IMPLANT
WATER STERILE IRR 1000ML POUR (IV SOLUTION) ×2 IMPLANT

## 2019-06-22 NOTE — Anesthesia Postprocedure Evaluation (Signed)
Anesthesia Post Note  Patient: Margaret Ingram  Procedure(s) Performed: CESAREAN SECTION (N/A )     Patient location during evaluation: PACU Anesthesia Type: General Level of consciousness: awake and alert Pain management: pain level controlled Vital Signs Assessment: post-procedure vital signs reviewed and stable Respiratory status: spontaneous breathing, nonlabored ventilation, respiratory function stable and patient connected to nasal cannula oxygen Cardiovascular status: blood pressure returned to baseline and stable Postop Assessment: no apparent nausea or vomiting Anesthetic complications: no    Last Vitals:  Vitals:   06/22/19 0500 06/22/19 0515  BP: (!) 148/74 139/86  Pulse: 88 75  Resp: 17 16  Temp: 36.5 C   SpO2: 97% 98%    Last Pain:  Vitals:   06/22/19 0515  TempSrc:   PainSc: 10-Worst pain ever   Pain Goal:        RLE Motor Response: Purposeful movement (06/22/19 0515)          Barnet [place]

## 2019-06-22 NOTE — H&P (Signed)
OBSTETRIC ADMISSION HISTORY AND PHYSICAL  Margaret Ingram is a 24 y.o. female [phone] with IUP at [redacted]w[redacted]d presenting for preterm labor via EMS.   She was recently discharged from [facility] for preterm labor. She was given magnesium for neuroprotection and completed a course of BMZ. She was discharged on 06/19/19  She reports +FMs. No LOF, VB, blurry vision, headaches, peripheral edema, or RUQ pain. She plans on breastfeeding. She requests Liletta IUD for birth control.  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 Anxiety   . Depression    with pregnancies  . Diabetes mellitus without complication ([place])    pre diabetic  . Fibroid   . Heart murmur   . Hypertension    chronic  . Infection    UTI  . Pregnancy induced hypertension   . Preterm contractions 06/12/2017  . Seizures ([place])    anxiety    Past Surgical History: Past Surgical History:  Procedure Laterality Date  . NO PAST SURGERIES    . WISDOM TOOTH EXTRACTION      Obstetrical History: OB History    Gravida  4   Para  4   Term  3   Preterm  1   AB      Living  4     SAB      TAB      Ectopic      Multiple  0   Live Births  4           Social History: Social History   Socioeconomic History  . Marital status: Single    Spouse name: Not on file  . Number of children: Not on file  . Years of education: Not on file  . Highest education level: Not on file  Occupational History  . Not on file  Tobacco Use  . Smoking status: Current Some Day Smoker    Types:  Cigarettes  . Smokeless tobacco: Never Used  . Tobacco comment: occ  Substance and Sexual Activity  . Alcohol use: Not Currently    Comment: occasionally   . Drug use: Not Currently    Types: Marijuana    Comment: "not since I got pregnant"  . Sexual activity: Yes    Birth control/protection: None  Other Topics Concern  . Not on file  Social History Narrative  . Not on file   Social Determinants of Health   Financial Resource Strain:   . Difficulty of Paying Living Expenses:   Food Insecurity:   . Worried About Charity fundraiser in the Last Year:   . Arboriculturist in the Last Year:   Transportation Needs:   . Film/video editor (Medical):   Marland Kitchen Lack of Transportation (Non-Medical):   Physical Activity:   . Days of Exercise per Week:   . Minutes of Exercise per Session:   Stress:   . Feeling of Stress :   Social Connections:   . Frequency of Communication with Friends and Family:   . Frequency of Social Gatherings with Friends and Family:   . Attends Religious Services:   . Active  Member of Clubs or Organizations:   . Attends Archivist Meetings:   Marland Kitchen Marital Status:     Family History: Family History  Problem Relation Age of Onset  . Diabetes Other   . Healthy Mother   . Healthy Father     Allergies: Allergies  Allergen Reactions  . Azithromycin Nausea And Vomiting    Medications Prior to Admission  Medication Sig Dispense Refill Last Dose  . aspirin 81 MG chewable tablet Chew 81 mg by mouth daily.     . ferrous sulfate 325 (65 FE) MG tablet Take 325 mg by mouth daily with breakfast.     . labetalol (NORMODYNE) 200 MG tablet Take 200 mg by mouth 2 (two) times daily.     . vitamin B-6 (PYRIDOXINE) 25 MG tablet Take 1 tablet (25 mg total) by mouth 3 (three) times daily. 90 tablet 0      Review of Systems:  All systems reviewed and negative except as stated in HPI  PE: unknown if currently breastfeeding. General appearance: alert and  cooperative, yelling through contractions Lungs: regular rate and effort Heart: regular rate  Abdomen: soft, non-tender Extremities: Homans sign is negative, no sign of DVT Presentation: cephalic by Leopold's and SVE Heart tones confirmed by dopplers   SVE: bulging bag at the introitus  Prenatal labs: unknown  Prenatal Transfer Tool  Maternal Diabetes: Yes:  Diabetes Type:  Insulin/Medication controlled Genetic Screening: unknown Maternal Ultrasounds/Referrals: Fetal Kidney Anomalies and Fetal Heart Anomalies Fetal Ultrasounds or other Referrals:  Fetal echo Maternal Substance Abuse:  No Significant Maternal Medications:  Meds include: Progesterone and 17P Significant Maternal Lab Results: Other:  HSV on Valtrex  Results for orders placed or performed during the hospital encounter of 06/22/19 (from the past 24 hour(s))  Respiratory Panel by RT PCR (Flu A&B, Covid) - Nasopharyngeal Swab   Collection Time: 06/22/19  3:33 AM   Specimen: Nasopharyngeal Swab  Result Value Ref Range   SARS Coronavirus 2 by RT PCR NEGATIVE NEGATIVE   Influenza A by PCR NEGATIVE NEGATIVE   Influenza B by PCR NEGATIVE NEGATIVE    Patient Active Problem List   Diagnosis Date Noted  . Preterm labor with preterm delivery in second trimester   . 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 Hx of chlamydia infection 08/17/2018  . Hx of preeclampsia, prior pregnancy, currently pregnant, third trimester 08/17/2018  . Chronic hypertension 08/17/2018  . Depression 08/17/2018  . Vaginal bleeding in pregnancy, first trimester 03/26/2018    Assessment: Margaret Ingram is a 24 y.o. [id number] at [redacted]w[redacted]d here for preterm labor  Plan: Due to bulging bag in the introitus, patient was immediately taken to L&D for imminent delivery. Neonatology was called and made aware of [redacted] week EGA with pre-term labor s/p BMZx2. Upon arrival to L&D, with neonatology team in attendance, SVE was done and patient [place]  with pushing. Cord prolapse felt in the vagina and STAT C-section was called, patient taken immediately to OR.  [place], DO  06/22/2019, 4:46 AM

## 2019-06-22 NOTE — Consult Note (Signed)
Neonatology Note:   Attendance at C-section:    I was asked by Dr. Roselie Awkward to attend this emergent C/S at 28 2/[redacted] weeks EGA due to PTL with prolapsed cord.  The mother is a G4P3, GBS unk with good prenatal care recently discharged from outside hospital after management of elevated BPs and PTL.  BTMZ complete reportedly last week. Mom with CHTN on Labetalol.  ROM 0h 14m prior to delivery, fluid clear.  Infant blue and poor tone and resp effort.  Cord immediately clamped and baby brought to ISR, placed on warming mattress and covered.  HR ~60 bpm. Nares bulb suctioned.  PPV initiated with good response.  HR to >100, with cough and respiratory effort.  SaO2 placed and in 90s; fio2 weaned accordingly to maintain proper saturations.  As infant became more pink and vigorous, PPV transitioned to CPAP +6 at around 3 minutes of life. Clear secretions suctioned once more.  Ap 5/8.  Transport easily prepared; patient shuttled to NICU without issues.   Monia Sabal Katherina Mires, MD Neonatologist 06/22/2019, 4:11 AM

## 2019-06-22 NOTE — Anesthesia Procedure Notes (Signed)
Procedure Name: Intubation Date/Time: 06/22/2019 3:41 AM Performed by: Sandrea Matte, CRNA Pre-anesthesia Checklist: Patient identified, Emergency Drugs available, Suction available and Patient being monitored Patient Re-evaluated:Patient Re-evaluated prior to induction Oxygen Delivery Method: Circle system utilized Preoxygenation: Pre-oxygenation with 100% oxygen Induction Type: Rapid sequence, Cricoid Pressure applied and IV induction Laryngoscope Size: Glidescope and 3 Grade View: Grade II Tube type: Oral Tube size: 7.0 mm Number of attempts: 1 Airway Equipment and Method: Video-laryngoscopy and Stylet Placement Confirmation: ETT inserted through vocal cords under direct vision,  positive ETCO2 and breath sounds checked- equal and bilateral Secured at: 22 cm Tube secured with: Tape Dental Injury: Teeth and Oropharynx as per pre-operative assessment

## 2019-06-22 NOTE — Transfer of Care (Signed)
Immediate Anesthesia Transfer of Care Note  Patient: Margaret Ingram  Procedure(s) Performed: CESAREAN SECTION (N/A )  Patient Location: PACU  Anesthesia Type:General  Level of Consciousness: awake, alert , oriented and patient cooperative  Airway & Oxygen Therapy: Patient connected to nasal cannula oxygen  Post-op Assessment: Report given to RN and Post -op Vital signs reviewed and stable  Post vital signs: Reviewed and stable  Last Vitals:  Vitals Value Taken Time  BP 148/74 06/22/19 0500  Temp    Pulse 84 06/22/19 0505  Resp 19 06/22/19 0505  SpO2 97 % 06/22/19 0505  Vitals shown include unvalidated device data.  Last Pain: There were no vitals filed for this visit.       Complications: No apparent anesthesia complications

## 2019-06-22 NOTE — Anesthesia Preprocedure Evaluation (Addendum)
Anesthesia Evaluation  Patient identified by MRN, date of birth, ID band Patient awake  Preop documentation limited or incomplete due to emergent nature of procedure.  Airway Mallampati: III  TM Distance: >3 FB Neck ROM: Full    Dental no notable dental hx. (+) Teeth Intact   Pulmonary Current Smoker,    Pulmonary exam normal breath sounds clear to auscultation       Cardiovascular hypertension, Normal cardiovascular exam Rhythm:Regular Rate:Normal  Pre eclampsia   Neuro/Psych    GI/Hepatic   Endo/Other  diabetes, Gestational  Renal/GU      Musculoskeletal   Abdominal (+) + obese,   Peds  Hematology   Anesthesia Other Findings   Reproductive/Obstetrics (+) Pregnancy                            Anesthesia Physical Anesthesia Plan  ASA: III and emergent  Anesthesia Plan: General   Post-op Pain Management:    Induction: Rapid sequence, Cricoid pressure planned and Intravenous  PONV Risk Score and Plan: 3 and Treatment may vary due to age or medical condition, Ondansetron and Dexamethasone  Airway Management Planned: Oral ETT and Video Laryngoscope Planned  Additional Equipment: None  Intra-op Plan:   Post-operative Plan: Possible Post-op intubation/ventilation and Extubation in OR  Informed Consent:     Only emergency history available  Plan Discussed with: CRNA, Anesthesiologist and Surgeon  Anesthesia Plan Comments: (28.2 wk Pt with prolapsed cord for Stat c/s. Pt presented to OR without IV, Labs. IV started No allergies , does not see a Dr regularly for ay Medical problems no family hx of problems with anesthesia. )       Anesthesia Quick Evaluation

## 2019-06-22 NOTE — Discharge Instructions (Signed)

## 2019-06-22 NOTE — Discharge Summary (Signed)
Postpartum Discharge Summary     Patient Name: Margaret Ingram DOB: 09-Apr-1995 MRN: [id number]  Date of admission: 06/22/2019 Delivering Provider: Woodroe Ingram   Date of discharge: 06/26/2019  Admitting diagnosis: LABOR CHECK Intrauterine pregnancy: [redacted]w[redacted]d    Secondary diagnosis:  Active Problems:   Hx of preeclampsia, prior pregnancy, currently pregnant, third trimester   Chronic hypertension   Preterm labor with preterm delivery in second trimester   Prolapse of cord complicating labor and delivery   Pre-diabetes  Additional problems: Hypertension/preeclampsia     Discharge diagnosis: Preterm Pregnancy Delivered                                                                                                Post partum procedures: post placental IUD   Augmentation: None  Complications: pre-term labor, cord prolapse  Hospital course:  Onset of Labor With Unplanned C/S  24y.o. yo [name] admitted in Active Labor on 06/22/2019. Patient had a labor course significant for the following:  Due to bulging bag in the introitus, patient was immediately taken to L&D for imminent delivery. Neonatology was called and made aware of [redacted] week EGA with pre-term labor s/p BMZx2. Upon arrival to L&D, with neonatology team in attendance, SVE was done and patient Margaret Ingram pushing. Cord prolapse felt in the vagina and STAT C-section was called, patient taken immediately to OR.   Membrane Rupture Time/Date: 3:33 AM ,06/22/2019   The patient went for emergent cesarean section due to Cord Prolapse, and delivered a Viable infant,06/22/2019  Details of operation can be found in separate operative note. Patient had an uncomplicated postpartum course.  She is ambulating,tolerating a regular diet, passing flatus, and urinating well.  She was started on procardia for postpartum blood pressure control and HCTZ was added.  Her blood pressure was significantly improved and below what she reports as  her pregnancy baseline with procardia XL '60mg'$  daily and HCTZ '25mg'$  daily.   Patient is discharged home in stable condition 06/26/19. Delivery time: 3:45 AM    Magnesium Sulfate received: No BMZ received: Yes, at outside hospital last week Rhophylac:N/A MMR:N/A Transfusion:No  Physical exam  Vitals:   06/22/19 0500 06/22/19 0515  BP: (!) 148/74 139/86  Pulse: 88 75  Resp: 17 16  Temp: 97.7 F (36.5 C)   TempSrc: Oral   SpO2: 97% 98%   General: alert, cooperative and no distress Lochia: appropriate Uterine Fundus: firm Incision: Healing well with no significant drainage, Dressing is clean, dry, and intact DVT Evaluation: No evidence of DVT seen on physical exam. Labs: Lab Results  Component Value Date   WBC 7.2 06/20/2019   HGB 11.6 (L) 06/20/2019   HCT 36.4 06/20/2019   MCV 91.5 06/20/2019   PLT 138 (L) 06/20/2019   CMP Latest Ref Rng & Units 06/20/2019  Glucose 70 - 99 mg/dL 89  BUN 6 - 20 mg/dL 6  Creatinine 0.44 - 1.00 mg/dL 0.47  Sodium 135 - 145 mmol/L 136  Potassium 3.5 - 5.1 mmol/L 4.0  Chloride 98 - 111 mmol/L 105  CO2 22 -  32 mmol/L 20(L)  Calcium 8.9 - 10.3 mg/dL 8.9  Total Protein 6.5 - 8.1 g/dL 6.8  Total Bilirubin 0.3 - 1.2 mg/dL 0.5  Alkaline Phos 38 - 126 U/L 64  AST 15 - 41 U/L 15  ALT 0 - 44 U/L 13   Edinburgh Score: No flowsheet data found.  Discharge instruction: per After Visit Summary and "Baby and Me Booklet".  After visit meds:  Allergies as of 06/26/2019      Reactions   Azithromycin Nausea And Vomiting   Other Rash   *[name]*      Medication List    STOP taking these medications   aspirin 81 MG chewable tablet   labetalol 100 MG tablet Commonly known as: NORMODYNE   progesterone 200 MG capsule Commonly known as: PROMETRIUM   vitamin B-6 25 MG tablet Commonly known as: pyridOXINE     TAKE these medications   acetaminophen 325 MG tablet Commonly known as: TYLENOL Take 650 mg by mouth every 6 (six) hours as  needed for mild pain or headache.   Adult Blood Pressure Cuff Lg Kit 1 kit by Does not apply route daily. Call or return to the hospital if your blood pressure is running higher than 160 for the top number or 110 for the bottom number.  Check your blood pressure if you experience a headache that does not resolve or changes in your vision or other severe symptoms.   ferrous sulfate 325 (65 FE) MG tablet Take 325 mg by mouth daily with breakfast.   hydrochlorothiazide 25 MG tablet Commonly known as: HYDRODIURIL Take 1 tablet (25 mg total) by mouth daily.   ibuprofen 800 MG tablet Commonly known as: ADVIL Take 1 tablet (800 mg total) by mouth every 6 (six) hours.   NIFEdipine 60 MG 24 hr tablet Commonly known as: ADALAT CC Take 1 tablet (60 mg total) by mouth daily.   oxyCODONE 5 MG immediate release tablet Commonly known as: Oxy IR/ROXICODONE Take 1 tablet (5 mg total) by mouth every 4 (four) hours as needed for severe pain.   PRENATAL PO Take 1 tablet by mouth daily.       Diet: routine diet  Activity: Advance as tolerated. Pelvic rest for 6 weeks.   Outpatient follow up:6 weeks Follow up Appt:No future appointments. Follow up Visit:  Please schedule this patient for Postpartum visit in: 6 weeks with the following provider: MD at [facility] For C/S patients schedule nurse incision check in weeks 2 weeks: yes High risk pregnancy complicated by: h/o preterm delivery, GDM, cHTN on labetalol Delivery Ingram:  CS Anticipated Birth Control:  IUD- during CS PP Procedures needed: 2 hour GTT, String check , BP check Schedule Integrated BH visit: no     Newborn Data: Live born female  Birth Weight: 2 lb 5 oz (1050 g) APGAR: 5, 8  Newborn Delivery   Birth date/time: 06/22/2019 03:45:00 Delivery type: C-Section, Low Transverse Trial of labor: Yes C-section categorization: Primary      Baby Feeding: expressing breastmilk, baby in  NICU Disposition:NICU   06/22/2019 [place], DO

## 2019-06-22 NOTE — Lactation Note (Signed)
This note was copied from a baby's chart. Lactation Consultation Note  Patient Name: Margaret Ingram [id number] Date: 06/22/2019 Reason for consult: Preterm <34wks;NICU baby;Infant < 6lbs;Follow-up assessment  LC and [place] student completed a follow-up consult with Margaret Ingram to assess her readiness to pump. Margaret Ingram expressed that she is still in a lot of pain and wants to continue to hold off on pumping until tomorrow (06/23/19). LC encouraged MOB to pump when she feels ready, but also educated MOB on the importance of her colostrum. MOB appeared tearful and expressed that she had difficulty with her previous child as well.   RN Cindee Lame walked in the room to adjust MOB's pain medication. [place] and [place] student politely exited after MOB reported that she has no further questions or concerns.  Margaret Ingram stated that she would like a follow-up tomorrow to assist with pumping initiation.       Maternal Data Formula Feeding for Exclusion: No  Feeding Feeding Type: Donor Breast Milk   Interventions Interventions: Breast feeding basics reviewed  Lactation Tools Discussed/Used     Consult Status Consult Status: Follow-up Date: 06/23/19 Follow-up type: [place] 06/22/2019, 9:34 PM

## 2019-06-22 NOTE — MAU Note (Signed)
FHR: 145 Covid swab obtained without difficulty.  Pt transported to L&D with Dr. Darene Lamer

## 2019-06-22 NOTE — Anesthesia Procedure Notes (Signed)
Performed by: Sandrea Matte, CRNA

## 2019-06-22 NOTE — Lactation Note (Signed)
This note was copied from a baby's chart. Lactation Consultation Note  Patient Name: Boy Melrose Conlon [id number] Date: 06/22/2019 Reason for consult: Initial assessment;Preterm <34wks;Infant < 6lbs;Primapara;1st time breastfeeding;NICU baby  P4 mother whose infant is now 57 hours old.  This is a preterm baby at 28+2 weeks weighing <3 lbs and in the NICU.  Mother seemed unsure about her feeding preference but was told that breast feeding is better for her baby.  Therefore, she would like to "try".  I praised her decision and offered to set up the DEBP to initiate pumping with her.  Mother has 2 support people present and does not desire to begin now.  Acknowledged her wishes and advised her to call me as soon as it was convenient for her to initiate the pumping.  Explained why early pumping is beneficial for baby.  Mother verbalized understanding and will call me when ready.  RN updated and will call me when mother is ready to begin pumping.    "Providing Breast Milk For Your Baby in the NICU" booklet and [place] pamphlet left at bedside; will review in more detail when mother desires my return visit.   Maternal Data    Feeding    LATCH Score                   Interventions    Lactation Tools Discussed/Used     Consult Status Consult Status: Follow-up Date: 06/22/19 Follow-up type: In-patient    Iyannah Blake R Abner Ardis 06/22/2019, 11:24 AM

## 2019-06-22 NOTE — Progress Notes (Signed)
Pt. OOB without difficulty. Foley discontinued. IV converted to saline lock. OB assessment WNL. Toya Smothers, RN

## 2019-06-22 NOTE — Op Note (Signed)
Operative Note   SURGERY DATE: 06/22/2019  PRE-OP DIAGNOSIS:  *Pregnancy at 28 weeks *Cord prolapse *Pre-term labor  POST-OP DIAGNOSIS:  *Pregnancy at 28 weeks *Cord prolapse *Pre-term labor   PROCEDURE: primary low transverse cesarean section via pfannenstiel skin incision with double layer uterine closure  SURGEON: Surgeon(s) and Role:    * Woodroe Mode, MD - Primary    * Marra Fraga, DO- OB Fellow  ASSISTANT: None  ANESTHESIA: general  ESTIMATED BLOOD LOSS: 550 mL  DRAINS: 275 mL UOP via indwelling foley  TOTAL IV FLUIDS: 1800 mL crystalloid  VTE PROPHYLAXIS: SCDs to bilateral lower extremities  ANTIBIOTICS: None, patient to have 2 g Ancef in PACU  SPECIMENS: Placenta to pathology, Cord Gas obtained but unable to use due to clotting  COMPLICATIONS: None  INDICATIONS: Code Cesarean called for cord prolapse upon SROM  FINDINGS: No intra-abdominal adhesions were noted. Grossly normal uterus, tubes and ovaries. Clear amniotic fluid, cephalic female infant, weight 1050 gm, APGARs 5/8, intact placenta.  PROCEDURE IN DETAIL: The patient was taken to the operating room emergently and splash prepped with betadine in the dorsal supine position with a leftward tilt.  After general anesthesia was performed and the patient was intubated, a pfannensteil skin incision was made with the scalpel and carried through to the underlying layer of fascia. The fascia was then incised at the midline and this incision was extended laterally bluntly. Attention was turned to the superior aspect of the fascial incision which was grasped manually, tented up and the rectus muscles were dissected off bluntly. The rectus muscles were then separated in the midline and the peritoneum was entered bluntly. The bladder blade was inserted and the vesicouterine peritoneum was identified  A low transverse hysterotomy was made with the scalpel until the endometrial cavity was breached and the amniotic sac  ruptured, yielding clear amniotic fluid. This incision was extended bluntly and the infant was delivered through breech extraction due to the presenting part being a foot through the hysterotomy.The cord was clamped x 2 and cut, and the infant was handed to the awaiting pediatricians immediately. Cord gas was drawn.  The placenta was then gradually expressed from the uterus and then the uterus was cleared of all clots and debris. A Liletta IUD was placed in the uterus prior to closing the hysterotomy. The hysterotomy was repaired with a running suture of 0 Monocryl. A second imbricating layer of 0 Monocryl suture was then placed. Electrocautery and Arista were added to achieve excellent hemostasis. 1 g TXA was given due to some mild oozing at the operative sites.  The hysterotomy and all operative sites were reinspected and excellent hemostasis was noted.  The peritoneum was closed with a running stitch of 2-0 Vicryl. The fascia was reapproximated with 0 Vicryl in a simple running fashion bilaterally. The subcutaneous layer was then reapproximated with a running suture of 2-0 plain gut, and the skin was then closed with 4-0 Vicryl, in a subcuticular fashion.  The patient  tolerated the procedure well. Sponge, lap, needle, and instrument counts were correct x 2. The patient was transferred to the recovery room awake, alert and breathing independently in stable condition.  Merilyn Baba, DO OB Fellow [facility] for [employer] farm manager)

## 2019-06-23 ENCOUNTER — Encounter (HOSPITAL_COMMUNITY): Payer: Self-pay | Admitting: Obstetrics & Gynecology

## 2019-06-23 DIAGNOSIS — O10013 Pre-existing essential hypertension complicating pregnancy, third trimester: Secondary | ICD-10-CM

## 2019-06-23 DIAGNOSIS — O358XX0 Maternal care for other (suspected) fetal abnormality and damage, not applicable or unspecified: Secondary | ICD-10-CM

## 2019-06-23 DIAGNOSIS — Z3A28 28 weeks gestation of pregnancy: Secondary | ICD-10-CM

## 2019-06-23 LAB — SURGICAL PATHOLOGY

## 2019-06-23 MED ORDER — NIFEDIPINE ER OSMOTIC RELEASE 30 MG PO TB24
30.0000 mg | ORAL_TABLET | Freq: Every day | ORAL | Status: DC
  Administered 2019-06-23: 30 mg via ORAL
  Filled 2019-06-23: qty 1

## 2019-06-23 MED ORDER — VALACYCLOVIR HCL 500 MG PO TABS
1000.0000 mg | ORAL_TABLET | Freq: Two times a day (BID) | ORAL | Status: AC
  Administered 2019-06-23 (×2): 1000 mg via ORAL
  Filled 2019-06-23 (×2): qty 2

## 2019-06-23 NOTE — Lactation Note (Signed)
This note was copied from a baby's chart. Lactation Consultation Note  Patient Name: Margaret Ingram [id number] Date: 06/23/2019    [employer] [facility] student walked in to [place] about to eat a meal. She stated that she is in quite a lot of gas pain. She is still interested in pumping, but is not quite ready. Plans to call for lactation around 12pm to get started.       Margaret Ingram 06/23/2019, 9:50 AM

## 2019-06-23 NOTE — Progress Notes (Signed)
.   Subjective: Postpartum Day 1: Cesarean Delivery Patient reports incisional pain, tolerating PO and no problems voiding.  She has been ambulating, bleeding appropriate, no nausea or vomiting.  No flatus yet.  Not yet expressing breastmilk.  Bleeding appropriate.  Objective: Vital signs in last 24 hours: Temp:  [97.6 F (36.4 C)-98.3 F (36.8 C)] 98.3 F (36.8 C) (05/04 0429) Pulse Rate:  [73-91] 91 (05/04 0429) Resp:  [16-20] 18 (05/04 0429) BP: (124-149)/(56-103) 148/77 (05/04 0429) SpO2:  [92 %-100 %] 94 % (05/04 0429) Weight:  [114.8 kg] 114.8 kg (05/03 1020)  Physical Exam:  General: alert, cooperative and no distress Lochia: appropriate Uterine Fundus: firm Incision: dressing dry DVT Evaluation: No evidence of DVT seen on physical exam. No cords or calf tenderness.  Recent Labs    06/20/19 1246 06/22/19 0748  HGB 11.6* 11.0*  HCT 36.4 34.0*    Assessment/Plan: Status post Cesarean section. Doing well postoperatively.  Continue current care. HTN: start procardia XL 30mg  daily (was on labetalol BID during pregnancy prior to admission, but agreeable to change for once daily dosing). Sore throat: cepacol ordered encouraged to use. Pain: patient has oxycodone ordered, encouraged to ask for if needed Cold sore: valtrex ordered for treatment, symptoms just started Anticipate discharge home POD2/3 depending on pressures and bowels.  Debbrah Alar, MD 06/23/2019, 7:12 AM

## 2019-06-23 NOTE — Progress Notes (Signed)
CSW completed chart review and attempted to meet with MOB to complete psychosocial assessment, however MOB was asleep. CSW will attempt to meet with MOB at a later time.   Abundio Miu, [place] Worker [facility] Cell#: [phone]

## 2019-06-24 LAB — RPR: RPR Ser Ql: NONREACTIVE

## 2019-06-24 MED ORDER — BISACODYL 10 MG RE SUPP: 10.0000 mg | Freq: Every day | RECTAL | Status: DC | PRN

## 2019-06-24 MED ORDER — NIFEDIPINE ER OSMOTIC RELEASE 30 MG PO TB24
60.0000 mg | ORAL_TABLET | Freq: Every day | ORAL | Status: DC
  Administered 2019-06-24 – 2019-06-26 (×3): 60 mg via ORAL
  Filled 2019-06-24 (×3): qty 2

## 2019-06-24 MED ORDER — POLYETHYLENE GLYCOL 3350 17 G PO PACK
17.0000 g | PACK | Freq: Every day | ORAL | Status: DC | PRN
  Filled 2019-06-24: qty 1

## 2019-06-24 NOTE — Lactation Note (Signed)
This note was copied from a baby's chart. Lactation Consultation Note  Patient Name: Boy Sherrae Lehrer [id number] Date: 06/24/2019    [facility] Follow Up Visit:  P4 mother whose infant is now 67 hours old.  This is a preterm baby at 28+2 weeks with a CGA of 16+17 weeks old weighing <3 lbs and in the NICU.  I saw mother when her son was only 43 hours old and we discussed pumping at that time.  Since visiting with her she has only pumped once and that was yesterday.  Reiterated the importance of pumping at least every three hours and offered to observe/assist with pumping now.  Mother agreeable.  #27 flange size is appropriate at this time.  Discussed observing her nipple size and asking for a larger flange if needed.  Reviewed pump parts, assembly, disassembly and cleaning.  At the end of the 15 minutes mother was able to obtain one large drop of colostrum which she used on her nipple/areola for comfort.  Encouraged hand expression before/after pumping to help with milk supply.  Colostrum containers provided and milk storage times reviewed.  Mother will obtain labels from the NICU when she visits today.  She will bring any EBM she obtains to the NICU.  Informed her of the option to pump at baby's bedside and showed her how to transport her pump parts.  She will pump again at 1300 and at least every three hours thereafter.    Grandmother and father present and supportive.  Mother has a DEBP for home use but is not sure if she has all her pump parts.  She is a [facility] participant at the [employer] office and [facility] referral faxed.  Mother will follow up this morning with a phone call to their office to determine pump eligibility.  RN updated.                   Rodderick Holtzer R Cheikh Bramble 06/24/2019, 10:23 AM

## 2019-06-24 NOTE — Progress Notes (Signed)
CSW placed 4 meal vouchers,a 31 day bus pass and contact information for Medicaid transportation at infant's bedside, MOB present and thanked CSW.   CSW will continue to offer resources/supports while infant is admitted to the NICU.   Abundio Miu, [place] Worker [facility] Cell#: [phone]

## 2019-06-24 NOTE — Progress Notes (Signed)
Dr. Rip Harbour notified of SRBP's of 167/92 and 162/82.  No further orders receiveded.  Will continue to monitor.

## 2019-06-24 NOTE — Progress Notes (Signed)
Postpartum Day 2: Cesarean Delivery for cord prolapse, PTL at [redacted]w[redacted]d; patient with GHTN Subjective: Patient reports mild incisional pain, tolerating PO and no problems voiding.  She has been ambulating, bleeding appropriate, no nausea or vomiting.  A lot of flatus reported, but no bowel movement, feels very constipated.  Bleeding appropriate. Breastfeeding.  Baby ([place]) stable in NICU  Objective: Vital signs in last 24 hours: Temp:  [97.8 F (36.6 C)-98.3 F (36.8 C)] 98 F (36.7 C) (05/05 0627) Pulse Rate:  [84-103] 101 (05/05 0627) Resp:  [18-20] 20 (05/05 0627) BP: (135-153)/(80-106) 153/80 (05/05 0627) SpO2:  [98 %-99 %] 99 % (05/05 0627) Patient Vitals for the past 24 hrs:  BP Temp Temp src Pulse Resp SpO2  06/24/19 0627 (!) 153/80 98 F (36.7 C) Oral (!) 101 20 99 %  06/23/19 2333 (!) 149/94 98 F (36.7 C) -- 98 20 99 %  06/23/19 1708 (!) 146/96 -- -- 84 -- 98 %  06/23/19 1640 (!) 142/106 98.3 F (36.8 C) Oral (!) 103 18 98 %  06/23/19 1417 135/83 97.8 F (36.6 C) Oral 89 18 99 %  06/23/19 0737 (!) 153/91 98.2 F (36.8 C) Oral 98 19 99 %    Physical Exam:  General: alert, cooperative and no distress Lochia: appropriate Uterine Fundus: firm Incision: Dressing and steristrips on incision.  DVT Evaluation: No evidence of DVT seen on physical exam. No cords or calf tenderness.  Labs: CBC Latest Ref Rng & Units 06/22/2019 06/20/2019 09/20/2018  WBC 4.0 - 10.5 K/uL 9.7 7.2 9.3  Hemoglobin 12.0 - 15.0 g/dL 11.0(L) 11.6(L) 12.6  Hematocrit 36.0 - 46.0 % 34.0(L) 36.4 39.0  Platelets 150 - 400 K/uL 133(L) 138(L) 169   CMP Latest Ref Rng & Units 06/22/2019 06/20/2019 09/20/2018  Glucose 70 - 99 mg/dL 102(H) 89 106(H)  BUN 6 - 20 mg/dL 5(L) 6 12  Creatinine 0.44 - 1.00 mg/dL 0.55 0.47 0.99  Sodium 135 - 145 mmol/L 136 136 136  Potassium 3.5 - 5.1 mmol/L 3.9 4.0 3.0(L)  Chloride 98 - 111 mmol/L 109 105 103  CO2 22 - 32 mmol/L 21(L) 20(L) 20(L)  Calcium 8.9 - 10.3 mg/dL 8.3(L) 8.9  8.7(L)  Total Protein 6.5 - 8.1 g/dL 5.9(L) 6.8 7.6  Total Bilirubin 0.3 - 1.2 mg/dL 0.2(L) 0.5 0.7  Alkaline Phos 38 - 126 U/L 59 64 98  AST 15 - 41 U/L 21 15 15   ALT 0 - 44 U/L 13 13 16     Assessment/Plan: Status post Cesarean section. Doing well postoperatively.  HTN: Increased Procardia XL to 60mg  daily (was on Labetalol BID during pregnancy prior to admission). Continue close observation. No signs/symptoms of PEC or severe features.   Constipation: Miralax ordered, also ordered Dulcolax as needed. Already on Senakot. Adequate hydration recommended. Pain: Oral analgesia as needed Continue current postpartum care, will follow up with OB provider at [facility], appointment has already been made. She also plans for IUD for postpartum contraception. Anticipate discharge home POD3 if BP is managed adequately and patient is stable.   Verita Schneiders, MD 06/24/2019, 7:11 AM

## 2019-06-24 NOTE — Clinical Social Work Maternal (Signed)
CLINICAL SOCIAL WORK MATERNAL/CHILD NOTE  Patient Details  Name: Margaret Ingram MRN: [id number] Date of Birth: 1995/05/05  Date:  06/24/2019  Clinical Social Worker Initiating Note:  Abundio Miu, [place] Date/Time: Initiated:  06/24/19/1106     Child's Name:  Margaret Ingram   Biological Parents:  Mother, Father(Father: Margaret Ingram)   Need for Interpreter:  None   Reason for Referral:  Parental Support of Premature Babies < 67 weeks/or Critically Ill babies, Behavioral Health Concerns   Address:  [address] [place] [address]    Phone number:  [phone]  Additional phone number:   Household Members/Support Persons (HM/SP):   Household Member/Support Person 1, Household Member/Support Person 2, Household Member/Support Person 3, Household Member/Support Person 4   HM/SP Name Relationship DOB or Age  HM/SP -1 Margaret Ingram FOB    HM/SP -2 Margaret Ingram son 38 years old  HM/SP -3 Margaret Ingram son 62 year old  HM/SP -[name] daughter 31 months old  HM/SP -5        HM/SP -6        HM/SP -7        HM/SP -8          Natural Supports (not living in the home):  Parent   Professional Supports: Case Manager/Social Psychologist, occupational: Teacher, music)   Employment: Unemployed   Type of Work:     Education:  Programmer, systems   Homebound arranged:    Museum/gallery curator Resources:  [employer]   Other Resources:  [employer], Physicist, medical    Cultural/Religious Considerations Which May Impact Care:    Strengths:  Ability to meet basic needs , Engineer, materials, Understanding of illness   Psychotropic Medications:         Pediatrician:    Careers adviser area  Pediatrician List:   [place])  [place]      Pediatrician Fax Number:    Risk Factors/Current Problems:  None   Cognitive State:  Able to Concentrate , Alert , Insightful , Goal  Oriented , Linear Thinking    Mood/Affect:  Calm , Happy , Interested , Comfortable    CSW Assessment: CSW met with MOB at bedside to discuss consult for NICU admission and behavioral health concerns, MOB's stepmother present. CSW introduced self and explained reason for consult "NICU admission". MOB was welcoming, pleasant and engaged during assessment. MOB's stepmother was pleasant and also participated in assessment. MOB reported that she resides with FOB and three older children. MOB reported that she is unemployed and receives [facility] and food stamps. MOB reported that she has a Product/process development scientist from the [place] office named Crystal. MOB was unable to recall the name of the program her caseworker is with but reports that her caseworker meets with her at doctors appointments and does home visits. MOB reported that her caseworker is a good support and she can speak with her about anything. MOB reported that she has not started to shop for infant and will need help obtaining items. CSW informed MOB about [facility] [employer], MOB and MOB's stepmother reported that she needs a car seat, pack and play, clothes, diapers, wipes, and hygiene items. CSW agreed to make a referral for needed items and informed MOB about the hospital car seat program. MOB reported that she is able to pay $30 for a car seat.  MOB's father entered the room, MOB introduced CSW to her father. CSW inquired about MOB's support system, MOB reported that her parents (stepmother and father) are her supports.   CSW and MOB discussed infant's NICU admission. MOB reported that she feels well informed about infant's care and went into detail about infant's care. CSW informed MOB about the NICU, what to expect and resources/supports available while infant is admitted to the NICU. MOB reported that she will have transportation barriers. CSW informed MOB about Medicaid transportation and asked would a 31 day bus pass be  helpful, MOB reported yes. CSW agreed to leave a 31 day bus pass at infant's bedside. MOB reported that meal vouchers would be helpful, CSW agreed to leave meal vouchers at infant's bedside. MOB denied any additional questions/concerns regarding the NICU. CSW informed MOB that the NICU is a journey with ups and downs that NICU staff is available for support as needed.   CSW asked parents to step out of the room to speak with MOB privately, parents left the room.   CSW inquired about MOB's mental health history, MOB reported that she experienced depression with her first pregnancy in 2017. MOB reported that she started medication which was helpful then she discontinued the medication. MOB shared that she experienced postpartum depression after each of her pregnancies. MOB reported that with her last child the symptoms started right after she gave birth. MOB described her symptoms as crying, feeling sad and isolating. MOB reported that her symptoms subsided after taking medication and that she is not currently having any depressive symptoms or taking medication. CSW inquired about how MOB was feeling emotionally after giving birth, MOB reported that she felt "wishy washy". CSW asked MOB to explain what she meant by that phrase. MOB reported that she has been worrying about infant and blaming herself for infant's condition. MOB spoke at length about her health during pregnancy. CSW acknowledged MOB's feelings and encouraged MOB to refocus her self blaming on more positive thoughts. CSW encouraged MOB to catch, challenge and change her thoughts surrounding self blame. CSW encouraged MOB to focus on the now and what she can do to be the best mother for her children. MOB was receptive to discussion. CSW asked MOB to keep a close eye on postpartum depression signs/symptoms as her edinburgh score was 18. MOB and CSW discussed edinburgh score 18. CSW encouraged MOB to follow up with her OBGYN regarding postpartum  depression signs/symptoms if they arise, MOB verbalized understanding. MOB presented calm and did not demonstrate any acute mental health signs/symptoms. MOB was open when discussing mental health history. CSW assessed for safety, MOB denied SI, HI and domestic violence.   CSW provided education regarding the baby blues period vs. perinatal mood disorders, discussed treatment and gave resources for mental health follow up if concerns arise.  CSW recommends self-evaluation during the postpartum time period using the New Mom Checklist from Postpartum Progress and encouraged MOB to contact a medical professional if symptoms are noted at any time.    CSW provided review of Sudden Infant Death Syndrome (SIDS) precautions.    CSW informed MOB that infant qualifies to apply for SSI benefits. MOB reported that she was interested. CSW informed MOB about SSI benefits and the application process. CSW provided MOB with paperwork regarding SSI benefits. CSW inquired about any additional needs/concerns. MOB reported none.   CSW will continue to offer resources/supports while infant is admitted to the NICU.   CSW Plan/Description:  Sudden Infant Death  Syndrome (SIDS) Education, Perinatal Mood and Anxiety Disorder (PMADs) Education, Theatre stage manager Income (SSI) Information, Other Patient/Family Education, Other Information/Referral to [employer], [place] 06/24/2019, 11:13 AM

## 2019-06-25 ENCOUNTER — Encounter (HOSPITAL_COMMUNITY): Payer: Self-pay | Admitting: Obstetrics & Gynecology

## 2019-06-25 MED ORDER — HYDROCHLOROTHIAZIDE 25 MG PO TABS
25.0000 mg | ORAL_TABLET | Freq: Every day | ORAL | Status: DC
  Administered 2019-06-25 – 2019-06-26 (×2): 25 mg via ORAL
  Filled 2019-06-25 (×2): qty 1

## 2019-06-25 NOTE — Progress Notes (Signed)
Subjective: Postpartum Day 4: Cesarean Delivery Patient reports feeling well without headache, visual changes, RUQ/epigastric pain, nausea or emesis.    Objective: Vital signs in last 24 hours: Temp:  [97.7 F (36.5 C)-99 F (37.2 C)] 98 F (36.7 C) (05/06 0412) Pulse Rate:  [89-100] 89 (05/06 0412) Resp:  [17-18] 18 (05/06 0412) BP: (145-167)/(75-95) 145/86 (05/06 0412) SpO2:  [94 %-100 %] 100 % (05/05 2256)  Physical Exam:  General: alert, cooperative and no distress Lochia: appropriate Uterine Fundus: firm Incision: honeycomb dressing intact DVT Evaluation: No evidence of DVT seen on physical exam.  No results for input(s): HGB, HCT in the last 72 hours.  Assessment/Plan: Status post Cesarean section. Doing well postoperatively.  Patient with persistent HTN on procardia 60 BID. Will add HCTZ 25 mg daily Discharge planning tomorrow if remain stable.  Margaret Ingram 06/25/2019, 8:44 AM

## 2019-06-26 ENCOUNTER — Encounter: Payer: Self-pay | Admitting: *Deleted

## 2019-06-26 MED ORDER — OXYCODONE HCL 5 MG PO TABS: 5.0000 mg | ORAL_TABLET | ORAL | 0 refills | Status: DC | PRN

## 2019-06-26 MED ORDER — HYDROCHLOROTHIAZIDE 25 MG PO TABS: 25.0000 mg | ORAL_TABLET | Freq: Every day | ORAL | 2 refills | Status: DC

## 2019-06-26 MED ORDER — NIFEDIPINE ER 60 MG PO TB24: 60.0000 mg | ORAL_TABLET | Freq: Every day | ORAL | 2 refills | Status: DC

## 2019-06-26 MED ORDER — IBUPROFEN 800 MG PO TABS: 800.0000 mg | ORAL_TABLET | Freq: Four times a day (QID) | ORAL | 0 refills | Status: DC

## 2019-06-26 MED ORDER — ADULT BLOOD PRESSURE CUFF LG KIT: 1.0000 | PACK | Freq: Every day | 0 refills | Status: DC

## 2019-06-26 MED FILL — NIFEdipine ER 60 MG TB24: 60 | 30 days supply | Qty: 30 | Fill #0

## 2019-06-26 MED FILL — IBUPROFEN 800 MG TAB: 800 | 8 days supply | Qty: 30 | Fill #0

## 2019-06-26 MED FILL — oxyCODONE HCL 5 MG TABS: 5 | 3 days supply | Qty: 15 | Fill #0

## 2019-06-26 MED FILL — HYDROCHLOROTHIAZIDE 25 MG T: 25 | 30 days supply | Qty: 30 | Fill #0

## 2019-06-26 NOTE — Lactation Note (Signed)
This note was copied from a baby's chart. Lactation Consultation Note  Patient Name: Margaret Ingram [id number] Date: 06/26/2019 Reason for consult: NICU baby  Infant is 98 days old. Mom was seen in her room (room 114).  Mom is pumping q3hrs & most recently obtained 4 oz (Mom began pumping when infant was 88 days old). She will be going to the [facility] office later today to pick up her DEBP.  Mom knows about cleaning her breast pump parts after use. I also spoke with her about sanitizing her pump parts (except for the tubing) once/day.    Mom has no breast complaints & is doing well.  Matthias Hughs [facility] 06/26/2019, 10:20 AM

## 2019-06-26 NOTE — Lactation Note (Addendum)
This note was copied from a baby's chart. Lactation Consultation Note  Patient Name: Margaret Ingram [id number] Date: 06/26/2019   [facility] visit attempted in mom's room (Room 114), but Mom & support person sleeping.   [facility] referral sent by previous LC was successfully transmitted.  Maternal d/c meds noted to include: HCTZ 25 mg qd (L2) & nifedipine 60 mg qd (L2).  Matthias Hughs [facility] 06/26/2019, 8:05 AM

## 2019-06-26 NOTE — OR Nursing (Signed)
Talked with V Menssah circulator RN wrong sponge was charged  For in supplies  Gauze was deleted and noted in site dressing what was applied   Honeycomb dressing with pressure dressing was applied  Patient was scanned and no internal packing was noted.  MD notified.

## 2019-06-26 NOTE — Progress Notes (Addendum)
Sponge scanner completed and patient is free of sponges-  Scanner all clear ID: [id number]

## 2019-07-22 NOTE — Progress Notes (Signed)
Patient Margaret Ingram had a reactive NST on 06-20-2019 in the Maternity Admission Unit.

## 2019-08-12 ENCOUNTER — Other Ambulatory Visit: Payer: Self-pay

## 2019-08-12 ENCOUNTER — Inpatient Hospital Stay (HOSPITAL_COMMUNITY)
Admission: AD | Admit: 2019-08-12 | Discharge: 2019-08-12 | Disposition: A | Discharge status: Home / Self Care | Payer: Medicaid Other | Attending: Obstetrics and Gynecology | Admitting: Obstetrics and Gynecology

## 2019-08-12 DIAGNOSIS — Z30431 Encounter for routine checking of intrauterine contraceptive device: Secondary | ICD-10-CM

## 2019-08-12 NOTE — MAU Provider Note (Signed)
    GYNECOLOGY OFFICE ENCOUNTER NOTE  History:  24 y.o. [id number] here today for today for IUD string check; Liletta  IUD was placed during emergency c-section on 06/22/19. Patient PP, here in at [facility] visiting her baby in NICU. Noticed IUD strings hanging out of vagina.     Review of Systems:  Pertinent items are noted in HPI.  Objective:  Physical Exam Blood pressure 140/78, pulse 84, temperature 98.1 F (36.7 C), temperature source Oral, resp. rate 18, weight 114.7 kg, last menstrual period 08/05/2019, SpO2 100 %, not currently breastfeeding. CONSTITUTIONAL: Well-developed, well-nourished female in no acute distress.  HENT:  Normocephalic, atraumatic. External right and left ear normal. Oropharynx is clear and moist EYES: Conjunctivae and EOM are normal. Pupils are equal, round, and reactive to light. No scleral icterus.  NECK: Normal range of motion, supple, no masses CARDIOVASCULAR: Normal heart rate noted RESPIRATORY: Effort and breath sounds normal, no problems with respiration noted ABDOMEN: Soft, no distention noted.   PELVIC: Normal appearing external genitalia; normal appearing vaginal mucosa and cervix.  IUD strings visualized outside of vagina. IUD strings trimmed to 2-3 cm.   Assessment & Plan:  Patient to keep IUD in place for up to seven years; can come in for removal if she desires pregnancy earlier or for any concerning side effects.  IUD strings trimmed to 2-3 cm.  Patient tolerated exam well.    Jalynn Waddell, Artist Pais, [place] for [employer], [place]

## 2019-08-12 NOTE — Discharge Instructions (Signed)
Levonorgestrel intrauterine device (IUD) What is this medicine? LEVONORGESTREL IUD (LEE voe nor jes trel) is a contraceptive (birth control) device. The device is placed inside the uterus by a healthcare professional. It is used to prevent pregnancy. This device can also be used to treat heavy bleeding that occurs during your period. This medicine may be used for other purposes; ask your health care provider or pharmacist if you have questions. COMMON BRAND NAME(S): Kyleena, LILETTA, Mirena, Skyla What should I tell my health care provider before I take this medicine? They need to know if you have any of these conditions:  abnormal Pap smear  cancer of the breast, uterus, or cervix  diabetes  endometritis  genital or pelvic infection now or in the past  have more than one sexual partner or your partner has more than one partner  heart disease  history of an ectopic or tubal pregnancy  immune system problems  IUD in place  liver disease or tumor  problems with blood clots or take blood-thinners  seizures  use intravenous drugs  uterus of unusual shape  vaginal bleeding that has not been explained  an unusual or allergic reaction to levonorgestrel, other hormones, silicone, or polyethylene, medicines, foods, dyes, or preservatives  pregnant or trying to get pregnant  breast-feeding How should I use this medicine? This device is placed inside the uterus by a health care professional. Talk to your pediatrician regarding the use of this medicine in children. Special care may be needed. Overdosage: If you think you have taken too much of this medicine contact a poison control center or emergency room at once. NOTE: This medicine is only for you. Do not share this medicine with others. What if I miss a dose? This does not apply. Depending on the brand of device you have inserted, the device will need to be replaced every 3 to 6 years if you wish to continue using this type  of birth control. What may interact with this medicine? Do not take this medicine with any of the following medications:  amprenavir  bosentan  fosamprenavir This medicine may also interact with the following medications:  aprepitant  armodafinil  barbiturate medicines for inducing sleep or treating seizures  bexarotene  boceprevir  griseofulvin  medicines to treat seizures like carbamazepine, ethotoin, felbamate, oxcarbazepine, phenytoin, topiramate  modafinil  pioglitazone  rifabutin  rifampin  rifapentine  some medicines to treat HIV infection like atazanavir, efavirenz, indinavir, lopinavir, nelfinavir, tipranavir, ritonavir  St. John's wort  warfarin This list may not describe all possible interactions. Give your health care provider a list of all the medicines, herbs, non-prescription drugs, or dietary supplements you use. Also tell them if you smoke, drink alcohol, or use illegal drugs. Some items may interact with your medicine. What should I watch for while using this medicine? Visit your doctor or health care professional for regular check ups. See your doctor if you or your partner has sexual contact with others, becomes HIV positive, or gets a sexual transmitted disease. This product does not protect you against HIV infection (AIDS) or other sexually transmitted diseases. You can check the placement of the IUD yourself by reaching up to the top of your vagina with clean fingers to feel the threads. Do not pull on the threads. It is a good habit to check placement after each menstrual period. Call your doctor right away if you feel more of the IUD than just the threads or if you cannot feel the threads at   all. The IUD may come out by itself. You may become pregnant if the device comes out. If you notice that the IUD has come out use a backup birth control method like condoms and call your health care provider. Using tampons will not change the position of the  IUD and are okay to use during your period. This IUD can be safely scanned with magnetic resonance imaging (MRI) only under specific conditions. Before you have an MRI, tell your healthcare provider that you have an IUD in place, and which type of IUD you have in place. What side effects may I notice from receiving this medicine? Side effects that you should report to your doctor or health care professional as soon as possible:  allergic reactions like skin rash, itching or hives, swelling of the face, lips, or tongue  fever, flu-like symptoms  genital sores  high blood pressure  no menstrual period for 6 weeks during use  pain, swelling, warmth in the leg  pelvic pain or tenderness  severe or sudden headache  signs of pregnancy  stomach cramping  sudden shortness of breath  trouble with balance, talking, or walking  unusual vaginal bleeding, discharge  yellowing of the eyes or skin Side effects that usually do not require medical attention (report to your doctor or health care professional if they continue or are bothersome):  acne  breast pain  change in sex drive or performance  changes in weight  cramping, dizziness, or faintness while the device is being inserted  headache  irregular menstrual bleeding within first 3 to 6 months of use  nausea This list may not describe all possible side effects. Call your doctor for medical advice about side effects. You may report side effects to FDA at 1-800-FDA-1088. Where should I keep my medicine? This does not apply. NOTE: This sheet is a summary. It may not cover all possible information. If you have questions about this medicine, talk to your doctor, pharmacist, or health care provider.  2020 Elsevier/Gold Standard (2017-12-17 13:22:01)  

## 2019-08-12 NOTE — MAU Note (Signed)
C/s on 5/3. (was 28wks)  IUD placed during c/s. During the night she used the bathroom, when she wiped, she felt the strings.  Freaked her out.  Started cramping about an hour ago

## 2019-08-17 ENCOUNTER — Ambulatory Visit (HOSPITAL_COMMUNITY)
Admission: EM | Admit: 2019-08-17 | Discharge: 2019-08-17 | Disposition: A | Discharge status: Home / Self Care | Payer: Medicaid Other

## 2019-08-17 ENCOUNTER — Other Ambulatory Visit: Payer: Self-pay

## 2019-08-17 NOTE — ED Notes (Signed)
Pt told pt access clerk that she was leaving 2/2 "plans".

## 2019-10-23 ENCOUNTER — Ambulatory Visit (HOSPITAL_COMMUNITY)
Admission: EM | Admit: 2019-10-23 | Discharge: 2019-10-23 | Disposition: A | Discharge status: Home / Self Care | Payer: Medicaid Other | Attending: Internal Medicine | Admitting: Internal Medicine

## 2019-10-23 ENCOUNTER — Other Ambulatory Visit: Payer: Self-pay

## 2019-10-23 ENCOUNTER — Encounter (HOSPITAL_COMMUNITY): Payer: Self-pay | Admitting: Emergency Medicine

## 2019-10-23 DIAGNOSIS — R059 Cough, unspecified: Secondary | ICD-10-CM

## 2019-10-23 DIAGNOSIS — R05 Cough: Secondary | ICD-10-CM | POA: Diagnosis not present

## 2019-10-23 DIAGNOSIS — Z20822 Contact with and (suspected) exposure to covid-19: Secondary | ICD-10-CM | POA: Diagnosis not present

## 2019-10-23 NOTE — ED Triage Notes (Signed)
Pt presents to [facility] for assessment after her youngest child was exposed to Caroline at his grandmothers.  She also took her [place] vaccine Saturday.  Co cough, headache, malaise.

## 2019-10-23 NOTE — ED Provider Notes (Signed)
[place]    CSN: [id number] Arrival date & time: 10/23/19  1649      History   Chief Complaint Chief Complaint  Patient presents with  . Cough    HPI Margaret Ingram is a 24 y.o. female.   Patient presents with 3-day history of nonproductive cough, headache, malaise, and diarrhea.  She received her first COVID vaccine on 8 Wendy 10/09/2019.  She reports she was exposed to a COVID positive person.  She requests a COVID test.  She denies fever, chills, rash, shortness of breath, vomiting, or other symptoms.  No treatments attempted at home.  The history is provided by the patient.    Past Medical History:  Diagnosis Date  . Anxiety   . Depression    with pregnancies  . Diabetes mellitus without complication ([place])    pre diabetic  . Fibroid   . Heart murmur   . Hypertension    chronic  . Infection    UTI  . Pregnancy induced hypertension   . Preterm contractions 06/12/2017  . Seizures ([place])    anxiety    Patient Active Problem List   Diagnosis Date Noted  . Pre-diabetes 06/22/2019  . Preterm labor in second trimester with preterm delivery in second trimester 06/22/2019  . Preterm labor with preterm delivery in second trimester   . 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 Hx of chlamydia infection 08/17/2018  . Hx of preeclampsia, prior pregnancy, currently pregnant, third trimester 08/17/2018  . Chronic hypertension 08/17/2018  . Depression 08/17/2018  . Vaginal bleeding in pregnancy, first trimester 03/26/2018    Past Surgical History:  Procedure Laterality Date  . CESAREAN SECTION N/A 06/22/2019   Procedure: CESAREAN SECTION;  Surgeon: Woodroe Mode, MD;  Location: [facility] LD ORS;  Service: Obstetrics;  Laterality: N/A;  STAT Cord prolapse Liletta  . NO PAST SURGERIES    . WISDOM TOOTH EXTRACTION      OB History    Gravida  4   Para  4   Term  2   Preterm  2   AB      Living  4     SAB      TAB      Ectopic       Multiple  0   Live Births  4            Home Medications    Prior to Admission medications   Medication Sig Start Date End Date Taking? Authorizing Provider  acetaminophen (TYLENOL) 325 MG tablet Take 650 mg by mouth every 6 (six) hours as needed for mild pain or headache.    [provider]  Blood Pressure Monitoring (ADULT BLOOD PRESSURE CUFF LG) KIT 1 kit by Does not apply route daily. Call or return to the hospital if your blood pressure is running higher than 160 for the top number or 110 for the bottom number.  Check your blood pressure if you experience a headache that does not resolve or changes in your vision or other severe symptoms. 06/26/19   Debbrah Alar, MD  ferrous sulfate 325 (65 FE) MG tablet Take 325 mg by mouth daily with breakfast.    [provider]  hydrochlorothiazide (HYDRODIURIL) 25 MG tablet Take 1 tablet (25 mg total) by mouth daily. 06/26/19   Debbrah Alar, MD  ibuprofen (ADVIL) 800 MG tablet Take 1 tablet (800 mg total) by mouth every 6 (six) hours. 06/26/19   Pezzuto, Mickel Baas,  MD  NIFEdipine (ADALAT CC) 60 MG 24 hr tablet Take 1 tablet (60 mg total) by mouth daily. 06/26/19   Pezzuto, Mickel Baas, MD  oxyCODONE (OXY IR/ROXICODONE) 5 MG immediate release tablet Take 1 tablet (5 mg total) by mouth every 4 (four) hours as needed for severe pain. 06/26/19   Debbrah Alar, MD  Prenatal Vit-Fe Fumarate-FA (PRENATAL PO) Take 1 tablet by mouth daily.    [provider]    Family History Family History  Problem Relation Age of Onset  . Diabetes Other   . Healthy Mother   . Healthy Father     Social History Social History   Tobacco Use  . Smoking status: Current Some Day Smoker    Types: Cigarettes  . Smokeless tobacco: Never Used  . Tobacco comment: occ  Vaping Use  . Vaping Use: Never used  Substance Use Topics  . Alcohol use: Not Currently    Comment: occasionally   . Drug use: Not Currently    Types: Marijuana    Comment: "not since  I got pregnant"     Allergies   Azithromycin and Other   Review of Systems Review of Systems  Constitutional: Positive for fatigue. Negative for chills and fever.  HENT: Negative for ear pain and sore throat.   Eyes: Negative for pain and visual disturbance.  Respiratory: Positive for cough. Negative for shortness of breath.   Cardiovascular: Negative for chest pain and palpitations.  Gastrointestinal: Positive for diarrhea. Negative for abdominal pain and vomiting.  Genitourinary: Negative for dysuria and hematuria.  Musculoskeletal: Negative for arthralgias and back pain.  Skin: Negative for color change and rash.  Neurological: Positive for headaches. Negative for seizures and syncope.  All other systems reviewed and are negative.    Physical Exam Triage Vital Signs ED Triage Vitals [10/23/19 1743]  Enc Vitals Group     BP (!) 145/107     Pulse Rate 91     Resp 18     Temp 98.7 F (37.1 C)     Temp Source Oral     SpO2 100 %     Weight      Height      Head Circumference      Peak Flow      Pain Score 7     Pain Loc      Pain Edu?      Excl. in [place]?    No data found.  Updated Vital Signs BP (!) 145/107 (BP Location: Right Arm)   Pulse 91   Temp 98.7 F (37.1 C) (Oral)   Resp 18   SpO2 100%   Visual Acuity Right Eye Distance:   Left Eye Distance:   Bilateral Distance:    Right Eye Near:   Left Eye Near:    Bilateral Near:     Physical Exam Vitals and nursing note reviewed.  Constitutional:      General: She is not in acute distress.    Appearance: She is well-developed. She is not ill-appearing.  HENT:     Head: Normocephalic and atraumatic.     Right Ear: Tympanic membrane normal.     Left Ear: Tympanic membrane normal.     Nose: Nose normal.     Mouth/Throat:     Mouth: Mucous membranes are moist.     Pharynx: Oropharynx is clear.  Eyes:     Conjunctiva/sclera: Conjunctivae normal.  Cardiovascular:     Rate and Rhythm: Normal rate and  regular  rhythm.     Heart sounds: No murmur heard.   Pulmonary:     Effort: Pulmonary effort is normal. No respiratory distress.     Breath sounds: Normal breath sounds.  Abdominal:     General: Bowel sounds are normal.     Palpations: Abdomen is soft.     Tenderness: There is no abdominal tenderness. There is no guarding or rebound.  Musculoskeletal:     Cervical back: Neck supple.  Skin:    General: Skin is warm and dry.     Findings: No rash.  Neurological:     General: No focal deficit present.     Mental Status: She is alert and oriented to person, place, and time.     Gait: Gait normal.  Psychiatric:        Mood and Affect: Mood normal.        Behavior: Behavior normal.      UC Treatments / Results  Labs (all labs ordered are listed, but only abnormal results are displayed) Labs Reviewed  SARS CORONAVIRUS 2 (TAT 6-24 HRS)    EKG   Radiology No results found.  Procedures Procedures (including critical care time)  Medications Ordered in UC Medications - No data to display  Initial Impression / Assessment and Plan / UC Course  I have reviewed the triage vital signs and the nursing notes.  Pertinent labs & imaging results that were available during my care of the patient were reviewed by me and considered in my medical decision making (see chart for details).   Cough, exposure to COVID-19.  Per patient request, PCR COVID pending.  Instructed her to take Tylenol as needed for discomfort.  Instructed her to self quarantine until the test result is back.  Instructed her to go to ED if she has acute worsening symptoms.  Patient agrees to plan of care.   Final Clinical Impressions(s) / UC Diagnoses   Final diagnoses:  Cough  Exposure to COVID-19 virus     Discharge Instructions     Your COVID test is pending.  You should self quarantine until the test result is back.    Take Tylenol as needed for fever or discomfort.  Rest and keep yourself hydrated.     Go to the emergency department if you develop acute worsening symptoms.        ED Prescriptions    None     PDMP not reviewed this encounter.   Sharion Balloon, NP 10/23/19 1820

## 2019-10-23 NOTE — ED Notes (Signed)
Patient able to ambulate independently  

## 2019-10-23 NOTE — Discharge Instructions (Addendum)
Your COVID test is pending.  You should self quarantine until the test result is back.    Take Tylenol as needed for fever or discomfort.  Rest and keep yourself hydrated.    Go to the emergency department if you develop acute worsening symptoms.     

## 2019-10-24 LAB — SARS CORONAVIRUS 2 (TAT 6-24 HRS): SARS Coronavirus 2: NEGATIVE

## 2019-12-05 ENCOUNTER — Encounter (HOSPITAL_COMMUNITY): Payer: Self-pay | Admitting: Emergency Medicine

## 2019-12-05 ENCOUNTER — Other Ambulatory Visit: Payer: Self-pay

## 2019-12-05 ENCOUNTER — Emergency Department (HOSPITAL_COMMUNITY)
Admission: EM | Admit: 2019-12-05 | Discharge: 2019-12-05 | Disposition: A | Discharge status: Home / Self Care | Payer: Medicaid Other | Attending: Emergency Medicine | Admitting: Emergency Medicine

## 2019-12-05 DIAGNOSIS — F419 Anxiety disorder, unspecified: Secondary | ICD-10-CM | POA: Insufficient documentation

## 2019-12-05 DIAGNOSIS — Z5321 Procedure and treatment not carried out due to patient leaving prior to being seen by health care provider: Secondary | ICD-10-CM | POA: Diagnosis not present

## 2019-12-05 LAB — COMPREHENSIVE METABOLIC PANEL
ALT: 23 U/L (ref 0–44)
AST: 20 U/L (ref 15–41)
Albumin: 4.2 g/dL (ref 3.5–5.0)
Alkaline Phosphatase: 76 U/L (ref 38–126)
Anion gap: 9 (ref 5–15)
BUN: 12 mg/dL (ref 6–20)
CO2: 24 mmol/L (ref 22–32)
Calcium: 9.1 mg/dL (ref 8.9–10.3)
Chloride: 104 mmol/L (ref 98–111)
Creatinine, Ser: 0.68 mg/dL (ref 0.44–1.00)
GFR, Estimated: >60 mL/min (ref >60)
Glucose, Bld: 107 mg/dL — ABNORMAL HIGH (ref 70–99)
Potassium: 3.5 mmol/L (ref 3.5–5.1)
Sodium: 137 mmol/L (ref 135–145)
Total Bilirubin: 0.3 mg/dL (ref 0.3–1.2)
Total Protein: 8.3 g/dL — ABNORMAL HIGH (ref 6.5–8.1)

## 2019-12-05 LAB — CBC
HCT: 35.6 % — ABNORMAL LOW (ref 36.0–46.0)
Hemoglobin: 11.6 g/dL — ABNORMAL LOW (ref 12.0–15.0)
MCH: 28.2 pg (ref 26.0–34.0)
MCHC: 32.6 g/dL (ref 30.0–36.0)
MCV: 86.4 fL (ref 80.0–100.0)
Platelets: 191 10*3/uL (ref 150–400)
RBC: 4.12 MIL/uL (ref 3.87–5.11)
RDW: 11.8 % (ref 11.5–15.5)
WBC: 5.4 10*3/uL (ref 4.0–10.5)
nRBC: 0 % (ref 0.0–0.2)

## 2019-12-05 LAB — SALICYLATE LEVEL: Salicylate Lvl: <7 mg/dL — ABNORMAL LOW (ref 7.0–30.0)

## 2019-12-05 LAB — ETHANOL: Alcohol, Ethyl (B): <10 mg/dL (ref <10)

## 2019-12-05 LAB — ACETAMINOPHEN LEVEL: Acetaminophen (Tylenol), Serum: <10 ug/mL — ABNORMAL LOW (ref 10–30)

## 2019-12-05 NOTE — ED Triage Notes (Signed)
Patient is complaining of anxiety attack. Patient son died on 12-05-2022. Patient has stop taken medication. Patient is very sad.

## 2019-12-07 ENCOUNTER — Emergency Department (HOSPITAL_COMMUNITY)
Admission: EM | Admit: 2019-12-07 | Discharge: 2019-12-07 | Disposition: A | Discharge status: Home / Self Care | Payer: Medicaid Other | Attending: Emergency Medicine | Admitting: Emergency Medicine

## 2019-12-07 ENCOUNTER — Encounter (HOSPITAL_COMMUNITY): Payer: Self-pay | Admitting: Emergency Medicine

## 2019-12-07 ENCOUNTER — Other Ambulatory Visit: Payer: Self-pay

## 2019-12-07 DIAGNOSIS — Z5321 Procedure and treatment not carried out due to patient leaving prior to being seen by health care provider: Secondary | ICD-10-CM | POA: Diagnosis not present

## 2019-12-07 DIAGNOSIS — R109 Unspecified abdominal pain: Secondary | ICD-10-CM | POA: Insufficient documentation

## 2019-12-07 DIAGNOSIS — R110 Nausea: Secondary | ICD-10-CM | POA: Insufficient documentation

## 2019-12-07 LAB — COMPREHENSIVE METABOLIC PANEL
ALT: 20 U/L (ref 0–44)
AST: 18 U/L (ref 15–41)
Albumin: 4.2 g/dL (ref 3.5–5.0)
Alkaline Phosphatase: 79 U/L (ref 38–126)
Anion gap: 11 (ref 5–15)
BUN: 12 mg/dL (ref 6–20)
CO2: 26 mmol/L (ref 22–32)
Calcium: 9.5 mg/dL (ref 8.9–10.3)
Chloride: 103 mmol/L (ref 98–111)
Creatinine, Ser: 0.78 mg/dL (ref 0.44–1.00)
GFR, Estimated: >60 mL/min (ref >60)
Glucose, Bld: 92 mg/dL (ref 70–99)
Potassium: 4.1 mmol/L (ref 3.5–5.1)
Sodium: 140 mmol/L (ref 135–145)
Total Bilirubin: 0.4 mg/dL (ref 0.3–1.2)
Total Protein: 8.1 g/dL (ref 6.5–8.1)

## 2019-12-07 LAB — CBC
HCT: 35.6 % — ABNORMAL LOW (ref 36.0–46.0)
Hemoglobin: 11.6 g/dL — ABNORMAL LOW (ref 12.0–15.0)
MCH: 28.4 pg (ref 26.0–34.0)
MCHC: 32.6 g/dL (ref 30.0–36.0)
MCV: 87 fL (ref 80.0–100.0)
Platelets: 204 10*3/uL (ref 150–400)
RBC: 4.09 MIL/uL (ref 3.87–5.11)
RDW: 11.7 % (ref 11.5–15.5)
WBC: 5.5 10*3/uL (ref 4.0–10.5)
nRBC: 0 % (ref 0.0–0.2)

## 2019-12-07 LAB — URINALYSIS, ROUTINE W REFLEX MICROSCOPIC
Bacteria, UA: NONE SEEN
Bilirubin Urine: NEGATIVE
Glucose, UA: NEGATIVE mg/dL
Hgb urine dipstick: NEGATIVE
Ketones, ur: NEGATIVE mg/dL
Leukocytes,Ua: NEGATIVE
Nitrite: NEGATIVE
Protein, ur: NEGATIVE mg/dL
Specific Gravity, Urine: 1.016 (ref 1.005–1.030)
pH: 7 (ref 5.0–8.0)

## 2019-12-07 LAB — I-STAT BETA HCG BLOOD, ED (MC, WL, AP ONLY): I-stat hCG, quantitative: <5 m[IU]/mL (ref <5)

## 2019-12-07 LAB — LIPASE, BLOOD: Lipase: 22 U/L (ref 11–51)

## 2019-12-07 MED ORDER — ONDANSETRON 4 MG PO TBDP: 4.0000 mg | ORAL_TABLET | Freq: Once | ORAL | Status: DC | PRN

## 2019-12-07 NOTE — ED Triage Notes (Signed)
Patient is complaining of abdominal pain. Patient states she might be pregnant. Patient states she was having pink spots. Patient is complaining of nausea.

## 2019-12-09 ENCOUNTER — Encounter (HOSPITAL_COMMUNITY): Payer: Self-pay | Admitting: Emergency Medicine

## 2019-12-09 ENCOUNTER — Other Ambulatory Visit: Payer: Self-pay

## 2019-12-09 ENCOUNTER — Ambulatory Visit (HOSPITAL_COMMUNITY)
Admission: EM | Admit: 2019-12-09 | Discharge: 2019-12-09 | Disposition: A | Discharge status: Home / Self Care | Payer: Medicaid Other | Attending: Family Medicine | Admitting: Family Medicine

## 2019-12-09 DIAGNOSIS — R102 Pelvic and perineal pain: Secondary | ICD-10-CM | POA: Insufficient documentation

## 2019-12-09 DIAGNOSIS — Z3202 Encounter for pregnancy test, result negative: Secondary | ICD-10-CM | POA: Diagnosis not present

## 2019-12-09 LAB — POCT URINALYSIS DIPSTICK, ED / UC
Bilirubin Urine: NEGATIVE
Glucose, UA: NEGATIVE mg/dL
Hgb urine dipstick: NEGATIVE
Ketones, ur: NEGATIVE mg/dL
Leukocytes,Ua: NEGATIVE
Nitrite: POSITIVE — AB
Protein, ur: NEGATIVE mg/dL
Specific Gravity, Urine: >=1.03 (ref 1.005–1.030)
Urobilinogen, UA: 1 mg/dL (ref 0.0–1.0)
pH: 6 (ref 5.0–8.0)

## 2019-12-09 LAB — POC URINE PREG, ED: Preg Test, Ur: NEGATIVE

## 2019-12-09 MED ORDER — NITROFURANTOIN MONOHYD MACRO 100 MG PO CAPS: 100.0000 mg | ORAL_CAPSULE | Freq: Two times a day (BID) | ORAL | 0 refills | Status: AC

## 2019-12-09 MED ORDER — NAPROXEN 500 MG PO TABS: 500.0000 mg | ORAL_TABLET | Freq: Two times a day (BID) | ORAL | 0 refills | Status: DC

## 2019-12-09 NOTE — ED Triage Notes (Signed)
Pt c/o possible pregnancy. She states she went to th hospital on 10/18 and had blood work and was unsure of what the results meant. She recently gave birth 4 months ago but her son passed away. She has had abd pain and "flutters in my stomach", her breast are tender and her back hurts. She also states she has been more tired than usual. Pt states she has an IUD currently but she has been spotting. She has not had a period for this month.

## 2019-12-09 NOTE — ED Provider Notes (Signed)
Margaret Ingram    CSN: [id number] Arrival date & time: 12/09/19  1731      History   Chief Complaint Chief Complaint  Patient presents with  . Possible Pregnancy    HPI Margaret Ingram is a 24 y.o. female.   Margaret Ingram presents with complaints of abdominal pain and concern and questioning if she is pregnant. States she went to [place] and had serum HCG. States her gyn office called her and said the result was 5, therefore unknown if this is an early pregnancy or early loss. IUD was placed in May and had been having regular periods. . Had a period in September but hasn't had one in October. Occasional spotting. Generalized abdominal pain and pelvic pain. Constant. Nausea. Pain x 1.5 week. Saw her gynecologist and had vaginal cytology testing done 1 week ago without acute findings. Cramping. No other vaginal discharge. Sexually active with 1 partner, no std concern, doesn't use condoms. No urinary symptoms. No vomiting. No diarrhea. No constipatoin. Currently: no active pain.    ROS per HPI, negative if not otherwise mentioned.      Past Medical History:  Diagnosis Date  . Anxiety   . Depression    with pregnancies  . Diabetes mellitus without complication ([place])    pre diabetic  . Fibroid   . Heart murmur   . Hypertension    chronic  . Infection    UTI  . Pregnancy induced hypertension   . Preterm contractions 06/12/2017  . Seizures ([place])    anxiety    Patient Active Problem List   Diagnosis Date Noted  . Pre-diabetes 06/22/2019  . Preterm labor in second trimester with preterm delivery in second trimester 06/22/2019  . Preterm labor with preterm delivery in second trimester   . 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 Hx of chlamydia infection 08/17/2018  . Hx of preeclampsia, prior pregnancy, currently pregnant, third trimester 08/17/2018  . Chronic hypertension 08/17/2018  . Depression 08/17/2018  . Vaginal bleeding in  pregnancy, first trimester 03/26/2018    Past Surgical History:  Procedure Laterality Date  . CESAREAN SECTION N/A 06/22/2019   Procedure: CESAREAN SECTION;  Surgeon: Woodroe Mode, MD;  Location: [facility] LD ORS;  Service: Obstetrics;  Laterality: N/A;  STAT Cord prolapse [place]  . NO PAST SURGERIES    . WISDOM TOOTH EXTRACTION      OB History    Gravida  4   Para  4   Term  2   Preterm  2   AB      Living  4     SAB      TAB      Ectopic      Multiple  0   Live Births  4            Home Medications    Prior to Admission medications   Medication Sig Start Date End Date Taking? Authorizing Provider  acetaminophen (TYLENOL) 325 MG tablet Take 650 mg by mouth every 6 (six) hours as needed for mild pain or headache.    [provider]  Blood Pressure Monitoring (ADULT BLOOD PRESSURE CUFF LG) KIT 1 kit by Does not apply route daily. Call or return to the hospital if your blood pressure is running higher than 160 for the top number or 110 for the bottom number.  Check your blood pressure if you experience a headache that does not resolve or changes in your  vision or other severe symptoms. 06/26/19   Debbrah Alar, MD  ferrous sulfate 325 (65 FE) MG tablet Take 325 mg by mouth daily with breakfast.    [provider]  hydrochlorothiazide (HYDRODIURIL) 25 MG tablet Take 1 tablet (25 mg total) by mouth daily. 06/26/19   Debbrah Alar, MD  ibuprofen (ADVIL) 800 MG tablet Take 1 tablet (800 mg total) by mouth every 6 (six) hours. 06/26/19   Pezzuto, Mickel Baas, MD  naproxen (NAPROSYN) 500 MG tablet Take 1 tablet (500 mg total) by mouth 2 (two) times daily. 12/09/19   Zigmund Gottron, NP  NIFEdipine (ADALAT CC) 60 MG 24 hr tablet Take 1 tablet (60 mg total) by mouth daily. 06/26/19   Pezzuto, Mickel Baas, MD  nitrofurantoin, macrocrystal-monohydrate, (MACROBID) 100 MG capsule Take 1 capsule (100 mg total) by mouth 2 (two) times daily for 5 days. 12/09/19 12/14/19  Augusto Gamble B, NP  oxyCODONE (OXY IR/ROXICODONE) 5 MG immediate release tablet Take 1 tablet (5 mg total) by mouth every 4 (four) hours as needed for severe pain. 06/26/19   Debbrah Alar, MD  Prenatal Vit-Fe Fumarate-FA (PRENATAL PO) Take 1 tablet by mouth daily.    [provider]    Family History Family History  Problem Relation Age of Onset  . Diabetes Other   . Healthy Mother   . Healthy Father     Social History Social History   Tobacco Use  . Smoking status: Current Some Day Smoker    Types: Cigarettes  . Smokeless tobacco: Never Used  . Tobacco comment: occ  Vaping Use  . Vaping Use: Never used  Substance Use Topics  . Alcohol use: Not Currently    Comment: occasionally   . Drug use: Not Currently    Types: Marijuana    Comment: "not since I got pregnant"     Allergies   Azithromycin and Other   Review of Systems Review of Systems   Physical Exam Triage Vital Signs ED Triage Vitals  Enc Vitals Group     BP 12/09/19 1835 (!) 150/97     Pulse Rate 12/09/19 1835 72     Resp 12/09/19 1835 20     Temp 12/09/19 1835 98.9 F (37.2 C)     Temp Source 12/09/19 1835 Oral     SpO2 12/09/19 1835 100 %     Weight --      Height --      Head Circumference --      Peak Flow --      Pain Score 12/09/19 1836 5     Pain Loc --      Pain Edu? --      Excl. in [place]? --    No data found.  Updated Vital Signs BP (!) 150/97 (BP Location: Left Arm)   Pulse 72   Temp 98.9 F (37.2 C) (Oral)   Resp 20   SpO2 100%   Visual Acuity Right Eye Distance:   Left Eye Distance:   Bilateral Distance:    Right Eye Near:   Left Eye Near:    Bilateral Near:     Physical Exam Constitutional:      General: She is not in acute distress.    Appearance: She is well-developed.  Cardiovascular:     Rate and Rhythm: Normal rate.  Pulmonary:     Effort: Pulmonary effort is normal.  Abdominal:     Tenderness: There is no abdominal tenderness. There is no right CVA  tenderness or left CVA tenderness.  Skin:    General: Skin is warm and dry.  Neurological:     Mental Status: She is alert and oriented to person, place, and time.      UC Treatments / Results  Labs (all labs ordered are listed, but only abnormal results are displayed) Labs Reviewed  POCT URINALYSIS DIPSTICK, ED / UC - Abnormal; Notable for the following components:      Result Value   Nitrite POSITIVE (*)    All other components within normal limits  URINE CULTURE  POC URINE PREG, ED  POC URINE PREG, ED    EKG   Radiology No results found.  Procedures Procedures (including critical care time)  Medications Ordered in UC Medications - No data to display  Initial Impression / Assessment and Plan / UC Course  I have reviewed the triage vital signs and the nursing notes.  Pertinent labs & imaging results that were available during my care of the patient were reviewed by me and considered in my medical decision making (see chart for details).     On chart review all documented pregnancy testing is negative, <5 with serum hcg on 10/18, and negative urine testing today. Nitrite to urine however, opted to treat with macrobid pending culture. Encouraged continued follow up with gynecology. Return precautions provided. Patient verbalized understanding and agreeable to plan.   Final Clinical Impressions(s) / UC Diagnoses   Final diagnoses:  Negative pregnancy test  Pelvic cramping     Discharge Instructions     Negative for pregnancy.  Your urine is concerning for UTI so I have started antibiotics and sent this to be cultured to confirm.  Naproxen twice a day as needed for pain. Take with food.  Please follow up with your gynecologist for recheck.  Return if any worsening of symptoms.     ED Prescriptions    Medication Sig Dispense Auth. Provider   nitrofurantoin, macrocrystal-monohydrate, (MACROBID) 100 MG capsule Take 1 capsule (100 mg total) by mouth 2 (two)  times daily for 5 days. 10 capsule Augusto Gamble B, NP   naproxen (NAPROSYN) 500 MG tablet Take 1 tablet (500 mg total) by mouth 2 (two) times daily. 30 tablet Zigmund Gottron, NP     PDMP not reviewed this encounter.   Zigmund Gottron, NP 12/09/19 1946

## 2019-12-09 NOTE — Discharge Instructions (Signed)
Negative for pregnancy.  Your urine is concerning for UTI so I have started antibiotics and sent this to be cultured to confirm.  Naproxen twice a day as needed for pain. Take with food.  Please follow up with your gynecologist for recheck.  Return if any worsening of symptoms.

## 2019-12-11 LAB — URINE CULTURE: Culture: 40000 — AB

## 2019-12-24 ENCOUNTER — Other Ambulatory Visit: Payer: Self-pay

## 2019-12-24 ENCOUNTER — Emergency Department (HOSPITAL_COMMUNITY)
Admission: EM | Admit: 2019-12-24 | Discharge: 2019-12-24 | Disposition: A | Discharge status: Home / Self Care | Payer: Medicaid Other | Attending: Emergency Medicine | Admitting: Emergency Medicine

## 2019-12-24 ENCOUNTER — Encounter (HOSPITAL_COMMUNITY): Payer: Self-pay | Admitting: Emergency Medicine

## 2019-12-24 ENCOUNTER — Emergency Department (HOSPITAL_COMMUNITY): Payer: Medicaid Other

## 2019-12-24 DIAGNOSIS — E119 Type 2 diabetes mellitus without complications: Secondary | ICD-10-CM | POA: Diagnosis not present

## 2019-12-24 DIAGNOSIS — F17210 Nicotine dependence, cigarettes, uncomplicated: Secondary | ICD-10-CM | POA: Insufficient documentation

## 2019-12-24 DIAGNOSIS — M25511 Pain in right shoulder: Secondary | ICD-10-CM | POA: Insufficient documentation

## 2019-12-24 DIAGNOSIS — G8929 Other chronic pain: Secondary | ICD-10-CM

## 2019-12-24 DIAGNOSIS — I10 Essential (primary) hypertension: Secondary | ICD-10-CM | POA: Diagnosis not present

## 2019-12-24 DIAGNOSIS — Z79899 Other long term (current) drug therapy: Secondary | ICD-10-CM | POA: Insufficient documentation

## 2019-12-24 MED ORDER — LIDOCAINE 5 % EX PTCH: 1.0000 | MEDICATED_PATCH | CUTANEOUS | 0 refills | Status: DC

## 2019-12-24 MED ORDER — NAPROXEN 500 MG PO TABS: 500.0000 mg | ORAL_TABLET | Freq: Two times a day (BID) | ORAL | 0 refills | Status: DC

## 2019-12-24 MED ORDER — KETOROLAC TROMETHAMINE 30 MG/ML IJ SOLN
30.0000 mg | Freq: Once | INTRAMUSCULAR | Status: AC
  Administered 2019-12-24: 30 mg via INTRAMUSCULAR
  Filled 2019-12-24: qty 1
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
## 2019-12-24 NOTE — ED Triage Notes (Signed)
Patient complaining of right shoulder pain that happened over a month ago.

## 2019-12-24 NOTE — Discharge Instructions (Signed)
Take Tylenol and ibuprofen as needed for pain.  I have written a prescription for lidocaine patches.  You placed to your right shoulder for 12 hours, remove after 12 hours.  You must be patch free for 12 hours before you can place additional patch.

## 2020-02-04 ENCOUNTER — Ambulatory Visit (HOSPITAL_COMMUNITY)
Admission: EM | Admit: 2020-02-04 | Discharge: 2020-02-04 | Disposition: A | Discharge status: Home / Self Care | Payer: Medicaid Other | Attending: Family Medicine | Admitting: Family Medicine

## 2020-02-04 ENCOUNTER — Other Ambulatory Visit: Payer: Self-pay

## 2020-02-04 ENCOUNTER — Encounter (HOSPITAL_COMMUNITY): Payer: Self-pay | Admitting: Emergency Medicine

## 2020-02-04 DIAGNOSIS — D509 Iron deficiency anemia, unspecified: Secondary | ICD-10-CM | POA: Diagnosis not present

## 2020-02-04 DIAGNOSIS — N3090 Cystitis, unspecified without hematuria: Secondary | ICD-10-CM | POA: Diagnosis not present

## 2020-02-04 LAB — POCT URINALYSIS DIPSTICK, ED / UC
Bilirubin Urine: NEGATIVE
Glucose, UA: NEGATIVE mg/dL
Hgb urine dipstick: NEGATIVE
Ketones, ur: NEGATIVE mg/dL
Nitrite: POSITIVE — AB
Protein, ur: NEGATIVE mg/dL
Specific Gravity, Urine: 1.025 (ref 1.005–1.030)
Urobilinogen, UA: 0.2 mg/dL (ref 0.0–1.0)
pH: 6 (ref 5.0–8.0)

## 2020-02-04 LAB — POC URINE PREG, ED: Preg Test, Ur: NEGATIVE

## 2020-02-04 MED ORDER — CEPHALEXIN 500 MG PO CAPS: 500.0000 mg | ORAL_CAPSULE | Freq: Two times a day (BID) | ORAL | 0 refills | Status: DC

## 2020-02-04 NOTE — ED Triage Notes (Signed)
Pt c/o pregnancy like symptoms. She states she feels tired, nauseous, increased appetite and abd pain. She states since her last visit here she saw her obgyn and had a vaginal ultrasound and another pregnancy test and it was negative. Pt states symptoms have continued since October and have not gotten better.

## 2020-02-04 NOTE — Discharge Instructions (Signed)
We will if the antibiotic needs to be changed or stopped pending the urine culture  Please try taking iron every other day  Please follow up if your symptoms fail to improve.

## 2020-02-04 NOTE — ED Provider Notes (Signed)
[facility]    CSN: [id number] Arrival date & time: 02/04/20  1638      History   Chief Complaint Chief Complaint  Patient presents with  . Abdominal Pain  . Nausea    HPI Margaret Ingram is a 24 y.o. female.   She is presenting with lower quadrant abdominal pain has been ongoing since October.  She is having fatigue and malaise.  She reports that her IUD is in place.  Denies any changes in bowel movements.  Has had some changes with urination.  Denies any emesis.  No fevers.  HPI  Past Medical History:  Diagnosis Date  . Anxiety   . Depression    with pregnancies  . Diabetes mellitus without complication ([facility])    pre diabetic  . Fibroid   . Heart murmur   . Hypertension    chronic  . Infection    UTI  . Pregnancy induced hypertension   . Preterm contractions 06/12/2017  . Seizures ([facility])    anxiety    Patient Active Problem List   Diagnosis Date Noted  . Pre-diabetes 06/22/2019  . Preterm labor in second trimester with preterm delivery in second trimester 06/22/2019  . Preterm labor with preterm delivery in second trimester   . 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 Hx of chlamydia infection 08/17/2018  . Hx of preeclampsia, prior pregnancy, currently pregnant, third trimester 08/17/2018  . Chronic hypertension 08/17/2018  . Depression 08/17/2018  . Vaginal bleeding in pregnancy, first trimester 03/26/2018    Past Surgical History:  Procedure Laterality Date  . CESAREAN SECTION N/A 06/22/2019   Procedure: CESAREAN SECTION;  Surgeon: Adam Phenix, MD;  Location: [facility] LD ORS;  Service: Obstetrics;  Laterality: N/A;  STAT Cord prolapse Liletta  . NO PAST SURGERIES    . WISDOM TOOTH EXTRACTION      OB History    Gravida  4   Para  4   Term  2   Preterm  2   AB      Living  4     SAB      IAB      Ectopic      Multiple  0   Live Births  4            Home Medications    Prior to Admission medications    Medication Sig Start Date End Date Taking? Authorizing Provider  acetaminophen (TYLENOL) 325 MG tablet Take 650 mg by mouth every 6 (six) hours as needed for mild pain or headache.    [provider]  Blood Pressure Monitoring (ADULT BLOOD PRESSURE CUFF LG) KIT 1 kit by Does not apply route daily. Call or return to the hospital if your blood pressure is running higher than 160 for the top number or 110 for the bottom number.  Check your blood pressure if you experience a headache that does not resolve or changes in your vision or other severe symptoms. 06/26/19   Pezzuto, Vernona Rieger, MD  cephALEXin (KEFLEX) 500 MG capsule Take 1 capsule (500 mg total) by mouth 2 (two) times daily. 02/04/20   Myra Rude, MD  ferrous sulfate 325 (65 FE) MG tablet Take 325 mg by mouth daily with breakfast.    [provider]  hydrochlorothiazide (HYDRODIURIL) 25 MG tablet Take 1 tablet (25 mg total) by mouth daily. 06/26/19   Catalina Pizza, MD  ibuprofen (ADVIL) 800 MG tablet Take 1 tablet (800 mg  total) by mouth every 6 (six) hours. 06/26/19   Pezzuto, Mickel Baas, MD  lidocaine (LIDODERM) 5 % Place 1 patch onto the skin daily. Remove & Discard patch within 12 hours or as directed by MD 12/24/19   Henderly, Britni A, PA-C  naproxen (NAPROSYN) 500 MG tablet Take 1 tablet (500 mg total) by mouth 2 (two) times daily. 12/24/19   Henderly, Britni A, PA-C  NIFEdipine (ADALAT CC) 60 MG 24 hr tablet Take 1 tablet (60 mg total) by mouth daily. 06/26/19   Pezzuto, Mickel Baas, MD  oxyCODONE (OXY IR/ROXICODONE) 5 MG immediate release tablet Take 1 tablet (5 mg total) by mouth every 4 (four) hours as needed for severe pain. 06/26/19   Debbrah Alar, MD  Prenatal Vit-Fe Fumarate-FA (PRENATAL PO) Take 1 tablet by mouth daily.    [provider]    Family History Family History  Problem Relation Age of Onset  . Diabetes Other   . Healthy Mother   . Healthy Father     Social History Social History   Tobacco Use  .  Smoking status: Current Some Day Smoker    Types: Cigarettes  . Smokeless tobacco: Never Used  . Tobacco comment: occ  Vaping Use  . Vaping Use: Never used  Substance Use Topics  . Alcohol use: Not Currently    Comment: occasionally   . Drug use: Not Currently    Types: Marijuana    Comment: "not since I got pregnant"     Allergies   Azithromycin and Other   Review of Systems Review of Systems  See HPI  Physical Exam Triage Vital Signs ED Triage Vitals [02/04/20 1743]  Enc Vitals Group     BP      Pulse      Resp      Temp      Temp src      SpO2      Weight      Height      Head Circumference      Peak Flow      Pain Score 6     Pain Loc      Pain Edu?      Excl. in [place]?    No data found.  Updated Vital Signs BP (P) 132/89 (BP Location: Left Arm)   Pulse (P) 79   Temp (P) 98.5 F (36.9 C) (Oral)   Resp (P) 20   SpO2 (P) 100%   Visual Acuity Right Eye Distance:   Left Eye Distance:   Bilateral Distance:    Right Eye Near:   Left Eye Near:    Bilateral Near:     Physical Exam Gen: NAD, alert, cooperative with exam, well-appearing ENT: normal lips, normal nasal mucosa,  Eye: normal EOM, normal conjunctiva and lids GI: no masses or tenderness, no hernia  Skin: no rashes, no areas of induration  Neuro: normal tone, normal sensation to touch Psych:  normal insight, alert and oriented     UC Treatments / Results  Labs (all labs ordered are listed, but only abnormal results are displayed) Labs Reviewed  POCT URINALYSIS DIPSTICK, ED / UC - Abnormal; Notable for the following components:      Result Value   Nitrite POSITIVE (*)    Leukocytes,Ua TRACE (*)    All other components within normal limits  URINE CULTURE  POC URINE PREG, ED    EKG   Radiology No results found.  Procedures Procedures (including critical care time)  Medications  Ordered in UC Medications - No data to display  Initial Impression / Assessment and Plan / UC  Course  I have reviewed the triage vital signs and the nursing notes.  Pertinent labs & imaging results that were available during my care of the patient were reviewed by me and considered in my medical decision making (see chart for details).     Margaret Ingram is a 24 year old female is presenting with lower quadrant abdominal pain.  Urinalysis is suspecting of an infection.  Will send for urine culture.  Treat with Keflex.  Also advised to take oral iron.  Given indications on follow-up.  Counseled supportive care.  Final Clinical Impressions(s) / UC Diagnoses   Final diagnoses:  Cystitis  Iron deficiency anemia, unspecified iron deficiency anemia type     Discharge Instructions     We will if the antibiotic needs to be changed or stopped pending the urine culture  Please try taking iron every other day  Please follow up if your symptoms fail to improve.     ED Prescriptions    Medication Sig Dispense Auth. Provider   cephALEXin (KEFLEX) 500 MG capsule Take 1 capsule (500 mg total) by mouth 2 (two) times daily. 14 capsule Rosemarie Ax, MD     PDMP not reviewed this encounter.   Rosemarie Ax, MD 02/04/20 Vernelle Emerald

## 2020-02-05 LAB — URINE CULTURE: Culture: <10000 — AB

## 2020-05-08 ENCOUNTER — Ambulatory Visit (HOSPITAL_COMMUNITY)
Admission: EM | Admit: 2020-05-08 | Discharge: 2020-05-08 | Disposition: A | Discharge status: Home / Self Care | Payer: Medicaid Other | Attending: Emergency Medicine | Admitting: Emergency Medicine

## 2020-05-08 ENCOUNTER — Encounter (HOSPITAL_COMMUNITY): Payer: Self-pay

## 2020-05-08 ENCOUNTER — Other Ambulatory Visit: Payer: Self-pay

## 2020-05-08 DIAGNOSIS — Z3202 Encounter for pregnancy test, result negative: Secondary | ICD-10-CM

## 2020-05-08 DIAGNOSIS — R112 Nausea with vomiting, unspecified: Secondary | ICD-10-CM

## 2020-05-08 LAB — POC URINE PREG, ED: Preg Test, Ur: NEGATIVE

## 2020-05-08 LAB — POCT URINALYSIS DIPSTICK, ED / UC
Bilirubin Urine: NEGATIVE
Glucose, UA: NEGATIVE mg/dL
Ketones, ur: NEGATIVE mg/dL
Leukocytes,Ua: NEGATIVE
Nitrite: NEGATIVE
Protein, ur: NEGATIVE mg/dL
Specific Gravity, Urine: 1.025 (ref 1.005–1.030)
Urobilinogen, UA: 4 mg/dL — ABNORMAL HIGH (ref 0.0–1.0)
pH: 6 (ref 5.0–8.0)

## 2020-05-08 LAB — CBG MONITORING, ED: Glucose-Capillary: 90 mg/dL (ref 70–99)

## 2020-05-08 MED ORDER — ONDANSETRON 8 MG PO TBDP: ORAL_TABLET | ORAL | 0 refills | Status: DC

## 2020-05-08 NOTE — ED Provider Notes (Signed)
HPI  SUBJECTIVE:  Margaret Ingram is a G6P4 25 y.o. female who presents with possible pregnancy.  She reports 6 days of nausea, vomiting, difficulty tolerating p.o., "sleeping a lot", moodiness, and breast tenderness, breast enlargement over the past 6 days.  She had her IUD removed 6 days ago and has had intercourse daily with her fianc.  They have been together for over 6 years, and they are actively trying to get pregnant.  He is asymptomatic.  STDs are not a concern today. She states that she is keeping fluids and salty things down.  She reports sensitivity to smells, and urinary frequency.  Mild abdominal pain present before vomiting only, which then resolves.  She does not have abdominal pain at any other time.  No change in urine output.  No dysuria, urgency, cloudy or odorous urine, hematuria.  No vaginal odor, discharge, bleeding.  No fevers, respiratory complaints, sore throat, nasal congestion, body aches, headaches, loss of sense of smell or taste.  No known exposure to Covid.  States that she gets tested every week for COVID and has been negative thus far.  She has tried pushing fluids, Gatorade, eating salty foods with improvement in her symptoms.  Symptoms worse with strong smells.  She has a past medical history of diabetes, does not take any medications for it and does not check her glucose at home.  She has a past medical history of seizures, pregnancy-induced hypertension and preeclampsia.  No history of STDs, BV, yeast, PID.  LMP: Amenorrheic secondary to the IUD.  PMD: [facility], [facility]     Past Medical History:  Diagnosis Date  . Anxiety   . Depression    with pregnancies  . Diabetes mellitus without complication ([place])    pre diabetic  . Fibroid   . Heart murmur   . Hypertension    chronic  . Infection    UTI  . Pregnancy induced hypertension   . Preterm contractions 06/12/2017  . Seizures ([place])    anxiety    Past Surgical History:  Procedure  Laterality Date  . CESAREAN SECTION N/A 06/22/2019   Procedure: CESAREAN SECTION;  Surgeon: Woodroe Mode, MD;  Location: [facility] LD ORS;  Service: Obstetrics;  Laterality: N/A;  STAT Cord prolapse [place]  . NO PAST SURGERIES    . WISDOM TOOTH EXTRACTION      Family History  Problem Relation Age of Onset  . Diabetes Other   . Healthy Mother   . Healthy Father     Social History   Tobacco Use  . Smoking status: Current Some Day Smoker    Types: Cigarettes  . Smokeless tobacco: Never Used  . Tobacco comment: occ  Vaping Use  . Vaping Use: Never used  Substance Use Topics  . Alcohol use: Not Currently    Comment: occasionally   . Drug use: Not Currently    Types: Marijuana    Comment: "not since I got pregnant"    No current facility-administered medications for this encounter.  Current Outpatient Medications:  .  ondansetron (ZOFRAN ODT) 8 MG disintegrating tablet, 1/2- 1 tablet q 8 hr prn nausea, vomiting, Disp: 20 tablet, Rfl: 0 .  acetaminophen (TYLENOL) 325 MG tablet, Take 650 mg by mouth every 6 (six) hours as needed for mild pain or headache., Disp: , Rfl:  .  Blood Pressure Monitoring (ADULT BLOOD PRESSURE CUFF LG) KIT, 1 kit by Does not apply route daily. Call or return to the hospital if  your blood pressure is running higher than 160 for the top number or 110 for the bottom number.  Check your blood pressure if you experience a headache that does not resolve or changes in your vision or other severe symptoms., Disp: 1 kit, Rfl: 0 .  ferrous sulfate 325 (65 FE) MG tablet, Take 325 mg by mouth daily with breakfast., Disp: , Rfl:  .  hydrochlorothiazide (HYDRODIURIL) 25 MG tablet, Take 1 tablet (25 mg total) by mouth daily., Disp: 30 tablet, Rfl: 2 .  ibuprofen (ADVIL) 800 MG tablet, Take 1 tablet (800 mg total) by mouth every 6 (six) hours., Disp: 30 tablet, Rfl: 0 .  lidocaine (LIDODERM) 5 %, Place 1 patch onto the skin daily. Remove & Discard patch within 12 hours or as  directed by MD, Disp: 30 patch, Rfl: 0 .  naproxen (NAPROSYN) 500 MG tablet, Take 1 tablet (500 mg total) by mouth 2 (two) times daily., Disp: 30 tablet, Rfl: 0 .  NIFEdipine (ADALAT CC) 60 MG 24 hr tablet, Take 1 tablet (60 mg total) by mouth daily., Disp: 30 tablet, Rfl: 2 .  oxyCODONE (OXY IR/ROXICODONE) 5 MG immediate release tablet, Take 1 tablet (5 mg total) by mouth every 4 (four) hours as needed for severe pain., Disp: 15 tablet, Rfl: 0 .  Prenatal Vit-Fe Fumarate-FA (PRENATAL PO), Take 1 tablet by mouth daily., Disp: , Rfl:   Allergies  Allergen Reactions  . Azithromycin Nausea And Vomiting and Nausea Only  . Other Rash    *Brown Band-Aid*     ROS  As noted in HPI.   Physical Exam  BP 111/61 (BP Location: Right Arm)   Pulse 84   Temp 98.5 F (36.9 C) (Oral)   Resp 16   SpO2 100%   Constitutional: Well developed, well nourished, no acute distress Eyes:  EOMI, conjunctiva normal bilaterally HENT: Normocephalic, atraumatic,mucus membranes moist Respiratory: Normal inspiratory effort, lungs clear bilaterally Cardiovascular: Normal rate, regular rhythm no murmurs rubs or gallops.  Cap refill less than 2 seconds  GI: nondistended soft, nontender, no guarding, rebound.  Active bowel sounds. Back: No CVAT skin: No rash, skin intact Musculoskeletal: no deformities Neurologic: Alert & oriented x 3, no focal neuro deficits Psychiatric: Speech and behavior appropriate   ED Course   Medications - No data to display  Orders Placed This Encounter  Procedures  . POC urine pregnancy    Standing Status:   Standing    Number of Occurrences:   1  . POC Urinalysis dipstick    Standing Status:   Standing    Number of Occurrences:   1  . POC CBG monitoring    Standing Status:   Standing    Number of Occurrences:   1    Results for orders placed or performed during the hospital encounter of 05/08/20 (from the past 24 hour(s))  POC Urinalysis dipstick     Status: Abnormal    Collection Time: 05/08/20  5:29 PM  Result Value Ref Range   Glucose, UA NEGATIVE NEGATIVE mg/dL   Bilirubin Urine NEGATIVE NEGATIVE   Ketones, ur NEGATIVE NEGATIVE mg/dL   Specific Gravity, Urine 1.025 1.005 - 1.030   Hgb urine dipstick TRACE (A) NEGATIVE   pH 6.0 5.0 - 8.0   Protein, ur NEGATIVE NEGATIVE mg/dL   Urobilinogen, UA 4.0 (H) 0.0 - 1.0 mg/dL   Nitrite NEGATIVE NEGATIVE   Leukocytes,Ua NEGATIVE NEGATIVE  POC CBG monitoring     Status: None   Collection  Time: 05/08/20  5:30 PM  Result Value Ref Range   Glucose-Capillary 90 70 - 99 mg/dL  POC urine pregnancy     Status: None   Collection Time: 05/08/20  5:32 PM  Result Value Ref Range   Preg Test, Ur NEGATIVE NEGATIVE   No results found.  ED Clinical Impression  1. Non-intractable vomiting with nausea, unspecified vomiting type   2. Negative pregnancy test      ED Assessment/Plan  Urine pregnancy negative.  UA negative for UTI.  Glucose 90.  Patient's abdomen is benign.  She appears well-hydrated.  Patient denies any other COVID symptoms.  States that she is tested every week and has been negative recently.  Her primary concern is pregnancy.  Will send home with Zofran.  We discussed doing a beta quant today, but she states that she can wait and recheck a urine pregnancy test in a week.  She will follow-up with her OB/GYN or PMD as needed.  Discussed labs,  MDM, treatment plan, and plan for follow-up with patient. . patient agrees with plan.   Meds ordered this encounter  Medications  . ondansetron (ZOFRAN ODT) 8 MG disintegrating tablet    Sig: 1/2- 1 tablet q 8 hr prn nausea, vomiting    Dispense:  20 tablet    Refill:  0    *This clinic note was created using [employer]. Therefore, there may be occasional mistakes despite careful proofreading.   ?    Melynda Ripple, MD 05/09/20 [phone]

## 2020-05-08 NOTE — ED Triage Notes (Signed)
Pt present vomiting and possible pregnancy. Pt recently had her implant removed last Monday. This week she has been vomiting, soreness to the breast, mood changes and tired.

## 2020-05-08 NOTE — Discharge Instructions (Addendum)
Continue Gatorade, Pedialyte, potato chips/bland foods.  Zofran as needed for nausea and vomiting.  Recheck a pregnancy test in about a week.

## 2020-05-12 ENCOUNTER — Other Ambulatory Visit: Payer: Self-pay

## 2020-05-12 ENCOUNTER — Inpatient Hospital Stay (HOSPITAL_COMMUNITY)
Admission: AD | Admit: 2020-05-12 | Discharge: 2020-05-12 | Disposition: A | Discharge status: Home / Self Care | Payer: Medicaid Other | Attending: Obstetrics & Gynecology | Admitting: Obstetrics & Gynecology

## 2020-05-12 DIAGNOSIS — Z3202 Encounter for pregnancy test, result negative: Secondary | ICD-10-CM

## 2020-05-12 LAB — POCT PREGNANCY, URINE: Preg Test, Ur: NEGATIVE

## 2020-05-12 NOTE — MAU Note (Signed)
Margaret Ingram is a 25 y.o. here in MAU reporting: been having nausea and vomiting for 2 weeks. Also reporting cramping and some back pain. Currently has abdominal pain. No bleeding  LMP: has not had a period, states no periods with IUD in place and none since removal at the beginning of march  Onset of complaint: ongoing  Pain score: 4/10  Vitals:   05/12/20 1753  BP: (!) 142/77  Pulse: 72  Resp: 16  Temp: 98.5 F (36.9 C)  SpO2: 97%     Lab orders placed from triage: UPT

## 2020-05-12 NOTE — MAU Provider Note (Signed)
Event Date/Time   First Provider Initiated Contact with Patient 05/12/20 1816     S [name] is a 25 y.o. [phone] non-pregnant female who presents to MAU today with complaint of nausea, fatigue and possible pregnancy. She is trying to get pregnant, took out her IUD at the beginning of March, had + ovulation tests two weeks ago and has not yet gotten her period this month. No +UPT at home yet. Concerned because she is considered high risk and will need to start progesterone shots.  O BP (!) 142/77 (BP Location: Right Arm)   Pulse 72   Temp 98.5 F (36.9 C) (Oral)   Resp 16   SpO2 97% Comment: room air Physical Exam Vitals and nursing note reviewed.  Constitutional:      General: She is not in acute distress.    Appearance: She is well-developed. She is not ill-appearing.  Cardiovascular:     Rate and Rhythm: Normal rate.  Pulmonary:     Effort: Pulmonary effort is normal.  Musculoskeletal:        General: Normal range of motion.  Skin:    General: Skin is warm and dry.     Capillary Refill: Capillary refill takes less than 2 seconds.  Neurological:     General: No focal deficit present.     Mental Status: She is alert. Mental status is at baseline. She is disoriented.  Psychiatric:        Mood and Affect: Mood normal.        Behavior: Behavior normal.        Thought Content: Thought content normal.        Judgment: Judgment normal.    Results for orders placed or performed during the hospital encounter of 05/12/20 (from the past 24 hour(s))  Pregnancy, urine POC     Status: None   Collection Time: 05/12/20  5:45 PM  Result Value Ref Range   Preg Test, Ur NEGATIVE NEGATIVE    A Negative pregnancy test Medical screening exam complete  P Discharge from MAU in stable condition - advised to wait a few days and take another home pregnancy test Follow up at regular OB (Dr. Posey Pronto) in [facility] Warning signs for worsening condition that would warrant emergency  follow-up discussed Patient may return to MAU as needed for emergent OB/GYN related complaints  Gabriel Carina, CNM 05/12/2020 6:17 PM

## 2020-05-16 ENCOUNTER — Other Ambulatory Visit: Payer: Self-pay

## 2020-05-16 ENCOUNTER — Inpatient Hospital Stay (HOSPITAL_COMMUNITY)
Admission: AD | Admit: 2020-05-16 | Discharge: 2020-05-16 | Disposition: A | Discharge status: Home / Self Care | Payer: Medicaid Other | Attending: Family Medicine | Admitting: Family Medicine

## 2020-05-16 DIAGNOSIS — Z789 Other specified health status: Secondary | ICD-10-CM

## 2020-05-16 DIAGNOSIS — Z3202 Encounter for pregnancy test, result negative: Secondary | ICD-10-CM | POA: Diagnosis not present

## 2020-05-16 DIAGNOSIS — R109 Unspecified abdominal pain: Secondary | ICD-10-CM | POA: Insufficient documentation

## 2020-05-16 LAB — HCG, QUANTITATIVE, PREGNANCY: hCG, Beta Chain, Quant, S: <1 m[IU]/mL (ref <5)

## 2020-05-16 LAB — POCT PREGNANCY, URINE: Preg Test, Ur: NEGATIVE

## 2020-05-16 NOTE — MAU Provider Note (Signed)
None     S [name] is a 25 y.o. [phone] patient who presents to MAU today with complaint of positive pregnancy test at home with some mild cramping.   O BP (!) 132/92 (BP Location: Right Arm)   Pulse 75   Temp 98.3 F (36.8 C) (Oral)   Resp 18   SpO2 100% Comment: room air Physical Exam Vitals reviewed.  Constitutional:      Appearance: She is well-developed.  Pulmonary:     Effort: Pulmonary effort is normal.  Skin:    General: Skin is warm and dry.     Capillary Refill: Capillary refill takes less than 2 seconds.  Neurological:     General: No focal deficit present.     Mental Status: She is alert and oriented to person, place, and time.  Psychiatric:        Mood and Affect: Mood normal.        Behavior: Behavior normal.     A Medical screening exam complete Not pregnancy  P Discharge from MAU in stable condition Patient given the option of transfer to [facility] for further evaluation or seek care in outpatient facility of choice Warning signs for worsening condition that would warrant emergency follow-up discussed Patient may return to MAU as needed   Truett Mainland, DO 05/16/2020 11:02 AM

## 2020-05-16 NOTE — MAU Note (Signed)
Margaret Ingram is a 25 y.o. here in MAU reporting: states she has had 10 + UPT since yesterday morning. Having some left lower abdominal pain. No bleeding. No discharge.  Onset of complaint: today  Pain score: 5/10  Vitals:   05/16/20 0830  BP: (!) 132/92  Pulse: 75  Resp: 18  Temp: 98.3 F (36.8 C)  SpO2: 100%     Lab orders placed from triage: UPT

## 2020-05-19 ENCOUNTER — Other Ambulatory Visit: Payer: Self-pay

## 2020-05-19 ENCOUNTER — Emergency Department (HOSPITAL_COMMUNITY): Payer: Medicaid Other

## 2020-05-19 ENCOUNTER — Encounter (HOSPITAL_COMMUNITY): Payer: Self-pay

## 2020-05-19 ENCOUNTER — Emergency Department (HOSPITAL_COMMUNITY)
Admission: EM | Admit: 2020-05-19 | Discharge: 2020-05-19 | Disposition: A | Discharge status: Home / Self Care | Payer: Medicaid Other | Attending: Emergency Medicine | Admitting: Emergency Medicine

## 2020-05-19 DIAGNOSIS — Z79899 Other long term (current) drug therapy: Secondary | ICD-10-CM | POA: Insufficient documentation

## 2020-05-19 DIAGNOSIS — I10 Essential (primary) hypertension: Secondary | ICD-10-CM | POA: Insufficient documentation

## 2020-05-19 DIAGNOSIS — F17210 Nicotine dependence, cigarettes, uncomplicated: Secondary | ICD-10-CM | POA: Diagnosis not present

## 2020-05-19 DIAGNOSIS — R1030 Lower abdominal pain, unspecified: Secondary | ICD-10-CM

## 2020-05-19 DIAGNOSIS — N939 Abnormal uterine and vaginal bleeding, unspecified: Secondary | ICD-10-CM

## 2020-05-19 DIAGNOSIS — R112 Nausea with vomiting, unspecified: Secondary | ICD-10-CM | POA: Diagnosis not present

## 2020-05-19 LAB — I-STAT BETA HCG BLOOD, ED (MC, WL, AP ONLY): I-stat hCG, quantitative: <5 m[IU]/mL (ref <5)

## 2020-05-19 LAB — COMPREHENSIVE METABOLIC PANEL
ALT: 16 U/L (ref 0–44)
AST: 18 U/L (ref 15–41)
Albumin: 4.1 g/dL (ref 3.5–5.0)
Alkaline Phosphatase: 76 U/L (ref 38–126)
Anion gap: 6 (ref 5–15)
BUN: 16 mg/dL (ref 6–20)
CO2: 27 mmol/L (ref 22–32)
Calcium: 9.2 mg/dL (ref 8.9–10.3)
Chloride: 107 mmol/L (ref 98–111)
Creatinine, Ser: 0.75 mg/dL (ref 0.44–1.00)
GFR, Estimated: >60 mL/min (ref >60)
Glucose, Bld: 88 mg/dL (ref 70–99)
Potassium: 4 mmol/L (ref 3.5–5.1)
Sodium: 140 mmol/L (ref 135–145)
Total Bilirubin: 0.5 mg/dL (ref 0.3–1.2)
Total Protein: 7.7 g/dL (ref 6.5–8.1)

## 2020-05-19 LAB — URINALYSIS, ROUTINE W REFLEX MICROSCOPIC
Bacteria, UA: NONE SEEN
Bilirubin Urine: NEGATIVE
Glucose, UA: NEGATIVE mg/dL
Ketones, ur: NEGATIVE mg/dL
Leukocytes,Ua: NEGATIVE
Nitrite: NEGATIVE
Protein, ur: NEGATIVE mg/dL
RBC / HPF: >50 RBC/hpf — ABNORMAL HIGH (ref 0–5)
Specific Gravity, Urine: 1.02 (ref 1.005–1.030)
pH: 7 (ref 5.0–8.0)

## 2020-05-19 LAB — WET PREP, GENITAL
Sperm: NONE SEEN
Trich, Wet Prep: NONE SEEN
Yeast Wet Prep HPF POC: NONE SEEN

## 2020-05-19 LAB — LIPASE, BLOOD: Lipase: 26 U/L (ref 11–51)

## 2020-05-19 LAB — CBC
HCT: 36.7 % (ref 36.0–46.0)
Hemoglobin: 11.8 g/dL — ABNORMAL LOW (ref 12.0–15.0)
MCH: 28.3 pg (ref 26.0–34.0)
MCHC: 32.2 g/dL (ref 30.0–36.0)
MCV: 88 fL (ref 80.0–100.0)
Platelets: 190 10*3/uL (ref 150–400)
RBC: 4.17 MIL/uL (ref 3.87–5.11)
RDW: 11.9 % (ref 11.5–15.5)
WBC: 4.4 10*3/uL (ref 4.0–10.5)
nRBC: 0 % (ref 0.0–0.2)

## 2020-05-19 MED ORDER — IOHEXOL 300 MG/ML  SOLN
100.0000 mL | Freq: Once | INTRAMUSCULAR | Status: AC | PRN
  Administered 2020-05-19: 100 mL via INTRAVENOUS

## 2020-05-19 MED ORDER — DIPHENHYDRAMINE HCL 50 MG/ML IJ SOLN
25.0000 mg | Freq: Once | INTRAMUSCULAR | Status: AC
  Administered 2020-05-19: 25 mg via INTRAVENOUS
  Filled 2020-05-19: qty 1

## 2020-05-19 MED ORDER — DICYCLOMINE HCL 20 MG PO TABS: 20.0000 mg | ORAL_TABLET | Freq: Two times a day (BID) | ORAL | 0 refills | Status: DC | PRN

## 2020-05-19 MED ORDER — METOCLOPRAMIDE HCL 5 MG/ML IJ SOLN
10.0000 mg | Freq: Once | INTRAMUSCULAR | Status: AC
  Administered 2020-05-19: 10 mg via INTRAVENOUS
  Filled 2020-05-19: qty 2

## 2020-05-19 MED ORDER — METOCLOPRAMIDE HCL 10 MG PO TABS: 10.0000 mg | ORAL_TABLET | Freq: Three times a day (TID) | ORAL | 0 refills | Status: DC | PRN

## 2020-05-19 MED ORDER — ACETAMINOPHEN 325 MG PO TABS
650.0000 mg | ORAL_TABLET | Freq: Once | ORAL | Status: AC
  Administered 2020-05-19: 650 mg via ORAL
  Filled 2020-05-19: qty 2

## 2020-05-19 MED ORDER — ONDANSETRON HCL 4 MG/2ML IJ SOLN
4.0000 mg | Freq: Once | INTRAMUSCULAR | Status: AC
  Administered 2020-05-19: 4 mg via INTRAVENOUS
  Filled 2020-05-19: qty 2

## 2020-05-19 NOTE — ED Triage Notes (Signed)
Patient c/o lower abdominal pain x 1 week and vaginal bleeding that started today. Patient states she has had 5 home pregnancy tests that were negative. Patient states that she went to [facility] on 05/16/20 and was told the pregnancy test was negative. Patient also c/o emesis and lower back pain since the beginning of March.

## 2020-05-19 NOTE — ED Notes (Signed)
Patient transported to CT 

## 2020-05-19 NOTE — Discharge Instructions (Signed)
Use reglan as needed nausea/vomiting.  Use bentyl as needed for abdominal pain/cramping.  Follow up with the GI doctor as needed for continued pain.  Follow up with your OB/GYN if you still have vaginal bleeding.  Return to the ER if you develop fever, severe worsening pain, or any new, worsening, or concerning symptoms.

## 2020-05-19 NOTE — ED Provider Notes (Signed)
[place] DEPT Provider Note   CSN: [id number] Arrival date & time: 05/19/20  1548     History Chief Complaint  Patient presents with  . Vaginal Bleeding    Margaret Ingram is a 25 y.o. female presenting for evaluation of vaginal bleeding and abdominal pain.   Patient states she has been having persistent issues with nausea, vomiting abdominal pain for the past month.  She had issues with an IUD earlier this month, it was removed by OB/GYN.  She then had vaginal bleeding and pain.  She has been evaluated multiple times for this.  She has had multiple home pregnancy tests, however reports she consistently has negative tests with medical providers.  She was on her way to her OB office, 1 is recommended she come to the ER due to pain.  She reports daily nausea/vomiting.  She also reports feeling tired and states the symptoms are similar to when she was pregnant before.  Her vaginal bleeding restarted today, no significant clots.  She is considered high risk as she has history of preeclampsia and preterm delivery.  She has been taking Tylenol for pain without improvement, last dose yesterday.  She is not taking anything for nausea or vomiting.  She reports no other medical problems, does not take any medications daily.  I reviewed patient's recent MAU notes as well as OB notes from earlier this month  HPI     Past Medical History:  Diagnosis Date  . Anxiety   . Depression    with pregnancies  . Diabetes mellitus without complication ([place])    pre diabetic  . Fibroid   . Heart murmur   . Hypertension    chronic  . Infection    UTI  . Pregnancy induced hypertension   . Preterm contractions 06/12/2017  . Seizures ([place])    anxiety    Patient Active Problem List   Diagnosis Date Noted  . Pre-diabetes 06/22/2019  . Preterm labor in second trimester with preterm delivery in second trimester 06/22/2019  . Preterm labor with preterm delivery in second  trimester   . 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 Hx of chlamydia infection 08/17/2018  . Hx of preeclampsia, prior pregnancy, currently pregnant, third trimester 08/17/2018  . Chronic hypertension 08/17/2018  . Depression 08/17/2018  . Vaginal bleeding in pregnancy, first trimester 03/26/2018    Past Surgical History:  Procedure Laterality Date  . CESAREAN SECTION N/A 06/22/2019   Procedure: CESAREAN SECTION;  Surgeon: Woodroe Mode, MD;  Location: [facility] LD ORS;  Service: Obstetrics;  Laterality: N/A;  STAT Cord prolapse Liletta  . NO PAST SURGERIES    . WISDOM TOOTH EXTRACTION       OB History    Gravida  4   Para  4   Term  2   Preterm  2   AB      Living  4     SAB      IAB      Ectopic      Multiple  0   Live Births  4           Family History  Problem Relation Age of Onset  . Diabetes Other   . Healthy Mother   . Healthy Father     Social History   Tobacco Use  . Smoking status: Current Some Day Smoker    Types: Cigarettes  . Smokeless tobacco: Never Used  . Tobacco comment: occ  Vaping Use  . Vaping Use: Never used  Substance Use Topics  . Alcohol use: Not Currently    Comment: occasionally   . Drug use: Not Currently    Types: Marijuana    Comment: "not since I got pregnant"    Home Medications Prior to Admission medications   Medication Sig Start Date End Date Taking? Authorizing Provider  dicyclomine (BENTYL) 20 MG tablet Take 1 tablet (20 mg total) by mouth 2 (two) times daily as needed for spasms. 05/19/20  Yes Caccavale, Sophia, PA-C  metoCLOPramide (REGLAN) 10 MG tablet Take 1 tablet (10 mg total) by mouth every 8 (eight) hours as needed for nausea. 05/19/20  Yes Caccavale, Sophia, PA-C  acetaminophen (TYLENOL) 325 MG tablet Take 650 mg by mouth every 6 (six) hours as needed for mild pain or headache.    [provider]  Blood Pressure Monitoring (ADULT BLOOD PRESSURE CUFF LG) KIT 1 kit by Does not apply  route daily. Call or return to the hospital if your blood pressure is running higher than 160 for the top number or 110 for the bottom number.  Check your blood pressure if you experience a headache that does not resolve or changes in your vision or other severe symptoms. 06/26/19   Debbrah Alar, MD  ferrous sulfate 325 (65 FE) MG tablet Take 325 mg by mouth daily with breakfast.    [provider]  hydrochlorothiazide (HYDRODIURIL) 25 MG tablet Take 1 tablet (25 mg total) by mouth daily. 06/26/19   Debbrah Alar, MD  ibuprofen (ADVIL) 800 MG tablet Take 1 tablet (800 mg total) by mouth every 6 (six) hours. 06/26/19   Pezzuto, Mickel Baas, MD  lidocaine (LIDODERM) 5 % Place 1 patch onto the skin daily. Remove & Discard patch within 12 hours or as directed by MD 12/24/19   Henderly, Britni A, PA-C  naproxen (NAPROSYN) 500 MG tablet Take 1 tablet (500 mg total) by mouth 2 (two) times daily. 12/24/19   Henderly, Britni A, PA-C  NIFEdipine (ADALAT CC) 60 MG 24 hr tablet Take 1 tablet (60 mg total) by mouth daily. 06/26/19   Debbrah Alar, MD  ondansetron (ZOFRAN ODT) 8 MG disintegrating tablet 1/2- 1 tablet q 8 hr prn nausea, vomiting 05/08/20   Melynda Ripple, MD  oxyCODONE (OXY IR/ROXICODONE) 5 MG immediate release tablet Take 1 tablet (5 mg total) by mouth every 4 (four) hours as needed for severe pain. 06/26/19   Debbrah Alar, MD  Prenatal Vit-Fe Fumarate-FA (PRENATAL PO) Take 1 tablet by mouth daily.    [provider]    Allergies    Azithromycin and Other  Review of Systems   Review of Systems  Gastrointestinal: Positive for abdominal pain, nausea and vomiting.  Genitourinary: Positive for vaginal bleeding.  All other systems reviewed and are negative.   Physical Exam Updated Vital Signs BP (!) 154/97   Pulse 64   Temp 98.1 F (36.7 C) (Oral)   Resp 16   Ht 5' 2" (1.575 m)   Wt 122.9 kg   SpO2 100%   BMI 49.57 kg/m   Physical Exam Vitals and nursing note reviewed. Exam  conducted with a chaperone present.  Constitutional:      General: She is not in acute distress.    Appearance: She is well-developed. She is obese.     Comments: appears nontoxic  HENT:     Head: Normocephalic and atraumatic.  Eyes:     Extraocular Movements: Extraocular movements intact.  Conjunctiva/sclera: Conjunctivae normal.     Pupils: Pupils are equal, round, and reactive to light.  Cardiovascular:     Rate and Rhythm: Normal rate and regular rhythm.     Pulses: Normal pulses.  Pulmonary:     Effort: Pulmonary effort is normal. No respiratory distress.     Breath sounds: Normal breath sounds. No wheezing.  Abdominal:     General: There is no distension.     Palpations: Abdomen is soft. There is no mass.     Tenderness: There is abdominal tenderness. There is no guarding or rebound.     Comments: Tenderness to palpation of lower abdomen.  No rigidity, guarding, distention.  Negative rebound.  No peritonitis  Genitourinary:    Comments: Minimal blood noted coming from the cervix, no clots.  No cervical motion tenderness or adnexal tenderness.  No significant discomfort with pelvic exam.  No obvious masses.  No obvious discharge Musculoskeletal:        General: Normal range of motion.     Cervical back: Normal range of motion and neck supple.  Skin:    General: Skin is warm and dry.     Capillary Refill: Capillary refill takes less than 2 seconds.  Neurological:     Mental Status: She is alert and oriented to person, place, and time.     ED Results / Procedures / Treatments   Labs (all labs ordered are listed, but only abnormal results are displayed) Labs Reviewed  WET PREP, GENITAL - Abnormal; Notable for the following components:      Result Value   Clue Cells Wet Prep HPF POC PRESENT (*)    WBC, Wet Prep HPF POC FEW (*)    All other components within normal limits  CBC - Abnormal; Notable for the following components:   Hemoglobin 11.8 (*)    All other  components within normal limits  URINALYSIS, ROUTINE W REFLEX MICROSCOPIC - Abnormal; Notable for the following components:   Hgb urine dipstick LARGE (*)    RBC / HPF >50 (*)    All other components within normal limits  LIPASE, BLOOD  COMPREHENSIVE METABOLIC PANEL  I-STAT BETA HCG BLOOD, ED ([facility], [facility], [facility] ONLY)  GC/CHLAMYDIA PROBE AMP ([place]) NOT AT [facility]    EKG None  Radiology CT ABDOMEN PELVIS W CONTRAST  Result Date: 05/19/2020 CLINICAL DATA:  Nausea and vomiting EXAM: CT ABDOMEN AND PELVIS WITH CONTRAST TECHNIQUE: Multidetector CT imaging of the abdomen and pelvis was performed using the standard protocol following bolus administration of intravenous contrast. CONTRAST:  143m OMNIPAQUE IOHEXOL 300 MG/ML  SOLN COMPARISON:  None. FINDINGS: Lower chest: No acute abnormality. Hepatobiliary: No focal liver abnormality is seen. No gallstones, gallbladder wall thickening, or biliary dilatation. Pancreas: Unremarkable. No pancreatic ductal dilatation or surrounding inflammatory changes. Spleen: Normal in size without focal abnormality. Adrenals/Urinary Tract: Adrenal glands are unremarkable. Kidneys are normal, without renal calculi, focal lesion, or hydronephrosis. Bladder is unremarkable. Stomach/Bowel: Stomach is within normal limits. Appendix appears normal. No evidence of bowel wall thickening, distention, or inflammatory changes. Vascular/Lymphatic: No significant vascular findings are present. No enlarged abdominal or pelvic lymph nodes. Reproductive: Uterus and bilateral adnexa are unremarkable. Other: Small fat containing umbilical hernia. No abdominopelvic ascites. Musculoskeletal: No acute or significant osseous findings. IMPRESSION: Negative. No CT evidence for acute intra-abdominal or pelvic abnormality. Electronically Signed   By: KDonavan FoilM.D.   On: 05/19/2020 19:16    Procedures Procedures   Medications Ordered in ED Medications  acetaminophen (TYLENOL) tablet 650 mg  (650 mg Oral Given 05/19/20 1721)  ondansetron (ZOFRAN) injection 4 mg (4 mg Intravenous Given 05/19/20 1705)  metoCLOPramide (REGLAN) injection 10 mg (10 mg Intravenous Given 05/19/20 1833)  diphenhydrAMINE (BENADRYL) injection 25 mg (25 mg Intravenous Given 05/19/20 1833)  iohexol (OMNIPAQUE) 300 MG/ML solution 100 mL (100 mLs Intravenous Contrast Given 05/19/20 1857)    ED Course  I have reviewed the triage vital signs and the nursing notes.  Pertinent labs & imaging results that were available during my care of the patient were reviewed by me and considered in my medical decision making (see chart for details).    MDM Rules/Calculators/A&P                          Patient presenting for evaluation of vaginal bleeding and lower abdominal pain.  She also has nausea and vomiting.  On exam, patient peers nontoxic.  GU exam shows minimal bleeding, no obvious clots.  She does not have any significant tenderness on her GU exam, as such, consider GI cause.  Also she has patient has been evaluated multiple times by OB/GYN, she may benefit from a GI work-up.  Will obtain labs to ensure no anemia or signs of infection.  Will treat symptomatically.  Labs interpreted by me, overall reassuring.  Hemoglobin stable.  No leukocytosis.  Electrolytes stable.  CT abdomen pelvis negative for acute findings.  On reassessment, patient report significant improvement of medications after Reglan and Benadryl.  Will have patient follow-up with GI as needed, as well as OB/GYN for further evaluation of vaginal bleeding.  At this time, patient appears safe for discharge.  Return precautions given.  Patient states she understands and agrees to plan.  Final Clinical Impression(s) / ED Diagnoses Final diagnoses:  Vaginal bleeding  Lower abdominal pain    Rx / DC Orders ED Discharge Orders         Ordered    dicyclomine (BENTYL) 20 MG tablet  2 times daily PRN        05/19/20 2007    metoCLOPramide (REGLAN) 10 MG  tablet  Every 8 hours PRN        05/19/20 2007           Franchot Heidelberg, PA-C 05/19/20 2054    Hayden Rasmussen, MD 05/20/20 1030

## 2020-05-20 LAB — GC/CHLAMYDIA PROBE AMP (~~LOC~~) NOT AT ARMC
Chlamydia: NEGATIVE
Comment: NEGATIVE
Comment: NORMAL
Neisseria Gonorrhea: NEGATIVE

## 2020-06-16 ENCOUNTER — Inpatient Hospital Stay (HOSPITAL_COMMUNITY)
Admission: AD | Admit: 2020-06-16 | Discharge: 2020-06-16 | Discharge status: Against Medical Advice | Payer: Medicaid Other | Attending: Obstetrics and Gynecology | Admitting: Obstetrics and Gynecology

## 2020-06-16 NOTE — MAU Note (Signed)
Pt called, not in lobby. [place] CNM made aware.

## 2020-06-16 NOTE — MAU Note (Signed)
Pt called, not in lobby

## 2020-06-20 ENCOUNTER — Encounter (HOSPITAL_COMMUNITY): Payer: Self-pay | Admitting: *Deleted

## 2020-06-20 ENCOUNTER — Inpatient Hospital Stay (HOSPITAL_COMMUNITY)
Admission: AD | Admit: 2020-06-20 | Discharge: 2020-06-20 | Discharge status: Against Medical Advice | Payer: Medicaid Other | Attending: Obstetrics & Gynecology | Admitting: Obstetrics & Gynecology

## 2020-06-20 DIAGNOSIS — Z3202 Encounter for pregnancy test, result negative: Secondary | ICD-10-CM | POA: Insufficient documentation

## 2020-06-20 LAB — POCT PREGNANCY, URINE: Preg Test, Ur: NEGATIVE

## 2020-06-20 NOTE — MAU Provider Note (Addendum)
Event Date/Time  First Provider Initiated Contact with Patient 06/20/20 1724      S [name] is a 25 y.o. [phone] patient who presents to MAU today with complaint of having 5 positive UPT's, nausea/vomiting, and fatigue. Denies abdominal pain, vaginal bleeding, or vaginal discharge. Has appointment scheduled on 5/24 at [facility].    O BP 126/83 (BP Location: Right Arm)   Pulse (!) 102   Temp 98.1 F (36.7 C) (Oral)   Resp 18   Ht 5\' 2"  (1.575 m)   Wt 122.3 kg   LMP 05/19/2020   SpO2 98%   BMI 49.33 kg/m  Physical Exam Cardiovascular:     Rate and Rhythm: Normal rate.  Pulmonary:     Effort: Pulmonary effort is normal.  Abdominal:     General: Abdomen is flat.     Palpations: Abdomen is soft.  Musculoskeletal:        General: Normal range of motion.  Neurological:     General: No focal deficit present.     Mental Status: She is alert.  Psychiatric:        Mood and Affect: Mood normal.        Behavior: Behavior normal.    Results for orders placed or performed during the hospital encounter of 06/20/20 (from the past 24 hour(s))  Pregnancy, urine POC     Status: None   Collection Time: 06/20/20  4:47 PM  Result Value Ref Range   Preg Test, Ur NEGATIVE NEGATIVE   A Medical screening exam complete After negative UPT, patient left without blood draw for HCG or notifying staff.   P Discharge from MAU in stable condition Patient given the option of transfer to [facility] for further evaluation or seek care in outpatient facility of choice List of options for follow-up given Warning signs for worsening condition that would warrant emergency follow-up discussed Patient may return to MAU as needed   Wende Mott, CNM  06/20/20 7:25 PM

## 2020-06-20 NOTE — MAU Note (Signed)
No period in April, over a wk late.  Been throwing up lately. HPT x5, early tests were positive.  Threw up last night for sure. Has been really nauseated. Has been having mood swings.

## 2020-06-22 ENCOUNTER — Ambulatory Visit (HOSPITAL_COMMUNITY)
Admission: RE | Admit: 2020-06-22 | Discharge: 2020-06-22 | Disposition: A | Discharge status: Home / Self Care | Payer: Medicaid Other | Source: Ambulatory Visit | Attending: Emergency Medicine | Admitting: Emergency Medicine

## 2020-06-22 ENCOUNTER — Encounter (HOSPITAL_COMMUNITY): Payer: Self-pay

## 2020-06-22 ENCOUNTER — Other Ambulatory Visit: Payer: Self-pay

## 2020-06-22 VITALS — BP 126/68 | HR 96 | Temp 98.6°F | Resp 18

## 2020-06-22 DIAGNOSIS — N946 Dysmenorrhea, unspecified: Secondary | ICD-10-CM | POA: Diagnosis not present

## 2020-06-22 DIAGNOSIS — Z3202 Encounter for pregnancy test, result negative: Secondary | ICD-10-CM | POA: Insufficient documentation

## 2020-06-22 LAB — POCT URINALYSIS DIPSTICK, ED / UC
Bilirubin Urine: NEGATIVE
Glucose, UA: NEGATIVE mg/dL
Leukocytes,Ua: NEGATIVE
Nitrite: POSITIVE — AB
Protein, ur: NEGATIVE mg/dL
Specific Gravity, Urine: >=1.03 (ref 1.005–1.030)
Urobilinogen, UA: 0.2 mg/dL (ref 0.0–1.0)
pH: 5.5 (ref 5.0–8.0)

## 2020-06-22 LAB — POC URINE PREG, ED: Preg Test, Ur: NEGATIVE

## 2020-06-22 NOTE — ED Provider Notes (Signed)
[place]    CSN: [id number] Arrival date & time: 06/22/20  1740      History   Chief Complaint Chief Complaint  Patient presents with  . APPOINTMENT: Possible Pregnancy    HPI Margaret Ingram is a 25 y.o. female.   Patient here for evaluation of possible pregnancy.  Patient reports having irregular periods for the past few months.  Also reports having some mood swings and admits nausea.  Patient has multiple visits for the same.  IUD was removed at the beginning of March.  Denies any dysuria, urgency, or frequency.  Denies any pelvic pain or discharge.  Denies any trauma, injury, or other precipitating event.  Denies any specific alleviating or aggravating factors.  Denies any fevers, chest pain, shortness of breath, N/V/D, numbness, tingling, weakness, abdominal pain, or headaches.   ROS: As per HPI, all other pertinent ROS negative   The history is provided by the patient.    Past Medical History:  Diagnosis Date  . Anxiety   . Depression    with pregnancies  . Diabetes mellitus without complication ([place])    pre diabetic  . Fibroid   . Heart murmur   . Hypertension    chronic  . Infection    UTI  . Pregnancy induced hypertension   . Preterm contractions 06/12/2017  . Seizures ([place])    anxiety    Patient Active Problem List   Diagnosis Date Noted  . Pre-diabetes 06/22/2019  . Preterm labor in second trimester with preterm delivery in second trimester 06/22/2019  . Preterm labor with preterm delivery in second trimester   . 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 Hx of chlamydia infection 08/17/2018  . Hx of preeclampsia, prior pregnancy, currently pregnant, third trimester 08/17/2018  . Chronic hypertension 08/17/2018  . Depression 08/17/2018  . Vaginal bleeding in pregnancy, first trimester 03/26/2018    Past Surgical History:  Procedure Laterality Date  . CESAREAN SECTION N/A 06/22/2019   Procedure: CESAREAN SECTION;  Surgeon:  Woodroe Mode, MD;  Location: [facility] LD ORS;  Service: Obstetrics;  Laterality: N/A;  STAT Cord prolapse Liletta  . NO PAST SURGERIES    . WISDOM TOOTH EXTRACTION      OB History    Gravida  4   Para  4   Term  2   Preterm  2   AB      Living  4     SAB      IAB      Ectopic      Multiple  0   Live Births  4            Home Medications    Prior to Admission medications   Medication Sig Start Date End Date Taking? Authorizing Provider  acetaminophen (TYLENOL) 325 MG tablet Take 650 mg by mouth every 6 (six) hours as needed for mild pain or headache.    [provider]  Blood Pressure Monitoring (ADULT BLOOD PRESSURE CUFF LG) KIT 1 kit by Does not apply route daily. Call or return to the hospital if your blood pressure is running higher than 160 for the top number or 110 for the bottom number.  Check your blood pressure if you experience a headache that does not resolve or changes in your vision or other severe symptoms. 06/26/19   Pezzuto, Mickel Baas, MD  dicyclomine (BENTYL) 20 MG tablet Take 1 tablet (20 mg total) by mouth 2 (two) times daily  as needed for spasms. 05/19/20   Caccavale, Sophia, PA-C  ferrous sulfate 325 (65 FE) MG tablet Take 325 mg by mouth daily with breakfast.    [provider]  hydrochlorothiazide (HYDRODIURIL) 25 MG tablet Take 1 tablet (25 mg total) by mouth daily. 06/26/19   Debbrah Alar, MD  ibuprofen (ADVIL) 800 MG tablet Take 1 tablet (800 mg total) by mouth every 6 (six) hours. 06/26/19   Pezzuto, Mickel Baas, MD  lidocaine (LIDODERM) 5 % Place 1 patch onto the skin daily. Remove & Discard patch within 12 hours or as directed by MD 12/24/19   Henderly, Britni A, PA-C  metoCLOPramide (REGLAN) 10 MG tablet Take 1 tablet (10 mg total) by mouth every 8 (eight) hours as needed for nausea. 05/19/20   Caccavale, Sophia, PA-C  naproxen (NAPROSYN) 500 MG tablet Take 1 tablet (500 mg total) by mouth 2 (two) times daily. 12/24/19   Henderly, Britni A,  PA-C  NIFEdipine (ADALAT CC) 60 MG 24 hr tablet Take 1 tablet (60 mg total) by mouth daily. 06/26/19   Debbrah Alar, MD  ondansetron (ZOFRAN ODT) 8 MG disintegrating tablet 1/2- 1 tablet q 8 hr prn nausea, vomiting 05/08/20   Melynda Ripple, MD  oxyCODONE (OXY IR/ROXICODONE) 5 MG immediate release tablet Take 1 tablet (5 mg total) by mouth every 4 (four) hours as needed for severe pain. 06/26/19   Debbrah Alar, MD  Prenatal Vit-Fe Fumarate-FA (PRENATAL PO) Take 1 tablet by mouth daily.    [provider]    Family History Family History  Problem Relation Age of Onset  . Diabetes Other   . Healthy Mother   . Healthy Father     Social History Social History   Tobacco Use  . Smoking status: Current Some Day Smoker    Types: Cigarettes  . Smokeless tobacco: Never Used  . Tobacco comment: occ  Vaping Use  . Vaping Use: Never used  Substance Use Topics  . Alcohol use: Not Currently    Comment: occasionally   . Drug use: Not Currently    Types: Marijuana    Comment: "not since I got pregnant"     Allergies   Azithromycin and Other   Review of Systems Review of Systems  Genitourinary: Positive for menstrual problem.  All other systems reviewed and are negative.    Physical Exam Triage Vital Signs ED Triage Vitals  Enc Vitals Group     BP 06/22/20 1759 126/68     Pulse Rate 06/22/20 1759 96     Resp 06/22/20 1759 18     Temp 06/22/20 1759 98.6 F (37 C)     Temp Source 06/22/20 1759 Oral     SpO2 06/22/20 1759 96 %     Weight --      Height --      Head Circumference --      Peak Flow --      Pain Score 06/22/20 1758 0     Pain Loc --      Pain Edu? --      Excl. in [place]? --    No data found.  Updated Vital Signs BP 126/68 (BP Location: Right Arm)   Pulse 96   Temp 98.6 F (37 C) (Oral)   Resp 18   LMP 05/19/2020   SpO2 96%   Visual Acuity Right Eye Distance:   Left Eye Distance:   Bilateral Distance:    Right Eye Near:   Left Eye  Near:  Bilateral Near:     Physical Exam Vitals and nursing note reviewed.  Constitutional:      General: She is not in acute distress.    Appearance: Normal appearance. She is not ill-appearing, toxic-appearing or diaphoretic.  HENT:     Head: Normocephalic and atraumatic.  Eyes:     Conjunctiva/sclera: Conjunctivae normal.  Cardiovascular:     Rate and Rhythm: Normal rate.     Pulses: Normal pulses.  Pulmonary:     Effort: Pulmonary effort is normal.  Abdominal:     General: Abdomen is flat.  Genitourinary:    Comments: declines Musculoskeletal:        General: Normal range of motion.     Cervical back: Normal range of motion.  Skin:    General: Skin is warm and dry.  Neurological:     General: No focal deficit present.     Mental Status: She is alert and oriented to person, place, and time.  Psychiatric:        Mood and Affect: Mood normal.      UC Treatments / Results  Labs (all labs ordered are listed, but only abnormal results are displayed) Labs Reviewed  POCT URINALYSIS DIPSTICK, ED / UC - Abnormal; Notable for the following components:      Result Value   Ketones, ur TRACE (*)    Hgb urine dipstick LARGE (*)    Nitrite POSITIVE (*)    All other components within normal limits  URINE CULTURE  POC URINE PREG, ED    EKG   Radiology No results found.  Procedures Procedures (including critical care time)  Medications Ordered in UC Medications - No data to display  Initial Impression / Assessment and Plan / UC Course  I have reviewed the triage vital signs and the nursing notes.  Pertinent labs & imaging results that were available during my care of the patient were reviewed by me and considered in my medical decision making (see chart for details).     Assessment negative for red flags or concerns.  Urine pregnancy test was negative in office.  Urinalysis positive for ketones, hemoglobin, nitrite.  Urine culture is pending and will treat based  on results.  Dysmenorrhea likely a result of IUD removal in March.  Discussed that it can take several months for her body to regulate following stopping hormonal birth control methods.  Recommend following up with OB GYN for continued monitoring of dysmenorrhea. Final Clinical Impressions(s) / UC Diagnoses   Final diagnoses:  Dysmenorrhea  Negative pregnancy test     Discharge Instructions     We will contact you if your urine culture shows any signs of infection.    Follow up with your OB/GYN for any continued abnormal bleeding.    Return or go to the Emergency Department if symptoms worsen or do not improve in the next few days.      ED Prescriptions    None     PDMP not reviewed this encounter.   Pearson Forster, NP 06/22/20 1850

## 2020-06-22 NOTE — Discharge Instructions (Signed)
We will contact you if your urine culture shows any signs of infection.    Follow up with your OB/GYN for any continued abnormal bleeding.    Return or go to the Emergency Department if symptoms worsen or do not improve in the next few days.

## 2020-06-22 NOTE — ED Triage Notes (Signed)
Pt presents for possible pregnancy. 

## 2020-06-24 LAB — URINE CULTURE: Culture: 5000 — AB

## 2020-07-29 ENCOUNTER — Encounter (HOSPITAL_COMMUNITY): Payer: Self-pay

## 2020-07-29 ENCOUNTER — Ambulatory Visit (HOSPITAL_COMMUNITY)
Admission: RE | Admit: 2020-07-29 | Discharge: 2020-07-29 | Disposition: A | Discharge status: Home / Self Care | Payer: Medicaid Other | Source: Ambulatory Visit | Attending: Urgent Care | Admitting: Urgent Care

## 2020-07-29 ENCOUNTER — Other Ambulatory Visit: Payer: Self-pay

## 2020-07-29 VITALS — BP 138/84 | HR 88 | Temp 98.7°F | Resp 18

## 2020-07-29 DIAGNOSIS — N939 Abnormal uterine and vaginal bleeding, unspecified: Secondary | ICD-10-CM | POA: Diagnosis not present

## 2020-07-29 DIAGNOSIS — N3000 Acute cystitis without hematuria: Secondary | ICD-10-CM | POA: Insufficient documentation

## 2020-07-29 LAB — POC URINE PREG, ED: Preg Test, Ur: NEGATIVE

## 2020-07-29 LAB — POCT URINALYSIS DIPSTICK, ED / UC
Bilirubin Urine: NEGATIVE
Glucose, UA: NEGATIVE mg/dL
Hgb urine dipstick: NEGATIVE
Ketones, ur: NEGATIVE mg/dL
Nitrite: POSITIVE — AB
Protein, ur: NEGATIVE mg/dL
Specific Gravity, Urine: 1.025 (ref 1.005–1.030)
Urobilinogen, UA: 1 mg/dL (ref 0.0–1.0)
pH: 6.5 (ref 5.0–8.0)

## 2020-07-29 LAB — HCG, SERUM, QUALITATIVE: Preg, Serum: NEGATIVE

## 2020-07-29 MED ORDER — CEPHALEXIN 500 MG PO CAPS: 500.0000 mg | ORAL_CAPSULE | Freq: Two times a day (BID) | ORAL | 0 refills | Status: DC

## 2020-07-29 NOTE — Discharge Instructions (Addendum)

## 2020-07-29 NOTE — ED Triage Notes (Signed)
Pt is present today with possible pregnancy. Pt states that she took two pregnancy and she saw a faint positive. Pt states that she started spotting 06/2-06/5.

## 2020-07-29 NOTE — ED Provider Notes (Signed)
[place]   MRN: [id number] DOB: [birth year]  Subjective:   Margaret Ingram is a 25 y.o. female presenting for intermittent vaginal bleeding, spotting between 06/02-06/05.  This happened around the same time she has a cycle which was much lighter than she is used to.  She took 3 home pregnancy tests and all were faintly positive.  Patient is sexually active with 1 female partner, does not use condoms for protection.  She had STI testing late last month and does not want to repeat this.  Denies fever, nausea, vomiting, vaginal discharge, pelvic or abdominal pain, dysuria, urinary frequency.  No current facility-administered medications for this encounter.  Current Outpatient Medications:    acetaminophen (TYLENOL) 325 MG tablet, Take 650 mg by mouth every 6 (six) hours as needed for mild pain or headache., Disp: , Rfl:    Blood Pressure Monitoring (ADULT BLOOD PRESSURE CUFF LG) KIT, 1 kit by Does not apply route daily. Call or return to the hospital if your blood pressure is running higher than 160 for the top number or 110 for the bottom number.  Check your blood pressure if you experience a headache that does not resolve or changes in your vision or other severe symptoms., Disp: 1 kit, Rfl: 0   dicyclomine (BENTYL) 20 MG tablet, Take 1 tablet (20 mg total) by mouth 2 (two) times daily as needed for spasms., Disp: 20 tablet, Rfl: 0   ferrous sulfate 325 (65 FE) MG tablet, Take 325 mg by mouth daily with breakfast., Disp: , Rfl:    hydrochlorothiazide (HYDRODIURIL) 25 MG tablet, Take 1 tablet (25 mg total) by mouth daily., Disp: 30 tablet, Rfl: 2   ibuprofen (ADVIL) 800 MG tablet, Take 1 tablet (800 mg total) by mouth every 6 (six) hours., Disp: 30 tablet, Rfl: 0   lidocaine (LIDODERM) 5 %, Place 1 patch onto the skin daily. Remove & Discard patch within 12 hours or as directed by MD, Disp: 30 patch, Rfl: 0   metoCLOPramide (REGLAN) 10 MG tablet, Take 1 tablet (10 mg total) by  mouth every 8 (eight) hours as needed for nausea., Disp: 30 tablet, Rfl: 0   naproxen (NAPROSYN) 500 MG tablet, Take 1 tablet (500 mg total) by mouth 2 (two) times daily., Disp: 30 tablet, Rfl: 0   NIFEdipine (ADALAT CC) 60 MG 24 hr tablet, Take 1 tablet (60 mg total) by mouth daily., Disp: 30 tablet, Rfl: 2   ondansetron (ZOFRAN ODT) 8 MG disintegrating tablet, 1/2- 1 tablet q 8 hr prn nausea, vomiting, Disp: 20 tablet, Rfl: 0   oxyCODONE (OXY IR/ROXICODONE) 5 MG immediate release tablet, Take 1 tablet (5 mg total) by mouth every 4 (four) hours as needed for severe pain., Disp: 15 tablet, Rfl: 0   Prenatal Vit-Fe Fumarate-FA (PRENATAL PO), Take 1 tablet by mouth daily., Disp: , Rfl:    Allergies  Allergen Reactions   Azithromycin Nausea And Vomiting and Nausea Only   Other Rash    *Sid Falcon*    Past Medical History:  Diagnosis Date   Anxiety    Depression    with pregnancies   Diabetes mellitus without complication ([place])    pre diabetic   Fibroid    Heart murmur    Hypertension    chronic   Infection    UTI   Pregnancy induced hypertension    Preterm contractions 06/12/2017   Seizures (HCC)    anxiety     Past Surgical History:  Procedure  Laterality Date   CESAREAN SECTION N/A 06/22/2019   Procedure: CESAREAN SECTION;  Surgeon: Woodroe Mode, MD;  Location: [facility];  Service: Obstetrics;  Laterality: N/A;  STAT Cord prolapse Liletta   NO PAST SURGERIES     WISDOM TOOTH EXTRACTION      Family History  Problem Relation Age of Onset   Diabetes Other    Healthy Mother    Healthy Father     Social History   Tobacco Use   Smoking status: Some Days    Pack years: 0.00    Types: Cigarettes   Smokeless tobacco: Never   Tobacco comments:    occ  Vaping Use   Vaping Use: Never used  Substance Use Topics   Alcohol use: Not Currently    Comment: occasionally    Drug use: Not Currently    Types: Marijuana    Comment: "not since I got pregnant"     ROS   Objective:   Vitals: BP 138/84   Pulse 88   Temp 98.7 F (37.1 C)   Resp 18   SpO2 99%   Physical Exam Constitutional:      General: She is not in acute distress.    Appearance: Normal appearance. She is well-developed. She is obese. She is not ill-appearing, toxic-appearing or diaphoretic.  HENT:     Head: Normocephalic and atraumatic.     Right Ear: External ear normal.     Left Ear: External ear normal.     Nose: Nose normal.     Mouth/Throat:     Mouth: Mucous membranes are moist.     Pharynx: Oropharynx is clear.  Eyes:     General: No scleral icterus.    Extraocular Movements: Extraocular movements intact.     Pupils: Pupils are equal, round, and reactive to light.  Cardiovascular:     Rate and Rhythm: Normal rate and regular rhythm.     Heart sounds: Normal heart sounds. No murmur heard.   No friction rub. No gallop.  Pulmonary:     Effort: Pulmonary effort is normal. No respiratory distress.     Breath sounds: Normal breath sounds. No stridor. No wheezing, rhonchi or rales.  Abdominal:     General: Bowel sounds are normal. There is no distension.     Palpations: Abdomen is soft. There is no mass.     Tenderness: There is no abdominal tenderness. There is no right CVA tenderness, left CVA tenderness, guarding or rebound.  Skin:    General: Skin is warm and dry.     Coloration: Skin is not pale.     Findings: No rash.  Neurological:     General: No focal deficit present.     Mental Status: She is alert and oriented to person, place, and time.  Psychiatric:        Mood and Affect: Mood normal.        Behavior: Behavior normal.        Thought Content: Thought content normal.        Judgment: Judgment normal.    Results for orders placed or performed during the hospital encounter of 07/29/20 (from the past 24 hour(s))  POCT Urinalysis Dipstick (ED/UC)     Status: Abnormal   Collection Time: 07/29/20  5:30 PM  Result Value Ref Range   Glucose,  UA NEGATIVE NEGATIVE mg/dL   Bilirubin Urine NEGATIVE NEGATIVE   Ketones, ur NEGATIVE NEGATIVE mg/dL   Specific Gravity, Urine 1.025 1.005 -  1.030   Hgb urine dipstick NEGATIVE NEGATIVE   pH 6.5 5.0 - 8.0   Protein, ur NEGATIVE NEGATIVE mg/dL   Urobilinogen, UA 1.0 0.0 - 1.0 mg/dL   Nitrite POSITIVE (A) NEGATIVE   Leukocytes,Ua TRACE (A) NEGATIVE  POC urine preg, ED (not at [facility])     Status: None   Collection Time: 07/29/20  5:35 PM  Result Value Ref Range   Preg Test, Ur NEGATIVE NEGATIVE    Assessment and Plan :   PDMP not reviewed this encounter.  1. Acute cystitis without hematuria   2. Vaginal bleeding     Will confirm negative pregnancy through the beta-hCG.  Urine culture pending.  Patient is agreeable to treatment for acute cystitis with Keflex. Counseled patient on potential for adverse effects with medications prescribed/recommended today, ER and return-to-clinic precautions discussed, patient verbalized understanding.    Jaynee Eagles, [place] 07/29/20 [id number]

## 2020-08-01 LAB — URINE CULTURE: Culture: >=100000 — AB

## 2020-08-15 ENCOUNTER — Ambulatory Visit (HOSPITAL_COMMUNITY): Payer: Self-pay

## 2020-10-20 HISTORY — PX: CERVICAL CERCLAGE: SHX1329

## 2021-04-05 DIAGNOSIS — O1410 Severe pre-eclampsia, unspecified trimester: Secondary | ICD-10-CM

## 2021-06-05 LAB — CYTOLOGY - PAP: Pap: NEGATIVE

## 2021-07-09 IMAGING — CT CT ABD-PELV W/ CM
2 of 4 series · 17 of 46 positions shown, 19 images · IV contrast (omnipaque)
Comparison: None.

CLINICAL DATA: Nausea and vomiting

EXAM:
CT ABDOMEN AND PELVIS WITH CONTRAST
TECHNIQUE: Multidetector CT imaging of the abdomen and pelvis was performed
using the standard protocol following bolus administration of
intravenous contrast.
CONTRAST:  100mL OMNIPAQUE IOHEXOL 300 MG/ML  SOLN

[Series 2: axial st · axial · 0.98mm/px · z∈[+1086,+1462]mm · 14 of 85 slices shown, 16 images]
[im 5/85  soft-tissue]
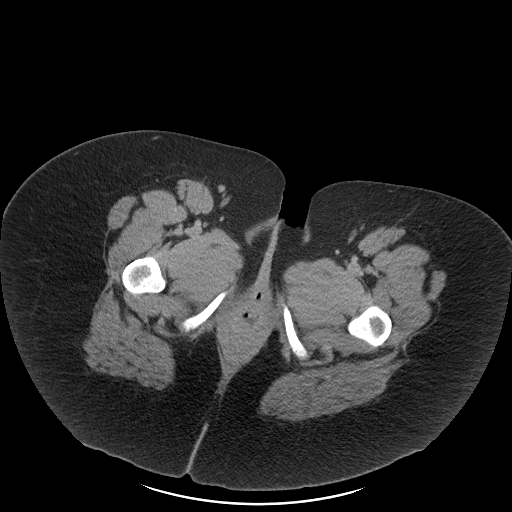
[im 5/85  bone]
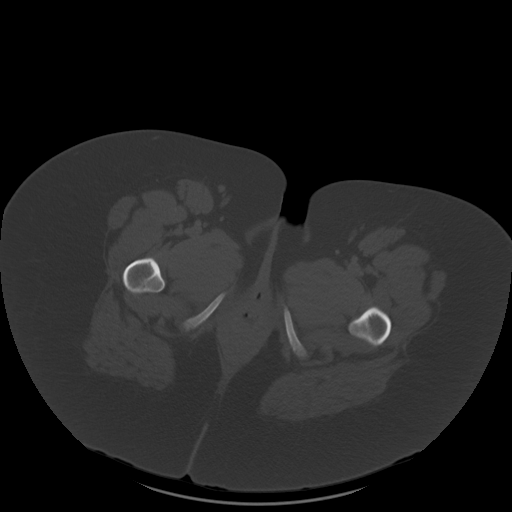
[im 9/85  soft-tissue]
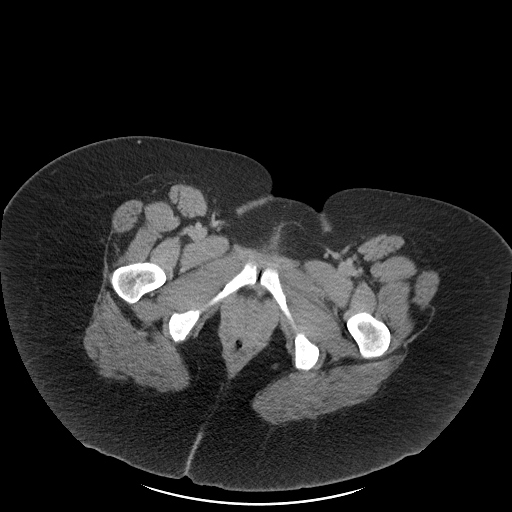
[im 18/85  soft-tissue]
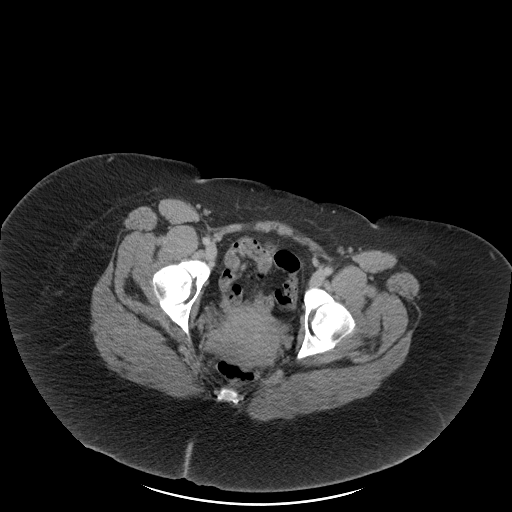
[im 23/85  soft-tissue]
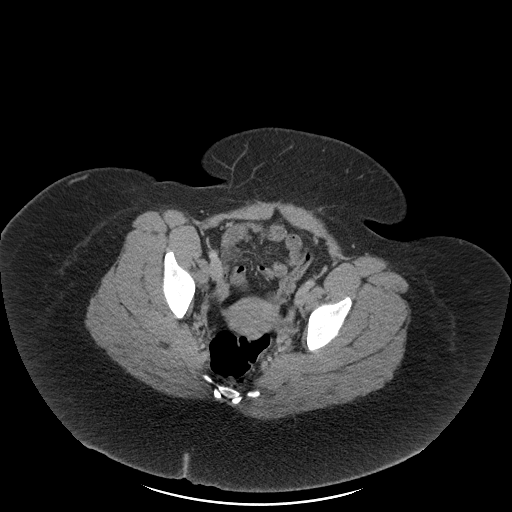
[im 27/85  soft-tissue]
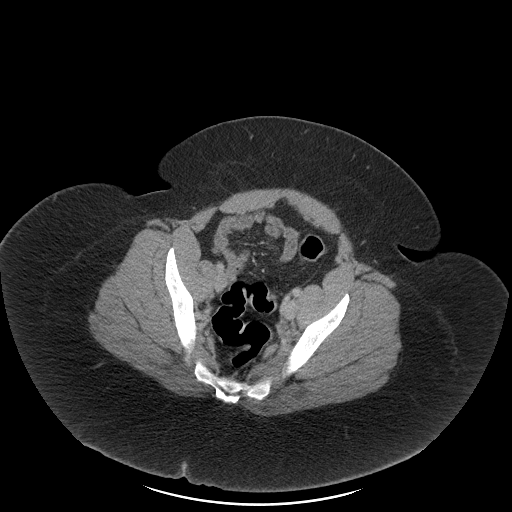
[im 36/85  soft-tissue]
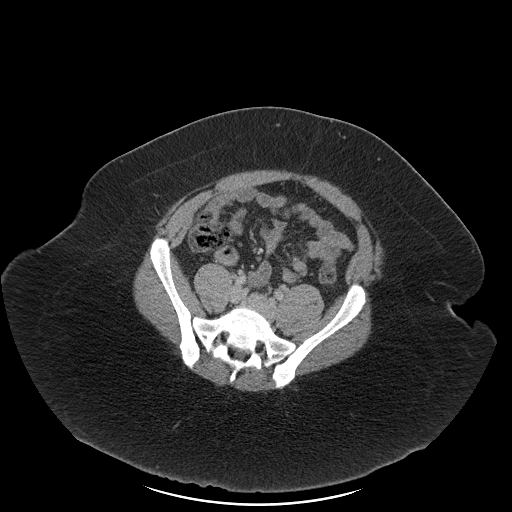
[im 40/85  soft-tissue]
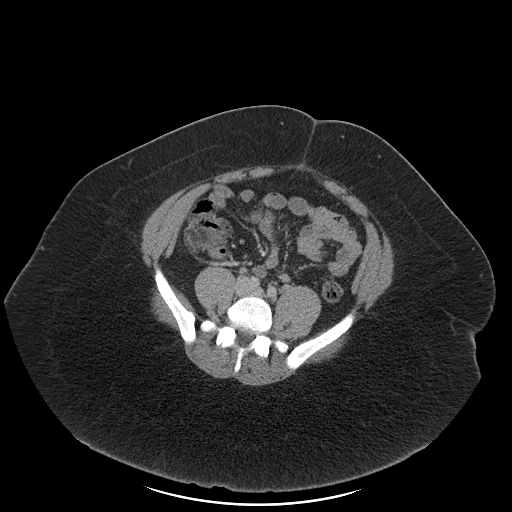
[im 45/85  soft-tissue]
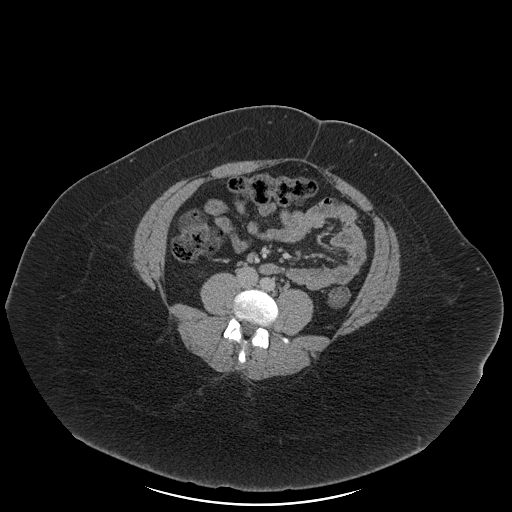
[im 49/85  soft-tissue]
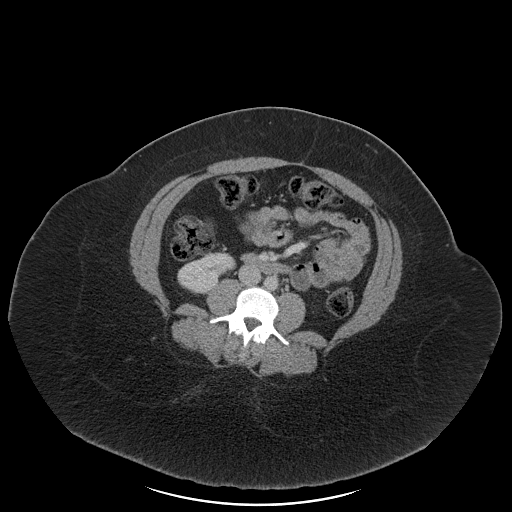
[im 49/85  bone]
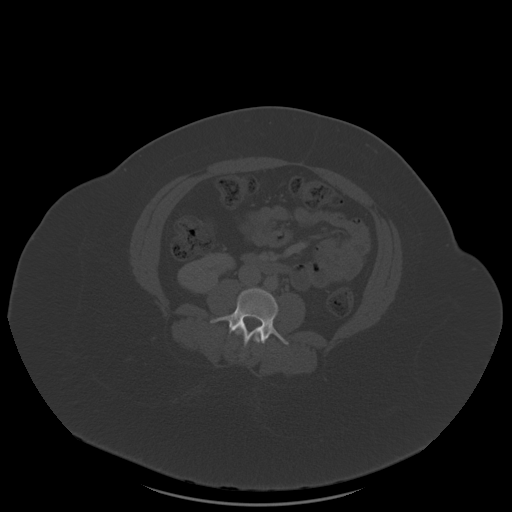
[im 58/85  soft-tissue]
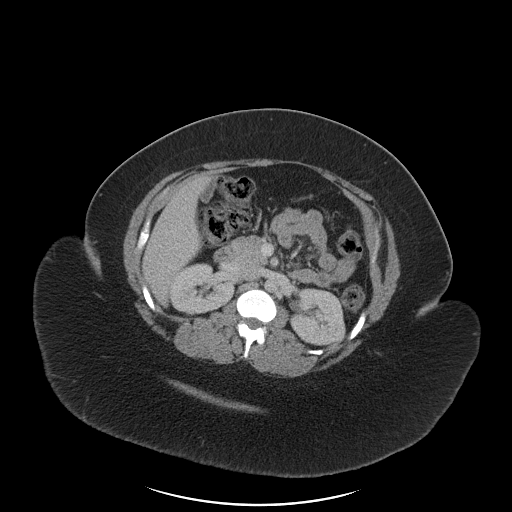
[im 62/85  soft-tissue]
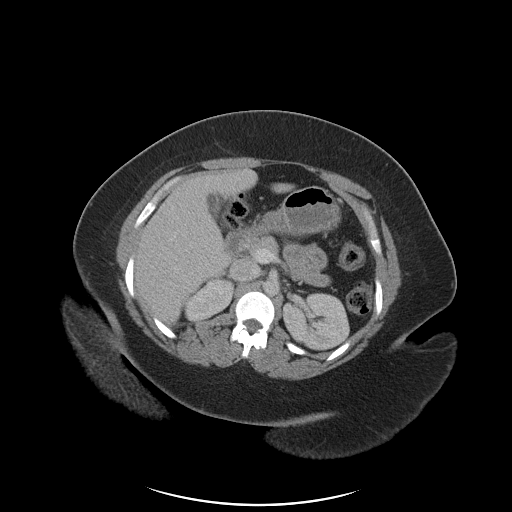
[im 67/85  soft-tissue]
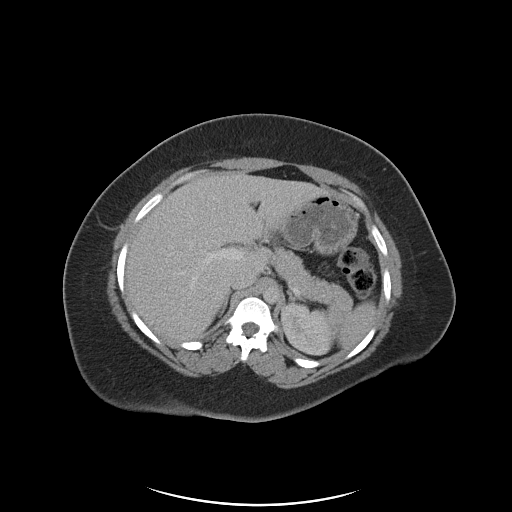
[im 76/85  soft-tissue]
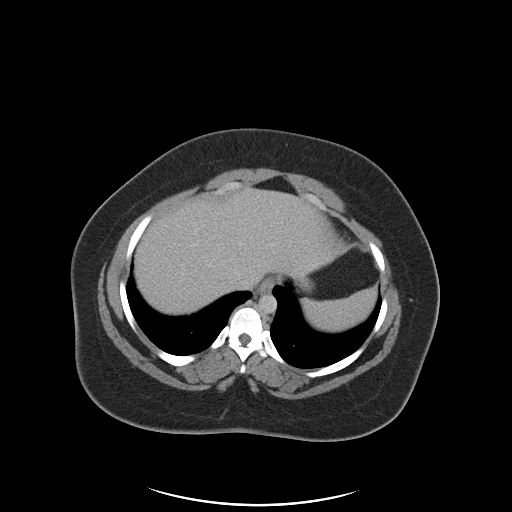
[im 80/85  soft-tissue]
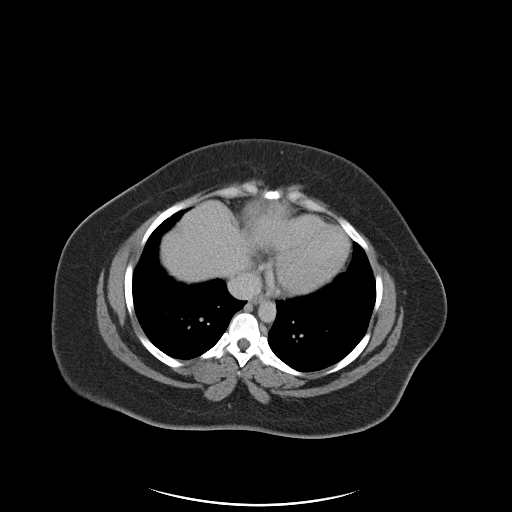

[Series 5: coronal st · coronal · 0.93mm/px · 3 of 141 slices shown]
[im 47/141  soft-tissue]
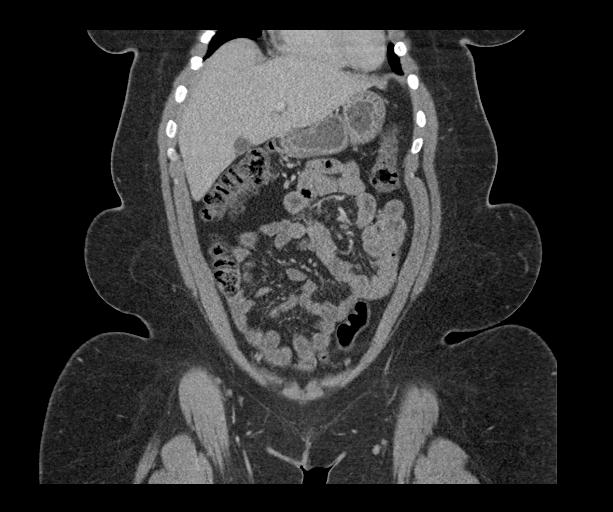
[im 63/141  soft-tissue]
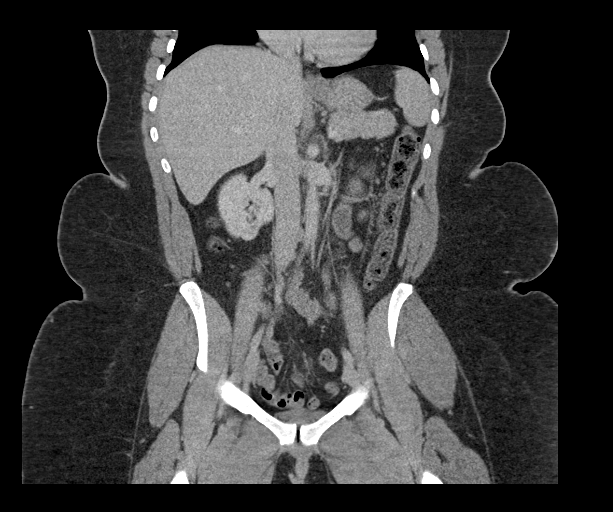
[im 78/141  soft-tissue]
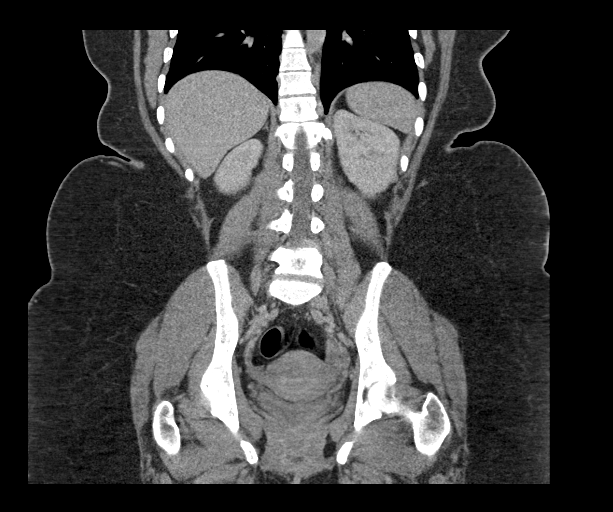

[17 of 46 positions shown; findings below may reference images not displayed]

FINDINGS: Lower chest: No acute abnormality.

Hepatobiliary: No focal liver abnormality is seen. No gallstones,
gallbladder wall thickening, or biliary dilatation.

Pancreas: Unremarkable. No pancreatic ductal dilatation or
surrounding inflammatory changes.

Spleen: Normal in size without focal abnormality.

Adrenals/Urinary Tract: Adrenal glands are unremarkable. Kidneys are
normal, without renal calculi, focal lesion, or hydronephrosis.
Bladder is unremarkable.

Stomach/Bowel: Stomach is within normal limits. Appendix appears
normal. No evidence of bowel wall thickening, distention, or
inflammatory changes.

Vascular/Lymphatic: No significant vascular findings are present. No
enlarged abdominal or pelvic lymph nodes.

Reproductive: Uterus and bilateral adnexa are unremarkable.

Other: Small fat containing umbilical hernia. No abdominopelvic
ascites.

Musculoskeletal: No acute or significant osseous findings.
IMPRESSION: Negative. No CT evidence for acute intra-abdominal or pelvic
abnormality.

## 2022-11-26 ENCOUNTER — Inpatient Hospital Stay (HOSPITAL_COMMUNITY): Payer: MEDICAID

## 2022-11-26 ENCOUNTER — Other Ambulatory Visit: Payer: Self-pay

## 2022-11-26 ENCOUNTER — Inpatient Hospital Stay (HOSPITAL_COMMUNITY)
Admission: AD | Admit: 2022-11-26 | Discharge: 2022-11-26 | Disposition: A | Discharge status: Home / Self Care | Payer: MEDICAID | Attending: Obstetrics and Gynecology | Admitting: Obstetrics and Gynecology

## 2022-11-26 ENCOUNTER — Encounter (HOSPITAL_COMMUNITY): Payer: Self-pay | Admitting: Obstetrics and Gynecology

## 2022-11-26 DIAGNOSIS — O26891 Other specified pregnancy related conditions, first trimester: Secondary | ICD-10-CM | POA: Insufficient documentation

## 2022-11-26 DIAGNOSIS — O10919 Unspecified pre-existing hypertension complicating pregnancy, unspecified trimester: Secondary | ICD-10-CM | POA: Diagnosis present

## 2022-11-26 DIAGNOSIS — O209 Hemorrhage in early pregnancy, unspecified: Secondary | ICD-10-CM | POA: Diagnosis not present

## 2022-11-26 DIAGNOSIS — Z3A11 11 weeks gestation of pregnancy: Secondary | ICD-10-CM | POA: Diagnosis not present

## 2022-11-26 HISTORY — DX: Unspecified pre-existing hypertension complicating pregnancy, unspecified trimester: O10.919

## 2022-11-26 LAB — WET PREP, GENITAL
Clue Cells Wet Prep HPF POC: NONE SEEN
Sperm: NONE SEEN
Trich, Wet Prep: NONE SEEN
WBC, Wet Prep HPF POC: <10 (ref <10)
Yeast Wet Prep HPF POC: NONE SEEN

## 2022-11-26 LAB — URINALYSIS, ROUTINE W REFLEX MICROSCOPIC
Bacteria, UA: NONE SEEN
Bilirubin Urine: NEGATIVE
Glucose, UA: NEGATIVE mg/dL
Ketones, ur: NEGATIVE mg/dL
Leukocytes,Ua: NEGATIVE
Nitrite: NEGATIVE
Protein, ur: 30 mg/dL — AB
RBC / HPF: >50 RBC/hpf (ref 0–5)
Specific Gravity, Urine: 1.025 (ref 1.005–1.030)
pH: 6 (ref 5.0–8.0)

## 2022-11-26 LAB — CBC
HCT: 33.3 % — ABNORMAL LOW (ref 36.0–46.0)
Hemoglobin: 11.1 g/dL — ABNORMAL LOW (ref 12.0–15.0)
MCH: 28.4 pg (ref 26.0–34.0)
MCHC: 33.3 g/dL (ref 30.0–36.0)
MCV: 85.2 fL (ref 80.0–100.0)
Platelets: 178 10*3/uL (ref 150–400)
RBC: 3.91 MIL/uL (ref 3.87–5.11)
RDW: 11.9 % (ref 11.5–15.5)
WBC: 5.1 10*3/uL (ref 4.0–10.5)
nRBC: 0 % (ref 0.0–0.2)

## 2022-11-26 LAB — GC/CHLAMYDIA PROBE AMP (~~LOC~~) NOT AT ARMC
Chlamydia: NEGATIVE
Comment: NEGATIVE
Comment: NORMAL
Neisseria Gonorrhea: NEGATIVE

## 2022-11-26 LAB — HCG, QUANTITATIVE, PREGNANCY: hCG, Beta Chain, Quant, S: 114371 m[IU]/mL — ABNORMAL HIGH (ref <5)

## 2022-11-26 LAB — HIV ANTIBODY (ROUTINE TESTING W REFLEX): HIV Screen 4th Generation wRfx: NONREACTIVE

## 2022-11-26 MED ORDER — PRENATAL 27-0.8 MG PO TABS
1.0000 | ORAL_TABLET | Freq: Every day | ORAL | 0 refills | Status: AC
Start: 2022-11-26 — End: ?

## 2022-11-26 MED ORDER — NIFEDIPINE ER 30 MG PO TB24: 60.0000 mg | ORAL_TABLET | Freq: Every day | ORAL | 0 refills | Status: DC

## 2022-11-26 MED ORDER — ONDANSETRON 8 MG PO TBDP
ORAL_TABLET | ORAL | 0 refills | Status: DC
Start: 2022-11-26 — End: 2023-03-06

## 2022-11-26 NOTE — MAU Note (Signed)
11/26/2022  Margaret Ingram DOB: 07/22/1995 MRN: [id number]   RIDER WAIVER AND RELEASE OF LIABILITY  For the purposes of helping with transportation needs, [facility] partners with outside transportation providers (taxi companies, [place], Catering manager.) to give [employer] patients or other approved people the choice of on-demand rides [employer]") to our buildings for non-emergency visits.  By using [employer], I, the person signing this document, on behalf of myself and/or any legal minors (in my care using the [employer]), agree:  Science writer given to me are supplied by independent, outside transportation providers who do not work for, or have any affiliation with, [employer]. [facility] is not a transportation company. [facility] has no control over the quality or safety of the rides I get using [employer]. Portal has no control over whether any outside ride will happen on time or not. [facility] gives no guarantee on the reliability, quality, safety, or availability on any rides, or that no mistakes will happen. I know and accept that traveling by vehicle (car, truck, SVU, Zenaida Niece, bus, taxi, etc.) has risks of serious injuries such as disability, being paralyzed, and death. I know and agree the risk of using [employer] is mine alone, and not [employer]. Transport Services are provided "as is" and as are available. The transportation providers are in charge for all inspections and care of the vehicles used to provide these rides. I agree not to take legal action against [facility], its agents, employees, officers, directors, representatives, insurers, attorneys, assigns, successors, subsidiaries, and affiliates at any time for any reasons related directly or indirectly to using [employer]. I also agree not to take legal action against [facility] or its affiliates for any injury, death, or damage to property caused by or related to using  [employer]. I have read this Waiver and Release of Liability, and I understand the terms used in it and their legal meaning. This Waiver is freely and voluntarily given with the understanding that my right (or any legal minors) to legal action against [place] relating to [employer] is knowingly given up to use these services.   I attest that I read the Ride Waiver and Release of Liability to [name], gave Ms. Bucio the opportunity to ask questions and answered the questions asked (if any). I affirm that [place] then provided consent for assistance with transportation.

## 2022-11-26 NOTE — MAU Note (Signed)
.  Margaret Ingram is a 27 y.o. at [redacted]w[redacted]d here in MAU reporting: about an hour ago was laying down and noticed she was having vaginal bleeding "it was just everywhere. I was just fine" - reports sharp pain in vagina that comes and goes. States earlier she was dizzy and lightheaded but didn't think anything of it. Not currently wearing a pad. Denies recent intercourse   Onset of complaint: 0340 Pain score: 6 - constant  Vitals:   11/26/22 0445  BP: (!) 148/83  Pulse: 79  Resp: 19  Temp: 98.1 F (36.7 C)  SpO2: 98%     FHT:NA  Lab orders placed from triage:  UA

## 2022-11-26 NOTE — MAU Provider Note (Signed)
History     CSN: [id number]  Arrival date and time: 11/26/22 0424   None     Chief Complaint  Patient presents with   Vaginal Bleeding   HPI Margaret Ingram is a 27 y.o. year old [id number] female at [redacted]w[redacted]d weeks gestation who presents to MAU reporting at 4 AM this morning she noticed there was vaginal blood along the inside of her thighs and all over the bed.  She states "it was just everywhere."  She reports she has had intermittent sharp pains in her vagina that she rates a 6 out of 10.  She reports earlier today she had some dizziness and lightheadedness, but she did not think anything of it.  She denies any recent sexual intercourse.  She was not wearing a pad when she got here but reports that the blood was on her pants.  She states, "I mainly see the bleeding with wiping."  Her pregnancy is complicated by obesity, chronic hypertension, history of preeclampsia in a previous pregnancy, a history of preterm labor with preterm delivery in a previous pregnancy, and a history of a prolapsed cord and a previous labor and delivery.  She receives [facility]; next appt is 11/28/2022.   OB History     Gravida  6   Para  5   Term  2   Preterm  3   AB      Living  4      SAB      IAB      Ectopic      Multiple  0   Live Births  5           Past Medical History:  Diagnosis Date   Anxiety    Depression    with pregnancies   Diabetes mellitus without complication (HCC)    pre diabetic   Fibroid    Heart murmur    Hypertension    chronic   Infection    UTI   Pregnancy induced hypertension    Preterm contractions 06/12/2017   Seizures (HCC)    anxiety    Past Surgical History:  Procedure Laterality Date   CESAREAN SECTION N/A 06/22/2019   Procedure: CESAREAN SECTION;  Surgeon: Adam Phenix, MD;  Location: [facility]S;  Service: Obstetrics;  Laterality: N/A;  STAT Cord prolapse Liletta   NO PAST SURGERIES     WISDOM TOOTH  EXTRACTION      Family History  Problem Relation Age of Onset   Diabetes Other    Healthy Mother    Healthy Father     Social History   Tobacco Use   Smoking status: Never   Smokeless tobacco: Never   Tobacco comments:    occ  Vaping Use   Vaping status: Never Used  Substance Use Topics   Alcohol use: Not Currently    Comment: occasionally    Drug use: Not Currently    Types: Marijuana    Comment: "not since I got pregnant"    Allergies:  Allergies  Allergen Reactions   Azithromycin Nausea And Vomiting and Nausea Only   Other Rash    *Brown Band-Aid*    Medications Prior to Admission  Medication Sig Dispense Refill Last Dose   Prenatal Vit-Fe Fumarate-FA (PRENATAL PO) Take 1 tablet by mouth daily.   11/26/2022   acetaminophen (TYLENOL) 325 MG tablet Take 650 mg by mouth every 6 (six) hours as needed for mild pain or  headache.      Blood Pressure Monitoring (ADULT BLOOD PRESSURE CUFF LG) KIT 1 kit by Does not apply route daily. Call or return to the hospital if your blood pressure is running higher than 160 for the top number or 110 for the bottom number.  Check your blood pressure if you experience a headache that does not resolve or changes in your vision or other severe symptoms. 1 kit 0    cephALEXin (KEFLEX) 500 MG capsule Take 1 capsule (500 mg total) by mouth 2 (two) times daily. 10 capsule 0    dicyclomine (BENTYL) 20 MG tablet Take 1 tablet (20 mg total) by mouth 2 (two) times daily as needed for spasms. 20 tablet 0    ferrous sulfate 325 (65 FE) MG tablet Take 325 mg by mouth daily with breakfast.      hydrochlorothiazide (HYDRODIURIL) 25 MG tablet Take 1 tablet (25 mg total) by mouth daily. 30 tablet 2    ibuprofen (ADVIL) 800 MG tablet Take 1 tablet (800 mg total) by mouth every 6 (six) hours. 30 tablet 0    lidocaine (LIDODERM) 5 % Place 1 patch onto the skin daily. Remove & Discard patch within 12 hours or as directed by MD 30 patch 0    metoCLOPramide  (REGLAN) 10 MG tablet Take 1 tablet (10 mg total) by mouth every 8 (eight) hours as needed for nausea. 30 tablet 0    naproxen (NAPROSYN) 500 MG tablet Take 1 tablet (500 mg total) by mouth 2 (two) times daily. 30 tablet 0    NIFEdipine (ADALAT CC) 60 MG 24 hr tablet Take 1 tablet (60 mg total) by mouth daily. 30 tablet 2    ondansetron (ZOFRAN ODT) 8 MG disintegrating tablet 1/2- 1 tablet q 8 hr prn nausea, vomiting 20 tablet 0    oxyCODONE (OXY IR/ROXICODONE) 5 MG immediate release tablet Take 1 tablet (5 mg total) by mouth every 4 (four) hours as needed for severe pain. 15 tablet 0     Review of Systems  Constitutional:  Positive for diaphoresis.  HENT: Negative.    Eyes: Negative.   Respiratory: Negative.    Cardiovascular: Negative.   Gastrointestinal: Negative.   Endocrine: Negative.   Genitourinary:  Positive for vaginal bleeding and vaginal pain (sharp and intermittent).  Musculoskeletal: Negative.   Skin: Negative.   Allergic/Immunologic: Negative.   Neurological: Negative.   Hematological: Negative.   Psychiatric/Behavioral: Negative.     Physical Exam   Blood pressure (!) 148/83, pulse 79, temperature 98.1 F (36.7 C), temperature source Oral, resp. rate 19, height 5\' 2"  (1.575 m), weight 126.5 kg, SpO2 98%, not currently breastfeeding.  Physical Exam Vitals and nursing note reviewed. Exam conducted with a chaperone present.  Constitutional:      Appearance: Normal appearance. She is obese.  Cardiovascular:     Rate and Rhythm: Normal rate.  Pulmonary:     Effort: Pulmonary effort is normal.  Abdominal:     Palpations: Abdomen is soft.  Genitourinary:    General: Normal vulva.     Comments: Pelvic exam: External genitalia normal, SE: vaginal walls pink and well rugated, cervix is smooth, pink, no lesions, small amt of dark, brownish, red blood on introitus and in vaginal vault -- WP, GC/CT done, Uterus is non-tender, cervix: closed/thick/firm, no CMT or friability,  mild LT adnexal tenderness.  Musculoskeletal:        General: Normal range of motion.  Skin:    General: Skin is warm  and dry.  Neurological:     Mental Status: She is alert and oriented to person, place, and time.  Psychiatric:        Mood and Affect: Mood normal.        Behavior: Behavior normal.        Thought Content: Thought content normal.        Judgment: Judgment normal.    MAU Course  Procedures  MDM CCUA UPT CBC ABO/Rh -- not drawn, known A POS HCG OB U/S < 14 wks TVUS  Recent Results (from the past 2160 hour(s))  GC/Chlamydia probe amp ([place])not at [facility]     Status: None   Collection Time: 11/26/22  4:59 AM  Result Value Ref Range   Neisseria Gonorrhea Negative    Chlamydia Negative    Comment Normal Reference [place] Chlamydia - Negative    Comment      Normal Reference Range Neisseria Gonorrhea - Negative  Wet prep, genital     Status: None   Collection Time: 11/26/22  5:09 AM  Result Value Ref Range   Yeast Wet Prep HPF POC NONE SEEN NONE SEEN   Trich, Wet Prep NONE SEEN NONE SEEN   Clue Cells Wet Prep HPF POC NONE SEEN NONE SEEN   WBC, Wet Prep HPF POC <10 <10   Sperm NONE SEEN     Comment: Performed at [facility], [address] [place], [place] [address]  Urinalysis, Routine w reflex microscopic -Urine, Clean Catch     Status: Abnormal   Collection Time: 11/26/22  5:10 AM  Result Value Ref Range   Color, Urine YELLOW YELLOW   APPearance CLEAR CLEAR   Specific Gravity, Urine 1.025 1.005 - 1.030   pH 6.0 5.0 - 8.0   Glucose, UA NEGATIVE NEGATIVE mg/dL   Hgb urine dipstick LARGE (A) NEGATIVE   Bilirubin Urine NEGATIVE NEGATIVE   Ketones, ur NEGATIVE NEGATIVE mg/dL   Protein, ur 30 (A) NEGATIVE mg/dL   Nitrite NEGATIVE NEGATIVE   Leukocytes,Ua NEGATIVE NEGATIVE   RBC / HPF >50 0 - 5 RBC/hpf   WBC, UA 0-5 0 - 5 WBC/hpf   Bacteria, UA NONE SEEN NONE SEEN   Squamous Epithelial / HPF 0-5 0 - 5 /HPF   Mucus PRESENT     Comment:  Performed at [facility], [address] [place], [place] [address]  CBC     Status: Abnormal   Collection Time: 11/26/22  5:36 AM  Result Value Ref Range   WBC 5.1 4.0 - 10.5 K/uL   RBC 3.91 3.87 - 5.11 MIL/uL   Hemoglobin 11.1 (L) 12.0 - 15.0 g/dL   HCT 24.4 (L) 01.0 - 27.2 %   MCV 85.2 80.0 - 100.0 fL   MCH 28.4 26.0 - 34.0 pg   MCHC 33.3 30.0 - 36.0 g/dL   RDW 53.6 64.4 - 03.4 %   Platelets 178 150 - 400 K/uL   nRBC 0.0 0.0 - 0.2 %    Comment: Performed at [facility], [address], [place], [place] [address]  hCG, quantitative, pregnancy     Status: Abnormal   Collection Time: 11/26/22  5:36 AM  Result Value Ref Range   hCG, Beta Chain, Quant, S 114,371 (H) <5 mIU/mL    Comment:          GEST. AGE      CONC.  (mIU/mL)   <=1 WEEK  5 - 50     2 WEEKS       50 - 500     3 WEEKS       100 - 10,000     4 WEEKS     1,000 - 30,000     5 WEEKS     3,500 - 115,000   6-8 WEEKS     12,000 - 270,000    12 WEEKS     15,000 - 220,000        FEMALE AND NON-PREGNANT FEMALE:     LESS THAN 5 mIU/mL Performed at [facility], [address], [place], [place] [address]   HIV Antibody (routine testing w rflx)     Status: None   Collection Time: 11/26/22  5:36 AM  Result Value Ref Range   HIV Screen 4th Generation wRfx Non Reactive Non Reactive    Comment: Performed at [facility], [address] [place], [place] [address]   US OB LESS THAN 14 WEEKS WITH OB TRANSVAGINAL  Result Date: 11/26/2022 CLINICAL DATA:  Initial evaluation for acute vaginal bleeding, early pregnancy. EXAM: OBSTETRIC <14 WK [place] AND TRANSVAGINAL OB US TECHNIQUE: Both transabdominal and transvaginal ultrasound examinations were performed for complete evaluation of the gestation as well as the maternal uterus, adnexal regions, and pelvic cul-de-sac. Transvaginal technique was performed to assess early pregnancy. COMPARISON:  None available. FINDINGS: Intrauterine gestational sac: Single  Yolk sac:  Negative Embryo:  Present Cardiac Activity: Present Heart Rate: 158 bpm CRL:  49.3 mm   11 w   5 d                  [place] EDC: 06/12/2023 Subchorionic hemorrhage:  None visualized. Maternal uterus/adnexae: Neither ovary is visualized. No adnexal mass or free fluid. IMPRESSION: 1. Single viable IUP, estimated gestational age [redacted] weeks and 5 days by crown-rump length, with ultrasound EDC of 06/12/2023. No complication. 2. No other acute maternal uterine or adnexal abnormality. Electronically Signed   By: Rise Mu M.D.   On: 11/26/2022 06:17     Assessment and Plan  1. Vaginal bleeding in pregnancy, first trimester - Information provided on vaginal bleeding in first trimester pregnancy  2. [redacted] weeks gestation of pregnancy   - Discharge patient -Keep scheduled appointments with [facility] - Patient verbalized an understanding of the plan of care and agrees.    Raelyn Mora, CNM 11/26/2022, 4:59 AM

## 2022-12-12 LAB — OB RESULTS CONSOLE TSH: TSH: 2.34

## 2022-12-12 LAB — HEPATITIS C ANTIBODY: HCV Ab: NEGATIVE

## 2022-12-12 LAB — OB RESULTS CONSOLE RPR: RPR: NONREACTIVE

## 2022-12-12 LAB — OB RESULTS CONSOLE RUBELLA ANTIBODY, IGM: Rubella: IMMUNE

## 2022-12-12 LAB — OB RESULTS CONSOLE HIV ANTIBODY (ROUTINE TESTING): HIV: NONREACTIVE

## 2022-12-12 LAB — OB RESULTS CONSOLE HEPATITIS B SURFACE ANTIGEN: Hepatitis B Surface Ag: NEGATIVE

## 2023-01-28 HISTORY — PX: CERVICAL CERCLAGE: SHX1329

## 2023-01-28 LAB — OB RESULTS CONSOLE ABO/RH: RH Type: POSITIVE

## 2023-01-28 LAB — OB RESULTS CONSOLE HGB/HCT, BLOOD
HCT: 33 (ref 29–41)
Hemoglobin: 11.6

## 2023-01-28 LAB — OB RESULTS CONSOLE PLATELET COUNT: Platelets: 142

## 2023-01-28 LAB — OB RESULTS CONSOLE ANTIBODY SCREEN: Antibody Screen: NEGATIVE

## 2023-02-19 ENCOUNTER — Telehealth: Payer: MEDICAID

## 2023-02-19 DIAGNOSIS — O10912 Unspecified pre-existing hypertension complicating pregnancy, second trimester: Secondary | ICD-10-CM

## 2023-02-19 DIAGNOSIS — O10919 Unspecified pre-existing hypertension complicating pregnancy, unspecified trimester: Secondary | ICD-10-CM

## 2023-02-19 DIAGNOSIS — Z3A23 23 weeks gestation of pregnancy: Secondary | ICD-10-CM

## 2023-02-19 DIAGNOSIS — O0990 Supervision of high risk pregnancy, unspecified, unspecified trimester: Secondary | ICD-10-CM | POA: Insufficient documentation

## 2023-02-19 NOTE — Progress Notes (Signed)
 New OB Intake  I connected with Margaret Ingram  on 02/19/23 at  8:15 AM EST by MyChart Video Visit and verified that I am speaking with the correct person using two identifiers. Nurse is located at [facility] and pt is located at home.  I discussed the limitations, risks, security and privacy concerns of performing an evaluation and management service by telephone and the availability of in person appointments. I also discussed with the patient that there may be a patient responsible charge related to this service. The patient expressed understanding and agreed to proceed.  I explained I am completing New OB Intake today. We discussed EDD of 06/12/2023, by Ultrasound. Pt is [id number]. I reviewed her allergies, medications and Medical/Surgical/OB history.    Patient Active Problem List   Diagnosis Date Noted   Supervision of high risk pregnancy, antepartum 02/19/2023   Chronic hypertension affecting pregnancy 11/26/2022   Pre-diabetes 06/22/2019   Preterm labor in second trimester with preterm delivery in second trimester 06/22/2019   Preterm labor with preterm delivery in second trimester    Prolapse of cord complicating labor and delivery    Hx of chlamydia infection 08/17/2018   Hx of preeclampsia, prior pregnancy, currently pregnant, third trimester 08/17/2018   Chronic hypertension 08/17/2018   Depression 08/17/2018   Vaginal bleeding in pregnancy, first trimester 03/26/2018    Concerns addressed today  Delivery Plans Plans to deliver at [facility]. Discussed the nature of our practice with multiple providers including residents and students. Due to the size of the practice, the delivering provider may not be the same as those providing prenatal care.   Patient is not interested in water  birth. Offered upcoming OB visit with CNM to discuss further.  MyChart/Babyscripts MyChart access verified. I explained pt will have some visits in office and some virtually. Babyscripts instructions  given and order placed. Patient verifies receipt of registration text/e-mail. Account successfully created and app downloaded. If patient is a candidate for Optimized scheduling, add to sticky note.   Blood Pressure Cuff/Weight Scale Pt has her owm BP Cuff,  Explained after first prenatal appt pt will check weekly and document in Babyscripts. Patient does have weight scale.  Anatomy US  Explained first scheduled US  will be around 19 weeks. Anatomy US  scheduled for 03/07/23 at 0715a.  Is patient a CenteringPregnancy candidate?    Not a candidate due to Complex coordination of care needed If accepted,    Is patient a Mom+Baby Combined Care candidate?  Not a candidate   If accepted, confirm patient does not intend to move from the area for at least 12 months, then notify Mom+Baby staff  Interested in [place]? If yes, send referral and doula dot phrase.   Is patient a candidate for Babyscripts Optimization? No, due to HROB   First visit review I reviewed new OB appt with patient. Explained pt will be seen by Dr.Cresenzo at first visit. Discussed [place] genetic screening with patient. needs Panorama and Horizon.. Routine prenatal labs  needed at [facility] OB visit.    Last Pap 05/31/21-Neg..[facility]  Margaret Ingram, CMA 02/19/2023  10:39 AM

## 2023-02-26 ENCOUNTER — Encounter: Payer: Self-pay | Admitting: *Deleted

## 2023-02-26 ENCOUNTER — Encounter: Payer: Self-pay | Admitting: Certified Nurse Midwife

## 2023-02-27 ENCOUNTER — Other Ambulatory Visit: Payer: Self-pay

## 2023-02-27 ENCOUNTER — Ambulatory Visit (INDEPENDENT_AMBULATORY_CARE_PROVIDER_SITE_OTHER): Payer: MEDICAID | Admitting: Family Medicine

## 2023-02-27 VITALS — BP 120/76 | HR 73 | Wt 272.6 lb

## 2023-02-27 DIAGNOSIS — O0992 Supervision of high risk pregnancy, unspecified, second trimester: Secondary | ICD-10-CM | POA: Diagnosis not present

## 2023-02-27 DIAGNOSIS — O0990 Supervision of high risk pregnancy, unspecified, unspecified trimester: Secondary | ICD-10-CM

## 2023-02-27 DIAGNOSIS — Z98891 History of uterine scar from previous surgery: Secondary | ICD-10-CM

## 2023-02-27 DIAGNOSIS — O3430 Maternal care for cervical incompetence, unspecified trimester: Secondary | ICD-10-CM

## 2023-02-27 DIAGNOSIS — Z3A25 25 weeks gestation of pregnancy: Secondary | ICD-10-CM | POA: Diagnosis not present

## 2023-02-27 DIAGNOSIS — O10912 Unspecified pre-existing hypertension complicating pregnancy, second trimester: Secondary | ICD-10-CM

## 2023-02-27 DIAGNOSIS — Z1332 Encounter for screening for maternal depression: Secondary | ICD-10-CM | POA: Diagnosis not present

## 2023-02-27 DIAGNOSIS — O10919 Unspecified pre-existing hypertension complicating pregnancy, unspecified trimester: Secondary | ICD-10-CM

## 2023-02-27 DIAGNOSIS — O3432 Maternal care for cervical incompetence, second trimester: Secondary | ICD-10-CM

## 2023-02-27 DIAGNOSIS — O09212 Supervision of pregnancy with history of pre-term labor, second trimester: Secondary | ICD-10-CM

## 2023-02-28 ENCOUNTER — Inpatient Hospital Stay (HOSPITAL_COMMUNITY): Payer: MEDICAID

## 2023-02-28 ENCOUNTER — Encounter (HOSPITAL_COMMUNITY): Payer: Self-pay | Admitting: Obstetrics and Gynecology

## 2023-02-28 ENCOUNTER — Inpatient Hospital Stay (HOSPITAL_COMMUNITY)
Admission: AD | Admit: 2023-02-28 | Discharge: 2023-03-06 | DRG: 831 | Disposition: A | Discharge status: Home / Self Care | Payer: MEDICAID | Attending: Obstetrics & Gynecology | Admitting: Obstetrics & Gynecology

## 2023-02-28 DIAGNOSIS — Z3A20 20 weeks gestation of pregnancy: Secondary | ICD-10-CM | POA: Diagnosis not present

## 2023-02-28 DIAGNOSIS — O4692 Antepartum hemorrhage, unspecified, second trimester: Secondary | ICD-10-CM

## 2023-02-28 DIAGNOSIS — O99210 Obesity complicating pregnancy, unspecified trimester: Secondary | ICD-10-CM | POA: Diagnosis present

## 2023-02-28 DIAGNOSIS — Z3A25 25 weeks gestation of pregnancy: Secondary | ICD-10-CM | POA: Diagnosis not present

## 2023-02-28 DIAGNOSIS — O09212 Supervision of pregnancy with history of pre-term labor, second trimester: Secondary | ICD-10-CM | POA: Diagnosis not present

## 2023-02-28 DIAGNOSIS — E669 Obesity, unspecified: Secondary | ICD-10-CM

## 2023-02-28 DIAGNOSIS — O3433 Maternal care for cervical incompetence, third trimester: Secondary | ICD-10-CM | POA: Diagnosis present

## 2023-02-28 DIAGNOSIS — O34219 Maternal care for unspecified type scar from previous cesarean delivery: Secondary | ICD-10-CM | POA: Diagnosis present

## 2023-02-28 DIAGNOSIS — O4592 Premature separation of placenta, unspecified, second trimester: Principal | ICD-10-CM | POA: Diagnosis present

## 2023-02-28 DIAGNOSIS — M549 Dorsalgia, unspecified: Secondary | ICD-10-CM | POA: Diagnosis present

## 2023-02-28 DIAGNOSIS — Z3A26 26 weeks gestation of pregnancy: Secondary | ICD-10-CM | POA: Diagnosis not present

## 2023-02-28 DIAGNOSIS — O10912 Unspecified pre-existing hypertension complicating pregnancy, second trimester: Secondary | ICD-10-CM | POA: Diagnosis present

## 2023-02-28 DIAGNOSIS — O98312 Other infections with a predominantly sexual mode of transmission complicating pregnancy, second trimester: Secondary | ICD-10-CM | POA: Diagnosis present

## 2023-02-28 DIAGNOSIS — O0990 Supervision of high risk pregnancy, unspecified, unspecified trimester: Principal | ICD-10-CM

## 2023-02-28 DIAGNOSIS — A6000 Herpesviral infection of urogenital system, unspecified: Secondary | ICD-10-CM | POA: Diagnosis present

## 2023-02-28 DIAGNOSIS — Z833 Family history of diabetes mellitus: Secondary | ICD-10-CM

## 2023-02-28 DIAGNOSIS — O368320 Maternal care for abnormalities of the fetal heart rate or rhythm, second trimester, not applicable or unspecified: Secondary | ICD-10-CM | POA: Diagnosis present

## 2023-02-28 DIAGNOSIS — O99212 Obesity complicating pregnancy, second trimester: Secondary | ICD-10-CM | POA: Diagnosis not present

## 2023-02-28 DIAGNOSIS — Z6841 Body Mass Index (BMI) 40.0 and over, adult: Secondary | ICD-10-CM

## 2023-02-28 DIAGNOSIS — O09292 Supervision of pregnancy with other poor reproductive or obstetric history, second trimester: Secondary | ICD-10-CM

## 2023-02-28 DIAGNOSIS — Z881 Allergy status to other antibiotic agents status: Secondary | ICD-10-CM | POA: Diagnosis not present

## 2023-02-28 DIAGNOSIS — O26893 Other specified pregnancy related conditions, third trimester: Secondary | ICD-10-CM | POA: Diagnosis present

## 2023-02-28 DIAGNOSIS — O3432 Maternal care for cervical incompetence, second trimester: Secondary | ICD-10-CM | POA: Diagnosis present

## 2023-02-28 DIAGNOSIS — Z7982 Long term (current) use of aspirin: Secondary | ICD-10-CM

## 2023-02-28 DIAGNOSIS — B009 Herpesviral infection, unspecified: Secondary | ICD-10-CM | POA: Diagnosis present

## 2023-02-28 DIAGNOSIS — O4693 Antepartum hemorrhage, unspecified, third trimester: Secondary | ICD-10-CM | POA: Diagnosis present

## 2023-02-28 DIAGNOSIS — I10 Essential (primary) hypertension: Secondary | ICD-10-CM | POA: Diagnosis present

## 2023-02-28 DIAGNOSIS — O4690 Antepartum hemorrhage, unspecified, unspecified trimester: Principal | ICD-10-CM | POA: Diagnosis present

## 2023-02-28 DIAGNOSIS — O09293 Supervision of pregnancy with other poor reproductive or obstetric history, third trimester: Secondary | ICD-10-CM

## 2023-02-28 DIAGNOSIS — O09213 Supervision of pregnancy with history of pre-term labor, third trimester: Secondary | ICD-10-CM

## 2023-02-28 LAB — COMPREHENSIVE METABOLIC PANEL
ALT: 11 [IU]/L (ref 0–32)
ALT: 14 U/L (ref 0–44)
AST: 10 [IU]/L (ref 0–40)
AST: 14 U/L — ABNORMAL LOW (ref 15–41)
Albumin: 3.9 g/dL — ABNORMAL LOW (ref 4.0–5.0)
Albumin: 3.1 g/dL — ABNORMAL LOW (ref 3.5–5.0)
Alkaline Phosphatase: 66 [IU]/L (ref 44–121)
Alkaline Phosphatase: 52 U/L (ref 38–126)
Anion gap: 6 (ref 5–15)
BUN/Creatinine Ratio: 19 (ref 9–23)
BUN: 10 mg/dL (ref 6–20)
BUN: 8 mg/dL (ref 6–20)
Bilirubin Total: <0.2 mg/dL (ref 0.0–1.2)
CO2: 21 mmol/L (ref 20–29)
CO2: 24 mmol/L (ref 22–32)
Calcium: 9.1 mg/dL (ref 8.7–10.2)
Calcium: 9 mg/dL (ref 8.9–10.3)
Chloride: 104 mmol/L (ref 96–106)
Chloride: 108 mmol/L (ref 98–111)
Creatinine, Ser: 0.52 mg/dL — ABNORMAL LOW (ref 0.57–1.00)
Creatinine, Ser: 0.62 mg/dL (ref 0.44–1.00)
GFR, Estimated: >60 mL/min (ref >60)
Globulin, Total: 2.9 g/dL (ref 1.5–4.5)
Glucose, Bld: 95 mg/dL (ref 70–99)
Glucose: 79 mg/dL (ref 70–99)
Potassium: 4.1 mmol/L (ref 3.5–5.2)
Potassium: 3.7 mmol/L (ref 3.5–5.1)
Sodium: 139 mmol/L (ref 134–144)
Sodium: 138 mmol/L (ref 135–145)
Total Bilirubin: 0.4 mg/dL (ref 0.0–1.2)
Total Protein: 6.8 g/dL (ref 6.0–8.5)
Total Protein: 6.8 g/dL (ref 6.5–8.1)
eGFR: 131 mL/min/{1.73_m2} (ref >59)

## 2023-02-28 LAB — CBC WITH DIFFERENTIAL/PLATELET
Abs Immature Granulocytes: 0.01 10*3/uL (ref 0.00–0.07)
Basophils Absolute: 0 10*3/uL (ref 0.0–0.1)
Basophils Relative: 0 %
Eosinophils Absolute: 0 10*3/uL (ref 0.0–0.5)
Eosinophils Relative: 0 %
HCT: 33.8 % — ABNORMAL LOW (ref 36.0–46.0)
Hemoglobin: 11.1 g/dL — ABNORMAL LOW (ref 12.0–15.0)
Immature Granulocytes: 0 %
Lymphocytes Relative: 32 %
Lymphs Abs: 2 10*3/uL (ref 0.7–4.0)
MCH: 29.3 pg (ref 26.0–34.0)
MCHC: 32.8 g/dL (ref 30.0–36.0)
MCV: 89.2 fL (ref 80.0–100.0)
Monocytes Absolute: 0.5 10*3/uL (ref 0.1–1.0)
Monocytes Relative: 8 %
Neutro Abs: 3.6 10*3/uL (ref 1.7–7.7)
Neutrophils Relative %: 60 %
Platelets: 155 10*3/uL (ref 150–400)
RBC: 3.79 MIL/uL — ABNORMAL LOW (ref 3.87–5.11)
RDW: 11.8 % (ref 11.5–15.5)
WBC: 6.2 10*3/uL (ref 4.0–10.5)
nRBC: 0 % (ref 0.0–0.2)

## 2023-02-28 LAB — TYPE AND SCREEN
ABO/RH(D): A POS
Antibody Screen: NEGATIVE

## 2023-02-28 LAB — URINALYSIS, ROUTINE W REFLEX MICROSCOPIC
Bilirubin Urine: NEGATIVE
Glucose, UA: NEGATIVE mg/dL
Ketones, ur: NEGATIVE mg/dL
Leukocytes,Ua: NEGATIVE
Nitrite: NEGATIVE
Protein, ur: 30 mg/dL — AB
RBC / HPF: >50 RBC/hpf (ref 0–5)
Specific Gravity, Urine: 1.029 (ref 1.005–1.030)
pH: 6 (ref 5.0–8.0)

## 2023-02-28 LAB — PROTIME-INR
INR: 1.1 (ref 0.8–1.2)
Prothrombin Time: 13.9 s (ref 11.4–15.2)

## 2023-02-28 LAB — KLEIHAUER-BETKE STAIN
Fetal Cells %: 0 %
Quantitation Fetal Hemoglobin: 0 mL

## 2023-02-28 LAB — CBC
Hematocrit: 35.1 % (ref 34.0–46.6)
Hemoglobin: 11.6 g/dL (ref 11.1–15.9)
MCH: 28.9 pg (ref 26.6–33.0)
MCHC: 33 g/dL (ref 31.5–35.7)
MCV: 88 fL (ref 79–97)
Platelets: 167 10*3/uL (ref 150–450)
RBC: 4.01 x10E6/uL (ref 3.77–5.28)
RDW: 11.2 % — ABNORMAL LOW (ref 11.7–15.4)
WBC: 6 10*3/uL (ref 3.4–10.8)

## 2023-02-28 LAB — APTT: aPTT: 27 s (ref 24–36)

## 2023-02-28 LAB — FIBRINOGEN: Fibrinogen: 583 mg/dL — ABNORMAL HIGH (ref 210–475)

## 2023-02-28 MED ORDER — LACTATED RINGERS IV SOLN: 125.0000 mL/h | INTRAVENOUS | Status: AC

## 2023-02-28 MED ORDER — LACTATED RINGERS IV BOLUS
1000.0000 mL | Freq: Once | INTRAVENOUS | Status: AC
  Administered 2023-02-28: 1000 mL via INTRAVENOUS

## 2023-02-28 MED ORDER — MAGNESIUM SULFATE 40 GM/1000ML IV SOLN
INTRAVENOUS | Status: AC
  Administered 2023-02-28: 6 g via INTRAVENOUS
  Filled 2023-02-28: qty 1000

## 2023-02-28 MED ORDER — MAGNESIUM SULFATE 40 GM/1000ML IV SOLN
2.0000 g/h | INTRAVENOUS | Status: DC
  Administered 2023-02-28 (×2): 2 g/h via INTRAVENOUS

## 2023-02-28 MED ORDER — CALCIUM CARBONATE ANTACID 500 MG PO CHEW
2.0000 | CHEWABLE_TABLET | ORAL | Status: DC | PRN
  Administered 2023-03-04: 400 mg via ORAL
  Filled 2023-02-28: qty 2

## 2023-02-28 MED ORDER — LACTATED RINGERS IV SOLN: INTRAVENOUS | Status: AC

## 2023-02-28 MED ORDER — BETAMETHASONE SOD PHOS & ACET 6 (3-3) MG/ML IJ SUSP
12.0000 mg | INTRAMUSCULAR | Status: AC
  Administered 2023-02-28 – 2023-03-01 (×2): 12 mg via INTRAMUSCULAR
  Filled 2023-02-28: qty 5

## 2023-02-28 MED ORDER — ACETAMINOPHEN 325 MG PO TABS
650.0000 mg | ORAL_TABLET | ORAL | Status: DC | PRN
Start: 2023-02-28 — End: 2023-03-06
  Administered 2023-03-01 – 2023-03-02 (×4): 650 mg via ORAL
  Filled 2023-02-28 (×5): qty 2

## 2023-02-28 MED ORDER — VALACYCLOVIR HCL 500 MG PO TABS
500.0000 mg | ORAL_TABLET | Freq: Two times a day (BID) | ORAL | Status: DC
  Administered 2023-03-01 – 2023-03-06 (×11): 500 mg via ORAL
  Filled 2023-02-28 (×11): qty 1

## 2023-02-28 MED ORDER — MAGNESIUM SULFATE BOLUS VIA INFUSION
6.0000 g | Freq: Once | INTRAVENOUS | Status: AC
  Filled 2023-02-28: qty 1000

## 2023-02-28 MED ORDER — PRENATAL MULTIVITAMIN CH
1.0000 | ORAL_TABLET | Freq: Every day | ORAL | Status: DC
  Administered 2023-03-02 – 2023-03-05 (×4): 1 via ORAL
  Filled 2023-02-28 (×4): qty 1

## 2023-02-28 NOTE — MAU Note (Signed)
.  Margaret Ingram is a 28 y.o. at [redacted]w[redacted]d here in MAU reporting: Pt reports waking up from a nap and started feeling cramping. Pt went to the bathroom and when she wiped she noticed clots and bright red blood. Pt states that she thought it was from her previous exam at the doctors office that could be causing this bleeding but the bleeding continued. Pt states she is currently still bleeding and it is running down her legs.   +Fm.   Pain score: 3/10 mild abdominal cramping.  Vitals:   02/28/23 1935  BP: 134/83  Pulse: 82  Resp: 17  Temp: 98.4 F (36.9 C)  SpO2: 98%    FHT:

## 2023-02-28 NOTE — Progress Notes (Signed)
 Subjective:   Margaret Ingram is a 28 y.o. H3E7695 at [redacted]w[redacted]d by LMP being seen today for her first obstetrical visit.  Her obstetrical history is significant for advanced maternal age, pre-eclampsia, and preterm delivery . Patient does intend to breast feed. Pregnancy history fully reviewed.  Patient reports  pelvic pressure, denies bleeding, gushing of fluid, contractions .  HISTORY: OB History  Gravida Para Term Preterm AB Living  6 5 2 3  0 4  SAB IAB Ectopic Multiple Live Births  0 0 0 0 5    # Outcome Date GA Lbr Len/2nd Weight Sex Type Anes PTL Lv  6 Current           5 Preterm 04/05/21 [redacted]w[redacted]d  5 lb 1.3 oz (2.305 kg) F CS-LTranv Spinal  LIV  4 Preterm 06/22/19 [redacted]w[redacted]d  2 lb 5 oz (1.05 kg) M CS-LTranv Gen  DEC     Name: MOET, MIKULSKI     Apgar1: 5  Apgar5: 8  3 Term 08/12/17 [redacted]w[redacted]d   M Vag-Spont   LIV     Complications: Preeclampsia  2 Term 02/03/16 [redacted]w[redacted]d   M Vag-Spont   LIV     Complications: Preeclampsia  1 Preterm  [redacted]w[redacted]d    Vag-Spont   LIV   Last pap smear was  05/31/2021 and was normal Past Medical History:  Diagnosis Date   Anxiety    Depression    with pregnancies   Diabetes mellitus without complication (HCC)    pre diabetic   Fibroid    Heart murmur    Hypertension    chronic   Infection    UTI   Pregnancy induced hypertension    Preterm contractions 06/12/2017   Seizures (HCC)    anxiety   Past Surgical History:  Procedure Laterality Date   CESAREAN SECTION N/A 06/22/2019   Procedure: CESAREAN SECTION;  Surgeon: Eveline Lynwood MATSU, MD;  Location: [facility] LD ORS;  Service: Obstetrics;  Laterality: N/A;  STAT Cord prolapse Liletta    NO PAST SURGERIES     WISDOM TOOTH EXTRACTION     Family History  Problem Relation Age of Onset   Diabetes Other    Healthy Mother    Healthy Father    Social History   Tobacco Use   Smoking status: Never   Smokeless tobacco: Never   Tobacco comments:    occ  Vaping Use   Vaping status: Never Used  Substance Use  Topics   Alcohol use: Not Currently    Comment: occasionally    Drug use: Not Currently    Types: Marijuana    Comment: not since I got pregnant   Allergies  Allergen Reactions   Azithromycin  Nausea And Vomiting and Nausea Only   Other Rash    *Brown Band-Aid*   Current Outpatient Medications on File Prior to Visit  Medication Sig Dispense Refill   acetaminophen  (TYLENOL ) 325 MG tablet Take 650 mg by mouth every 6 (six) hours as needed for mild pain or headache.     Prenatal Vit-Fe Fumarate-FA (MULTIVITAMIN-PRENATAL) 27-0.8 MG TABS tablet Take 1 tablet by mouth daily. 30 tablet 0   Blood Pressure Monitoring (ADULT BLOOD PRESSURE CUFF LG) KIT 1 kit by Does not apply route daily. Call or return to the hospital if your blood pressure is running higher than 160 for the top number or 110 for the bottom number.  Check your blood pressure if you experience a headache that does not resolve or changes in your  vision or other severe symptoms. (Patient not taking: Reported on 02/27/2023) 1 kit 0   NIFEdipine  (ADALAT  CC) 30 MG 24 hr tablet Take 2 tablets (60 mg total) by mouth daily. (Patient not taking: Reported on 02/27/2023) 30 tablet 0   ondansetron  (ZOFRAN  ODT) 8 MG disintegrating tablet 1/2- 1 tablet q 8 hr prn nausea, vomiting (Patient not taking: Reported on 02/27/2023) 30 tablet 0   No current facility-administered medications on file prior to visit.     Exam   Vitals:   02/27/23 1102  BP: 120/76  Pulse: 73  Weight: 272 lb 9 oz (123.6 kg)   Fetal Heart Rate (bpm): 146  Uterus:     Pelvic Exam: Perineum: no hemorrhoids, normal perineum   Vulva: normal external genitalia, no lesions   Vagina:  normal mucosa, normal discharge   Cervix: no lesions and normal, pap smear done.    Adnexa: normal adnexa and no mass, fullness, tenderness   Bony Pelvis: average  System: General: well-developed, well-nourished female in no acute distress   Skin: normal coloration and turgor, no rashes    Neurologic: oriented, normal, negative, normal mood   Extremities: normal strength, tone, and muscle mass, ROM of all joints is normal   HEENT PERRLA, extraocular movement intact and sclera clear, anicteric   Mouth/Teeth mucous membranes moist, pharynx normal without lesions and dental hygiene good   Neck supple and no masses   Cardiovascular: regular rate and rhythm   Respiratory:  no respiratory distress, normal breath sounds   Abdomen: soft, non-tender; bowel sounds normal; no masses,  no organomegaly     Assessment:   Pregnancy: [id number] Patient Active Problem List   Diagnosis Date Noted   Supervision of high risk pregnancy, antepartum 02/19/2023   Chronic hypertension affecting pregnancy 11/26/2022   Pre-diabetes 06/22/2019   Preterm labor in second trimester with preterm delivery in second trimester 06/22/2019   Preterm labor with preterm delivery in second trimester    Prolapse of cord complicating labor and delivery    Hx of chlamydia infection 08/17/2018   Hx of preeclampsia, prior pregnancy, currently pregnant, third trimester 08/17/2018   Chronic hypertension 08/17/2018   Depression 08/17/2018   Vaginal bleeding in pregnancy, first trimester 03/26/2018     Plan:  1. Supervision of high risk pregnancy, antepartum (Primary) FHR and BP appropriate today  2. Chronic hypertension affecting pregnancy Blood pressure well-controlled today - Protein / creatinine ratio, urine - CBC - Comp Met (CMET)  3. [redacted] weeks gestation of pregnancy  4. History of cesarean delivery Desires trial of labor if possible  5. Current pregnancy with history of pre-term labor in second trimester Preterm delivery at 28 weeks and 34 weeks  6. Cervical incompetence affecting management of mother, antepartum Cerclage in place Patient reporting increased pressure but denies any bleeding.  Reports that she can sometimes feel the sutures Speculum exam showing no bleeding and thick  cervix   Initial labs drawn. Continue prenatal vitamins. Genetic Screening discussed,  patient will get genetic test :  Ultrasound discussed; fetal anatomic survey: results reviewed. Problem list reviewed and updated. The nature of [facility] with multiple MDs and other Advanced Practice Providers was explained to patient; also emphasized that residents, students are part of our team. Routine obstetric precautions reviewed. No follow-ups on file.

## 2023-02-28 NOTE — Consult Note (Signed)
 [facility]  --  [place]    PRENATAL CONSULT NOTE   History  I was asked by Dr. Izell' team to consult on this patient for possible preterm delivery.  I had the pleasure of meeting with Margaret Ingram and her step-mother today.       Margaret Ingram is a H3E7695 at 25+1 weeks who presented today with vaginal bleeding in the setting of multiple prior PTBs and prophylactic cerclage placement at 20wks during this pregnancy.  Also with history of cHTN and h/o HSV.  She is being admitted for monitoring of vaginal bleeding and fetal status; not currently in labor. Growth ultrasound results pending, but previously noted to be growing normally.  She is now receiving neuro-protective magnesium  and betamethasone  with plan to start prophylactic Valtrex  tomorrow.   She is expecting a boy to be named Aiden this pregnancy.  Of note, her son named Legend who was born at 86 weeks in 02[birth year] died at ~77 months of age (~1 11 corrected age) after discharge from [facility] NICU.  Per mom, death classified as SIDS.     Counseling We discussed the overall survival rate at this gestational age, and the risk of neurologic impairment.  We discussed the expected hospital length of stay and the significant problems associated with extreme prematurity including respiratory distress syndrome/CLD, IVH/PVL, ROP, NEC, and infection risk, and we discussed that the risk of these complications would be higher at 25 weeks than for her prior 28 week infant.    I acknowledged that the NICU team would be present at delivery, and we briefly discussed the initial evaluation/interventions that would likely be needed for respiratory distress.  Mother voiced understanding of an expected, uncomplicated NICU course based on her prior experience.  We discussed the importance of good nutrition and various methods of providing nutrition (parenteral hyperalimentation, gavage feedings and/or oral feeding). Mom plans to attempt to  provide breast milk but is also agreeable to donor breast milk.  We discussed the average length of stay but I noted that the actual LOS would depend on the severity of problems encountered and response to treatments.    She was encouraged to reach out with any more questions or concerns.   Thank you for involving us  in the care of this patient. A member of our team will be available should the family have additional questions.  Time for consultation was approximately >30 minutes, of which more than half was face-to-face.    _____________________ Electronically Signed By: Schuyler Carpen, MD, MS Neonatologist

## 2023-02-28 NOTE — MAU Provider Note (Signed)
 S [name] is a 28 y.o. [phone] patient who presents to MAU today with complaint of heavy vaginal bleeding. She reports waking up from a nap with pelvic cramping and after using the BR she noticed heavy vaginal bleeding with clots and presented with it running down her legs. RN's at the bedside with myself to obtain FHT. NST in progress, IV access established and labs ordered as indicated below. Patient's history is significant for 2 prior c- sections, PTD, h/o PreE and cerclage in situ. Last seen in the OB office on 02/27/23  O BP 127/78 (BP Location: Right Arm)   Pulse 87   Temp 98.2 F (36.8 C) (Tympanic)   Resp 20   Ht 5' 2 (1.575 m)   Wt 121.1 kg   SpO2 99%   BMI 48.83 kg/m  Physical Exam Exam conducted with a chaperone present.  Constitutional:      General: She is not in acute distress.    Appearance: She is well-developed. She is obese.     Comments: Very anxious  Cardiovascular:     Rate and Rhythm: Normal rate.  Genitourinary:    Vagina: Bleeding present.     Cervix: Cervical bleeding present.     Comments: On initial speculum exam there was pooling of vaginal blood in the vault with small clot.  Removed with large swabs approx 50 cc's and cervix was visualized with cerclage strings visualized appeared visually intact.  Cervical Exam was performed and it was closed on my initial exam. Dr Izell was called to the bedside immediately after my initial examination  Neurological:     Mental Status: She is alert.     NPO Status : Last ate @ 1630  Verbal orders   Orders Placed This Encounter  Procedures   US  MFM OB Detail + 14 Weeks    Assess placenta and growth    Standing Status:   Standing    Number of Occurrences:   1    Reason for Exam (SYMPTOM  OR DIAGNOSIS REQUIRED):   VB and cerclage in place   Kleihauer-Betke stain    Standing Status:   Standing    Number of Occurrences:   1   RPR    Standing Status:   Standing    Number of Occurrences:   1    Comprehensive metabolic panel    Standing Status:   Standing    Number of Occurrences:   1   CBC with Differential/Platelet    Standing Status:   Standing    Number of Occurrences:   1   Urinalysis, Routine w reflex microscopic -Urine, Clean Catch    Standing Status:   Standing    Number of Occurrences:   1    Specimen Source:   Urine, Clean Catch [76]   Protime-INR    Standing Status:   Standing    Number of Occurrences:   1   APTT    Standing Status:   Standing    Number of Occurrences:   1   Fibrinogen     Standing Status:   Standing    Number of Occurrences:   1   Diet NPO time specified    Standing Status:   Standing    Number of Occurrences:   1   Strict intake and output    Standing Status:   Standing    Number of Occurrences:   1   Notify physician (specify)    Standing Status:  Standing    Number of Occurrences:   20    Notify Physician:   for pulse less than 60 or greater than 120    Notify Physician:   for respiratory rate less than 12 or greater than 28    Notify Physician:   for temperature greater than 100.4    Notify Physician:   for urinary output less than 30 ml/hr    Notify Physician:   for systolic BP less than 80 or greater than 140    Notify Physician:   for diastolic BP less than 40 or greater than 90   Vital signs    While awake, respect sleep.    Standing Status:   Standing    Number of Occurrences:   1   Defer vaginal exam for vaginal bleeding or PROM <37 weeks    Standing Status:   Standing    Number of Occurrences:   1   Apply Antepartum Care Plan    Standing Status:   Standing    Number of Occurrences:   1   Initiate Oral Care Protocol    Standing Status:   Standing    Number of Occurrences:   1   Initiate Carrier Fluid Protocol    Standing Status:   Standing    Number of Occurrences:   1   SCDs    Standing Status:   Standing    Number of Occurrences:   1    Laterality:   Bilateral   Continuous tocometry    Standing Status:    Standing    Number of Occurrences:   1   Fetal monitoring    Standing Status:   Standing    Number of Occurrences:   1   Bed rest with bathroom privileges    Standing Status:   Standing    Number of Occurrences:   1   Full code    Standing Status:   Standing    Number of Occurrences:   1    By::   Default: patient does not have capacity for decision making, no surrogate or prior directive available   Type and screen [name]    [name]     Standing Status:   Standing    Number of Occurrences:   1   Admit to Inpatient (patient's expected length of stay will be greater than 2 midnights or inpatient only procedure)    Standing Status:   Standing    Number of Occurrences:   1    Hospital Area:   [name] [[id number]]    Level of Care:   Antepartum [20]    Covid Evaluation:   Asymptomatic - no recent exposure (last 10 days) testing not required    Diagnosis:   Vaginal bleeding during pregnancy [[id number]]    Admitting Physician:   PICKENS, CHARLIE [[id number]]    Attending Physician:   PICKENS, CHARLIE [phone]    Certification::   I certify this patient will need inpatient services for at least 2 midnights    Expected Medical Readiness:   02/28/2023     A Medical screening exam complete Vaginal Bleeding/ R/O abruption at 25.[redacted] weeks GA     PLAN  Admission to OB/ Speciality Care as  d/w Dr Izell [name], NP 02/28/2023 8:36 PM

## 2023-02-28 NOTE — H&P (Signed)
 Obstetrics Admission History & Physical  02/28/2023 - 8:28 PM Primary OBGYN: [facility]  Chief Complaint: VB and pain at 1730  History of Present Illness  28 y.o. H3E7695 at [redacted]w[redacted]d, with the above CC. Pregnancy complicated by: 20wk prophylactic McDonald (Ticron) cerclage placed, multiple prior PTBs, BMI 40s, h/o c-sections, CHTN, h/o HSV  [name] states that starting late this afternoon patient had belly and vaginal pain and VB and came to [facility]U. When presenting, blood on patient's legs and spec exam by [facility] provider had blood in the vault, cervix looked long and normal with stitch in place and felt digitally closed. Hard to trace baby but normal FHR. I was called to assess. Patient confirms and NPO since 1600 today.   Patient had her transfer of care visit with [facility] yesterday and had some pressure and had a spec exam and was wnl and no concerning features per Dr. Ilean, who he states he also spoke with Dr. Herchel.   Review of Systems:  as noted in the History of Present Illness.  Patient Active Problem List   Diagnosis Date Noted   Vaginal bleeding during pregnancy 02/28/2023   History of incompetent cervix, currently pregnant in second trimester 02/28/2023   Cervical cerclage suture present in second trimester 02/28/2023   Supervision of high risk pregnancy, antepartum 02/19/2023   History of preterm labor, current pregnancy, second trimester    History of severe pre-eclampsia 08/17/2018   Chronic hypertension 08/17/2018   Depression 08/17/2018   History of gestational diabetes mellitus (GDM) 03/26/2018     PMHx:  Past Medical History:  Diagnosis Date   Anxiety    Chronic hypertension affecting pregnancy 11/26/2022   Depression    with pregnancies   Diabetes mellitus without complication (HCC)    pre diabetic   Fibroid    Heart murmur    Hx of chlamydia infection 08/17/2018   Hypertension    chronic   Infection    UTI   Pre-diabetes 06/22/2019    Pregnancy induced hypertension    Preterm contractions 06/12/2017   Seizures (HCC)    anxiety   PSHx:  Past Surgical History:  Procedure Laterality Date   CERVICAL CERCLAGE  01/28/2023   CERVICAL CERCLAGE  10/2020   rescue cerclage   CESAREAN SECTION N/A 06/22/2019   Procedure: CESAREAN SECTION;  Surgeon: Eveline Lynwood MATSU, MD;  Location: [facility] LD ORS;  Service: Obstetrics;  Laterality: N/A;  STAT Cord prolapse Liletta    CESAREAN SECTION  03/2021   and cerclage removal   CESAREAN SECTION     WISDOM TOOTH EXTRACTION     Medications:  Medications Prior to Admission  Medication Sig Dispense Refill Last Dose/Taking   acetaminophen  (TYLENOL ) 325 MG tablet Take 650 mg by mouth every 6 (six) hours as needed for mild pain or headache.   02/28/2023 at  8:00 AM   aspirin  81 MG chewable tablet Chew by mouth daily.   02/28/2023   Prenatal Vit-Fe Fumarate-FA (MULTIVITAMIN-PRENATAL) 27-0.8 MG TABS tablet Take 1 tablet by mouth daily. 30 tablet 0 02/27/2023   Blood Pressure Monitoring (ADULT BLOOD PRESSURE CUFF LG) KIT 1 kit by Does not apply route daily. Call or return to the hospital if your blood pressure is running higher than 160 for the top number or 110 for the bottom number.  Check your blood pressure if you experience a headache that does not resolve or changes in your vision or other severe symptoms. (Patient not taking: Reported on 02/27/2023) 1  kit 0    NIFEdipine  (ADALAT  CC) 30 MG 24 hr tablet Take 2 tablets (60 mg total) by mouth daily. (Patient not taking: Reported on 02/27/2023) 30 tablet 0    ondansetron  (ZOFRAN  ODT) 8 MG disintegrating tablet 1/2- 1 tablet q 8 hr prn nausea, vomiting (Patient not taking: Reported on 02/27/2023) 30 tablet 0      Allergies: is allergic to azithromycin  and other. OBHx:  OB History  Gravida Para Term Preterm AB Living  6 5 2 3  4   SAB IAB Ectopic Multiple Live Births     0 5    # Outcome Date GA Lbr Len/2nd Weight Sex Type Anes PTL Lv  6 Current           5  Preterm 04/05/21 [redacted]w[redacted]d  2305 g F CS-LTranv Spinal  LIV     Complications: Severe pre-eclampsia  4 Preterm 06/22/19 [redacted]w[redacted]d  1050 g M CS-LTranv Gen  DEC     Complications: Preterm labor, Preterm premature rupture of membranes in third trimester  3 Preterm 08/2018 [redacted]w[redacted]d    Vag-Spont   LIV     Complications: Preterm labor  2 Term 08/12/17 [redacted]w[redacted]d   M Vag-Spont   LIV     Complications: Preeclampsia  1 Term 02/03/16 [redacted]w[redacted]d   M Vag-Spont   LIV     Complications: Preeclampsia            FHx:  Family History  Problem Relation Age of Onset   Diabetes Other    Healthy Mother    Healthy Father    Soc Hx:  Social History   Socioeconomic History   Marital status: Single    Spouse name: Not on file   Number of children: Not on file   Years of education: Not on file   Highest education level: Not on file  Occupational History   Not on file  Tobacco Use   Smoking status: Never   Smokeless tobacco: Never   Tobacco comments:    occ  Vaping Use   Vaping status: Never Used  Substance and Sexual Activity   Alcohol use: Not Currently    Comment: occasionally    Drug use: Not Currently    Types: Marijuana    Comment: not since I got pregnant   Sexual activity: Not Currently    Birth control/protection: None  Other Topics Concern   Not on file  Social History Narrative   Not on file   Social Drivers of Health   Financial Resource Strain: Not on file  Food Insecurity: Food Insecurity Present (04/07/2021)   [facility] prior to 04/21/2022.   Hunger Vital Sign    Worried About Running Out of Food in the Last Year: Sometimes true    Ran Out of Food in the Last Year: Sometimes true  Transportation Needs: Not on file  Physical Activity: Not on file  Stress: Not on file  Social Connections: Unknown (07/04/2021)   Received from [facility]   Social Network    Social Network: Not on file  Intimate Partner Violence: Unknown (05/26/2021)    Received from [facility]   HITS    Physically Hurt: Not on file    Insult or Talk Down To: Not on file    Threaten Physical Harm: Not on file    Scream or Curse: Not on file    Objective    Current Vital Signs 24h Vital Sign Ranges  T  98.1 F (36.7 C) Temp  Avg: 98.3 F (36.8 C)  Min: 98.1 F (36.7 C)  Max: 98.4 F (36.9 C)  BP 112/85 BP  Min: 112/85  Max: 134/83  HR 80 Pulse  Avg: 79  Min: 75  Max: 82  RR 20 Resp  Avg: 19  Min: 17  Max: 20  SaO2 100 % Room Air SpO2  Avg: 99.3 %  Min: 98 %  Max: 100 %       24 Hour I/O Current Shift I/O  Time Ins Outs No intake/output data recorded. No intake/output data recorded.    General: Well nourished, well developed female in no acute distress.  Skin:  Warm and dry.  Respiratory:  Normal respiratory effort Abdomen: obese, gravid, nttp Neuro/Psych:  Normal mood and affect.   SSE: scant old blood in vault, no active bleeding. Cervix closed, no active bleeding and cervix appears long and stitch seen and in place SVE: closed/long  Labs  pending Recent Labs  Lab 02/27/23 1250 02/28/23 1958  WBC 6.0 6.2  HGB 11.6 11.1*  HCT 35.1 33.8*  PLT 167 155    Recent Labs  Lab 02/27/23 1250  NA 139  K 4.1  CL 104  CO2 21  BUN 10  CREATININE 0.52*  CALCIUM  9.1  PROT 6.8  BILITOT <0.2  ALKPHOS 66  ALT 11  AST 10  GLUCOSE 79    Radiology Pending 01/25/23: anatomy non concerning (incomplete), cervix 3cm, 331g, efw 27%, ac 50%, afi wnl, anterior placenta, no previa.   Assessment & Plan   28 y.o. H3E7695 at [redacted]w[redacted]d with VB. Pt stable *Pregnancy: difficult to trace due to GA and body habitus. FHTs wnl. Will try and do continuous monitoring. Updated growth u/s also ordered *VB: admit to inpatient. Continue NPO. D/w her if bleeding comes back and/or worsens, and/or decline in fetal status, patient would need delivery via c-section. F/u admit labs, in addition to KB, PT/INR, PTT, fibrinogen .  *Cerclage: no current indication  for removal *Preterm: BMZ and Mg ordered. NICU consulted and to come see *History of c-sections: see above *CHTN: unclear if patient procardia  as prescribed. Will continue to hold for now.  *H/o HSV: neg spec exam; start ppx valtrex  tomorrow.  *GBS: unknown *FEN/GI: MIVF, NPO.  *PPx: SCDs. Hold on lovenox .  *Analgesia: no current needs *Dispo: pending bleeding status  Bebe Izell Raddle. MD Attending Center for [facility])

## 2023-03-01 ENCOUNTER — Other Ambulatory Visit: Payer: Self-pay

## 2023-03-01 DIAGNOSIS — B009 Herpesviral infection, unspecified: Secondary | ICD-10-CM

## 2023-03-01 DIAGNOSIS — Z6841 Body Mass Index (BMI) 40.0 and over, adult: Secondary | ICD-10-CM

## 2023-03-01 DIAGNOSIS — O4692 Antepartum hemorrhage, unspecified, second trimester: Secondary | ICD-10-CM

## 2023-03-01 DIAGNOSIS — Z3A20 20 weeks gestation of pregnancy: Secondary | ICD-10-CM

## 2023-03-01 DIAGNOSIS — O99210 Obesity complicating pregnancy, unspecified trimester: Secondary | ICD-10-CM | POA: Diagnosis present

## 2023-03-01 HISTORY — DX: Herpesviral infection, unspecified: B00.9

## 2023-03-01 HISTORY — DX: Body Mass Index (BMI) 40.0 and over, adult: Z684

## 2023-03-01 LAB — CBC
HCT: 33.9 % — ABNORMAL LOW (ref 36.0–46.0)
HCT: 35 % — ABNORMAL LOW (ref 36.0–46.0)
Hemoglobin: 11.3 g/dL — ABNORMAL LOW (ref 12.0–15.0)
Hemoglobin: 11.5 g/dL — ABNORMAL LOW (ref 12.0–15.0)
MCH: 29.3 pg (ref 26.0–34.0)
MCH: 29.1 pg (ref 26.0–34.0)
MCHC: 33.3 g/dL (ref 30.0–36.0)
MCHC: 32.9 g/dL (ref 30.0–36.0)
MCV: 87.8 fL (ref 80.0–100.0)
MCV: 88.6 fL (ref 80.0–100.0)
Platelets: 158 10*3/uL (ref 150–400)
Platelets: 171 10*3/uL (ref 150–400)
RBC: 3.86 MIL/uL — ABNORMAL LOW (ref 3.87–5.11)
RBC: 3.95 MIL/uL (ref 3.87–5.11)
RDW: 11.9 % (ref 11.5–15.5)
RDW: 11.8 % (ref 11.5–15.5)
WBC: 7 10*3/uL (ref 4.0–10.5)
WBC: 6.5 10*3/uL (ref 4.0–10.5)
nRBC: 0 % (ref 0.0–0.2)
nRBC: 0 % (ref 0.0–0.2)

## 2023-03-01 LAB — RPR: RPR Ser Ql: NONREACTIVE

## 2023-03-01 MED ORDER — LACTATED RINGERS IV BOLUS: 500.0000 mL | Freq: Once | INTRAVENOUS | Status: DC

## 2023-03-01 NOTE — Progress Notes (Addendum)
 Antepartum Note  Admission Date: 02/28/2023 Current Date: 03/01/2023 1:44 AM  Margaret Ingram is a 28 y.o. H3E7695 at [redacted]w[redacted]d, HD#2, admitted for VB with cerclage in place.  Pregnancy complicated by: Patient Active Problem List   Diagnosis Date Noted   BMI 45.0-49.9, adult (HCC) 03/01/2023   Obesity in pregnancy 03/01/2023   Vaginal bleeding during pregnancy 02/28/2023   History of incompetent cervix, currently pregnant in second trimester 02/28/2023   Cervical cerclage suture present in second trimester 02/28/2023   Supervision of high risk pregnancy, antepartum 02/19/2023   History of preterm labor, current pregnancy, second trimester    History of severe pre-eclampsia 08/17/2018   Chronic hypertension 08/17/2018   Depression 08/17/2018   History of gestational diabetes mellitus (GDM) 03/26/2018   Overnight/24hr events:  N/a  Subjective:  CTSP for VB once coming over from [facility] triage and going to the bathroom.   Objective:    Current Vital Signs 24h Vital Sign Ranges  T 97.9 F (36.6 C) Temp  Avg: 98.1 F (36.7 C)  Min: 97.7 F (36.5 C)  Max: 98.4 F (36.9 C)  BP 114/78 BP  Min: 110/52  Max: 134/83  HR 82 Pulse  Avg: 79.7  Min: 74  Max: 87  RR 16 Resp  Avg: 18.4  Min: 16  Max: 20  SaO2 97 % Room Air SpO2  Avg: 99.2 %  Min: 96 %  Max: 100 %       24 Hour I/O Current Shift I/O  Time Ins Outs 01/09 0701 - 01/10 0700 In: -  Out: 400 [Urine:400] 01/09 1901 - 01/10 0700 In: -  Out: 400 [Urine:400]   Patient Vitals for the past 24 hrs:  BP Temp Temp src Pulse Resp SpO2 Height Weight  03/01/23 0044 114/78 -- -- 82 -- 97 % -- --  02/28/23 2347 (!) 110/52 97.9 F (36.6 C) Oral 76 16 -- -- --  02/28/23 2331 128/60 -- -- 74 18 -- -- --  02/28/23 2245 122/73 97.7 F (36.5 C) Oral 83 17 100 % -- --  02/28/23 2125 -- -- -- -- -- 100 % -- --  02/28/23 2120 -- -- -- -- -- 100 % -- --  02/28/23 2115 -- -- -- -- -- 100 % -- --  02/28/23 2110 -- -- -- -- -- 100 % -- --   02/28/23 2105 -- -- -- -- -- 100 % -- --  02/28/23 2100 -- -- -- -- -- 100 % -- --  02/28/23 2055 -- -- -- -- -- 100 % -- --  02/28/23 2050 -- -- -- -- -- 100 % -- --  02/28/23 2045 -- -- -- -- -- 99 % -- --  02/28/23 2040 -- -- -- -- -- 99 % -- --  02/28/23 2038 115/69 97.9 F (36.6 C) Oral 78 19 99 % -- --  02/28/23 2035 -- -- -- -- -- 98 % -- --  02/28/23 2031 127/78 98.2 F (36.8 C) Oral 87 20 99 % -- --  02/28/23 2025 -- -- -- -- -- 100 % -- --  02/28/23 2021 112/85 98.1 F (36.7 C) Oral 80 20 100 % -- --  02/28/23 2015 -- -- -- -- -- 100 % -- --  02/28/23 2010 -- -- -- -- -- 96 % -- --  02/28/23 2008 117/87 98.3 F (36.8 C) Oral 75 20 100 % -- --  02/28/23 2005 -- -- -- -- -- 100 % -- --  02/28/23 1950 -- -- -- -- --  100 % -- --  02/28/23 1945 -- -- -- -- -- 99 % -- --  02/28/23 1940 -- -- -- -- -- 98 % -- --  02/28/23 1935 134/83 98.4 F (36.9 C) Oral 82 17 98 % -- --  02/28/23 1931 -- -- -- -- -- -- 5' 2 (1.575 m) 121.1 kg  02/28/23 1930 -- -- -- -- -- 98 % -- --  02/28/23 1925 -- -- -- -- -- 99 % -- --   Fetal Heart Tones: Baby category I and appropriate for GA  Tocometry: no overt UCs noted  Physical exam: General: Well nourished, well developed female in no acute distress. Resting comfortably GU: Pad checked and it has about a 2x4in area of old blood on it, no active bleeding from the vagina. Abdomen: obese, nttp Respiratory: no respiratory distress Extremities: no clubbing, cyanosis or edema Skin: Warm and dry.   Medications: Current Facility-Administered Medications  Medication Dose Route Frequency Provider Last Rate Last Admin   acetaminophen  (TYLENOL ) tablet 650 mg  650 mg Oral Q4H PRN Izell Harari, MD       betamethasone  acetate-betamethasone  sodium phosphate  (CELESTONE ) injection 12 mg  12 mg Intramuscular Q24 Hr x 2 Izell Harari, MD   12 mg at 02/28/23 2008   calcium  carbonate (TUMS - dosed in mg elemental calcium ) chewable tablet 400 mg of  elemental calcium   2 tablet Oral Q4H PRN Izell Harari, MD       lactated ringers  infusion   Intravenous Continuous Izell Harari, MD 10 mL/hr at 02/28/23 2013 New Bag at 02/28/23 2013   lactated ringers  infusion  125 mL/hr Intravenous On Call to OR Izell Harari, MD       lactated ringers  infusion   Intravenous Continuous Izell Harari, MD       magnesium  sulfate 40 grams in SWI 1000 mL OB infusion  2 g/hr Intravenous Titrated Izell Harari, MD 50 mL/hr at 02/28/23 2033 2 g/hr at 02/28/23 2033   prenatal multivitamin tablet 1 tablet  1 tablet Oral Q1200 Izell Harari, MD       valACYclovir  (VALTREX ) tablet 500 mg  500 mg Oral BID Izell Harari, MD        Labs:  KB negative, coags and fibrinogen  Pending: GC/CT, RPR  Recent Labs  Lab 02/27/23 1250 02/28/23 1958  WBC 6.0 6.2  HGB 11.6 11.1*  HCT 35.1 33.8*  PLT 167 155    Recent Labs  Lab 02/27/23 1250 02/28/23 1958  NA 139 138  K 4.1 3.7  CL 104 108  CO2 21 24  BUN 10 8  CREATININE 0.52* 0.62  CALCIUM  9.1 9.0  PROT 6.8 6.8  BILITOT <0.2 0.4  ALKPHOS 66 52  ALT 11 14  AST 10 14*  GLUCOSE 79 95  A POS  Radiology:  1/9 (prelim:) efw 25%, 734g, ac 23%, afi wnl, placenta wnl , CL 4cm  Assessment & Plan:  Patient stable *Pregnancy: I told her that her labs (neg KB) and u/s don't show s/s of an abruption but clinically we have to assume so, but as long as bleeding is not increasing and her and baby look okay, can continue with expectant management  D/w her that at this GA we want to keep the baby in as long as possible but if has heavy VB or decline in maternal/fetal status then she would need delivery, which would be via c/s. I d/w her and consented her for a c/s; she declined a BTL  Continue NPO,  continuous EFM, Mg for fetal neuroprotection. Pt already seen by NICU. Will order 5am CBC; initial hgb 11.1 *Preterm: s/p NICU consult. BMZ#2 1/10 at 2100. *h/o prior c-sections: pt consented..  *CHTN: no  current issues on no meds *PPx: SCDs *FEN/GI: NPO, MIVF  Bebe Izell Raddle MD Attending Center for [facility] (Faculty Practice) GYN Consult Phone: [phone] (M-F, [phone]) & [phone]  (Off hours, weekends, holidays)   Bebe Izell, Raddle MD Attending Center for [employer] (Faculty Practice) 03/01/2023 Time: [phone]

## 2023-03-02 DIAGNOSIS — O4692 Antepartum hemorrhage, unspecified, second trimester: Secondary | ICD-10-CM

## 2023-03-02 DIAGNOSIS — Z3A25 25 weeks gestation of pregnancy: Secondary | ICD-10-CM

## 2023-03-02 MED ORDER — CYCLOBENZAPRINE HCL 10 MG PO TABS
10.0000 mg | ORAL_TABLET | Freq: Three times a day (TID) | ORAL | Status: DC | PRN
  Administered 2023-03-02: 10 mg via ORAL
  Filled 2023-03-02: qty 1

## 2023-03-02 NOTE — Progress Notes (Signed)
 FACULTY PRACTICE ANTEPARTUM COMPREHENSIVE PROGRESS NOTE  Gwenda Heiner is a 28 y.o. H3E7695 at [redacted]w[redacted]d with McDonald cerclage in place & hx prior CSx2 who is admitted for vaginal bleeding, suspected marginal abruption.  Estimated Date of Delivery: 06/12/23 Fetal presentation is cephalic.  Length of Stay:  2 Days. Admitted 02/28/2023  Subjective: Feeling well this morning. Had a small amount of brown discharge this morning, but no active bleeding. Denies pain/contractions. No LOF. +FM  Vitals:  Blood pressure (!) 104/59, pulse 70, temperature 98.7 F (37.1 C), temperature source Oral, resp. rate 16, height 5' 2 (1.575 m), weight 121.1 kg, SpO2 98%, not currently breastfeeding. Physical Examination: CONSTITUTIONAL: Well-developed, well-nourished female in no acute distress.  CARDIOVASCULAR: Normal heart rate noted RESPIRATORY: Effort normal, no problems with respiration noted ABDOMEN: Soft, nontender, nondistended, gravid. CERVIX: Dilation: Closed Exam by:: Izell, MD - visual on admission  Fetal monitoring: FHR: 135 bpm, Variability: moderate, Accelerations: Absent, Decelerations: Variables  Uterine activity: 0 contractions per hour  Results for orders placed or performed during the hospital encounter of 02/28/23 (from the past 48 hours)  Type and screen  [facility]     Status: None   Collection Time: 02/28/23  7:57 PM  Result Value Ref Range   ABO/RH(D) A POS    Antibody Screen NEG    Sample Expiration      03/03/2023,2359 Performed at [facility], [address], [place], [place] [address]   Urinalysis, Routine w reflex microscopic -Urine, Clean Catch     Status: Abnormal   Collection Time: 02/28/23  7:57 PM  Result Value Ref Range   Color, Urine YELLOW YELLOW   APPearance HAZY (A) CLEAR   Specific Gravity, Urine 1.029 1.005 - 1.030   pH 6.0 5.0 - 8.0   Glucose, UA NEGATIVE NEGATIVE mg/dL   Hgb urine dipstick LARGE (A) NEGATIVE   Bilirubin Urine  NEGATIVE NEGATIVE   Ketones, ur NEGATIVE NEGATIVE mg/dL   Protein, ur 30 (A) NEGATIVE mg/dL   Nitrite NEGATIVE NEGATIVE   Leukocytes,Ua NEGATIVE NEGATIVE   RBC / HPF >50 0 - 5 RBC/hpf   WBC, UA 0-5 0 - 5 WBC/hpf   Bacteria, UA RARE (A) NONE SEEN   Squamous Epithelial / HPF 0-5 0 - 5 /HPF   Mucus PRESENT     Comment: Performed at [facility], [address] [place], [place] [address]  Protime-INR     Status: None   Collection Time: 02/28/23  7:57 PM  Result Value Ref Range   Prothrombin Time 13.9 11.4 - 15.2 seconds   INR 1.1 0.8 - 1.2    Comment: (NOTE) INR goal varies based on device and disease states. Performed at [facility], [address] [place], [place] [address]   APTT     Status: None   Collection Time: 02/28/23  7:57 PM  Result Value Ref Range   aPTT 27 24 - 36 seconds    Comment: Performed at [facility], [address], [place], [place] [address]  Fibrinogen      Status: Abnormal   Collection Time: 02/28/23  7:57 PM  Result Value Ref Range   Fibrinogen  583 (H) 210 - 475 mg/dL    Comment: (NOTE) Fibrinogen  results may be underestimated in patients receiving thrombolytic therapy. Performed at [facility], [address], [place], [place] [address]   Kleihauer-Betke stain     Status: None   Collection Time: 02/28/23  7:58 PM  Result Value Ref Range   Fetal Cells % 0 %   Quantitation Fetal Hemoglobin 0.0000 mL    Comment:  up to 15 mLs   # Vials RhIg NOT INDICATED     Comment: Performed at [facility], [address] [place], [place] [address]  RPR     Status: None   Collection Time: 02/28/23  7:58 PM  Result Value Ref Range   RPR Ser Ql NON REACTIVE NON REACTIVE    Comment: Performed at [facility], [address] [place], [place] [address]  Comprehensive metabolic panel     Status: Abnormal   Collection Time: 02/28/23  7:58 PM  Result Value Ref Range   Sodium 138 135 - 145 mmol/L   Potassium 3.7 3.5 - 5.1  mmol/L   Chloride 108 98 - 111 mmol/L   CO2 24 22 - 32 mmol/L   Glucose, Bld 95 70 - 99 mg/dL    Comment: Glucose reference range applies only to samples taken after fasting for at least 8 hours.   BUN 8 6 - 20 mg/dL   Creatinine, Ser 9.37 0.44 - 1.00 mg/dL   Calcium  9.0 8.9 - 10.3 mg/dL   Total Protein 6.8 6.5 - 8.1 g/dL   Albumin 3.1 (L) 3.5 - 5.0 g/dL   AST 14 (L) 15 - 41 U/L   ALT 14 0 - 44 U/L   Alkaline Phosphatase 52 38 - 126 U/L   Total Bilirubin 0.4 0.0 - 1.2 mg/dL   GFR, Estimated >39 >39 mL/min    Comment: (NOTE) Calculated using the CKD-EPI Creatinine Equation (2021)    Anion gap 6 5 - 15    Comment: Performed at [facility], [address], [place], [place] [address]  CBC with Differential/Platelet     Status: Abnormal   Collection Time: 02/28/23  7:58 PM  Result Value Ref Range   WBC 6.2 4.0 - 10.5 K/uL   RBC 3.79 (L) 3.87 - 5.11 MIL/uL   Hemoglobin 11.1 (L) 12.0 - 15.0 g/dL   HCT 66.1 (L) 63.9 - 53.9 %   MCV 89.2 80.0 - 100.0 fL   MCH 29.3 26.0 - 34.0 pg   MCHC 32.8 30.0 - 36.0 g/dL   RDW 88.1 88.4 - 84.4 %   Platelets 155 150 - 400 K/uL   nRBC 0.0 0.0 - 0.2 %   Neutrophils Relative % 60 %   Neutro Abs 3.6 1.7 - 7.7 K/uL   Lymphocytes Relative 32 %   Lymphs Abs 2.0 0.7 - 4.0 K/uL   Monocytes Relative 8 %   Monocytes Absolute 0.5 0.1 - 1.0 K/uL   Eosinophils Relative 0 %   Eosinophils Absolute 0.0 0.0 - 0.5 K/uL   Basophils Relative 0 %   Basophils Absolute 0.0 0.0 - 0.1 K/uL   Immature Granulocytes 0 %   Abs Immature Granulocytes 0.01 0.00 - 0.07 K/uL    Comment: Performed at [facility], [address] [place], [place] [address]  CBC     Status: Abnormal   Collection Time: 03/01/23  4:22 AM  Result Value Ref Range   WBC 7.0 4.0 - 10.5 K/uL   RBC 3.86 (L) 3.87 - 5.11 MIL/uL   Hemoglobin 11.3 (L) 12.0 - 15.0 g/dL   HCT 66.0 (L) 63.9 - 53.9 %   MCV 87.8 80.0 - 100.0 fL   MCH 29.3 26.0 - 34.0 pg   MCHC 33.3 30.0 - 36.0 g/dL  RDW 11.9  11.5 - 15.5 %   Platelets 158 150 - 400 K/uL   nRBC 0.0 0.0 - 0.2 %    Comment: Performed at [facility], [address] [place], [place] [address]  CBC     Status: Abnormal   Collection Time: 03/01/23 11:33 AM  Result Value Ref Range   WBC 6.5 4.0 - 10.5 K/uL   RBC 3.95 3.87 - 5.11 MIL/uL   Hemoglobin 11.5 (L) 12.0 - 15.0 g/dL   HCT 64.9 (L) 63.9 - 53.9 %   MCV 88.6 80.0 - 100.0 fL   MCH 29.1 26.0 - 34.0 pg   MCHC 32.9 30.0 - 36.0 g/dL   RDW 88.1 88.4 - 84.4 %   Platelets 171 150 - 400 K/uL   nRBC 0.0 0.0 - 0.2 %    Comment: Performed at [facility], [address] [place], [place] [address]    US  MFM OB COMP + 14 WK Result Date: 03/01/2023 ----------------------------------------------------------------------  OBSTETRICS REPORT                       (Signed Final 03/01/2023 11:42 am) ---------------------------------------------------------------------- Patient Info  ID #:       [id number]                          D.O.B.:  [birth year] (27 yrs)(F)  Name:       HERMAN CANADA               Visit Date: 02/28/2023 10:36 pm ---------------------------------------------------------------------- Performed By  Attending:        Fredia Fresh MD        Referred By:      [facility] MAU/Triage  Performed By:     Mardy Heller          [facility] ---------------------------------------------------------------------- Orders  #  Description                           Code        Ordered By  1  US  MFM OB COMP + 14 WK                76805.01    CHARLIE PICKENS  2  US  MFM OB TRANSVAGINAL                23182.7     CHARLIE PICKENS ----------------------------------------------------------------------  #  Order #                     Accession #                Episode #  1  [id number]                   [id number]                 [id number]  2  [id number]  [id number]                 [id number]  ---------------------------------------------------------------------- Indications  Encounter for antenatal screening for          Z36.3  malformations  Vaginal bleeding in pregnancy, second          O46.92  trimester  Poor obstetric history: Previous preterm       O09.219  delivery, antepartum ([redacted]w[redacted]d, [redacted]w[redacted]d, 32w)  Cervical cerclage suture present, second       O34.32  trimester  Obesity complicating pregnancy, second         O99.212  trimester  [redacted] weeks gestation of pregnancy                Z3A.25 ---------------------------------------------------------------------- Vital Signs  Weight (lb): 267                               Height:        5'2  BMI:         48.83 ---------------------------------------------------------------------- Fetal Evaluation  Num Of Fetuses:         1  Fetal Heart Rate(bpm):  140  Cardiac Activity:       Observed  Presentation:           Cephalic  Placenta:               Anterior  P. Cord Insertion:      Visualized  Amniotic Fluid  AFI FV:      Within normal limits                              Largest Pocket(cm)                              5.7  Comment:    No placental abruption or previa identified. ---------------------------------------------------------------------- Biometry  BPD:      60.6  mm     G. Age:  24w 5d         25  %    CI:        73.15   %    70 - 86                                                          FL/HC:      20.3   %    18.7 - 20.3  HC:      225.2  mm     G. Age:  24w 4d         12  %    HC/AC:      1.13        1.04 - 1.22  AC:      198.8  mm     G. Age:  24w 4d         23  %    FL/BPD:     75.6   %    71 - 87  FL:       45.8  mm     G. Age:  25w 1d         37  %  FL/AC:      23.0   %    20 - 24  CER:      28.9  mm     G. Age:  25w 3d         60  %  Est. FW:     734  gm    1 lb 10 oz      25  % ---------------------------------------------------------------------- OB History  Gravidity:    6         Term:   2        Prem:   3  ---------------------------------------------------------------------- Gestational Age  U/S Today:     24w 5d                                        EDD:   06/15/23  Best:          25w 1d     Det. By:  Early Ultrasound         EDD:   06/12/23                                      (11/14/22) ---------------------------------------------------------------------- Anatomy  Cranium:               Appears normal         LVOT:                   Appears normal  Cavum:                 Appears normal         Aortic Arch:            Appears normal  Ventricles:            Appears normal         Ductal Arch:            Not well visualized  Choroid Plexus:        Appears normal         Diaphragm:              Appears normal  Cerebellum:            Appears normal         Stomach:                Appears normal, left                                                                        sided  Posterior Fossa:       Appears normal         Abdomen:                Appears normal  Nuchal Fold:           Not applicable (>20    Abdominal Wall:         Appears nml (cord  wks GA)                                        insert, abd wall)  Face:                  Orbits appear          Cord Vessels:           Appears normal (3                         normal                                         vessel cord)  Lips:                  Not well visualized    Kidneys:                Appear normal  Palate:                Not well visualized    Bladder:                Appears normal  Thoracic:              Appears normal         Spine:                  Not well visualized  Heart:                 Appears normal         Upper Extremities:      Not well visualized                         (4CH, axis, and                         situs)  RVOT:                  Appears normal         Lower Extremities:      Not well visualized  Other:  Fetus appears to be a female. ----------------------------------------------------------------------  Cervix Uterus Adnexa  Cervix  Length:              4  cm.  Normal appearance by transvaginal scan Cerclage visualized.  Uterus  No abnormality visualized.  Right Ovary  Within normal limits.  Left Ovary  Not visualized.  Adnexa  No abnormality visualized ---------------------------------------------------------------------- Impression  Patient presented to the MAU with complaints of vaginal  bleeding and abdominal pain.  She had prophylactic cerclage  in this pregnancy at [facility].  Fetal biometry is consistent with her previously established  dates.  Amniotic fluid is normal good fetal activity seen.  Fetal  anatomical survey is limited but appears normal.  Placenta  appears normal with no evidence of retroplacental  hemorrhage.  We performed a transvaginal ultrasound to evaluate the  cervix.  The cervix measures 4 cm and the cerclage is in  place.  This study was remotely read . ----------------------------------------------------------------------  Fredia Fresh, MD Electronically Signed Final Report   03/01/2023 11:42 am ----------------------------------------------------------------------   US  MFM OB Transvaginal Result Date: 03/01/2023 ----------------------------------------------------------------------  OBSTETRICS REPORT                       (Signed Final 03/01/2023 11:42 am) ---------------------------------------------------------------------- Patient Info  ID #:       [id number]                          D.O.B.:  09-27-1995 (27 yrs)(F)  Name:       HERMAN CANADA               Visit Date: 02/28/2023 10:36 pm ---------------------------------------------------------------------- Performed By  Attending:        Fredia Fresh MD        Referred By:      [facility] MAU/Triage  Performed By:     Mardy Heller          [facility]  ---------------------------------------------------------------------- Orders  #  Description                           Code        Ordered By  1  US  MFM OB COMP + 14 WK                76805.01    CHARLIE PICKENS  2  US  MFM OB TRANSVAGINAL                23182.7     CHARLIE PICKENS ----------------------------------------------------------------------  #  Order #                     Accession #                Episode #  1  [id number]                   [id number]                 [id number]  2  [id number]                   [id number]                 [id number] ---------------------------------------------------------------------- Indications  Encounter for antenatal screening for          Z36.3  malformations  Vaginal bleeding in pregnancy, second          O46.92  trimester  Poor obstetric history: Previous preterm       O09.219  delivery, antepartum ([redacted]w[redacted]d, [redacted]w[redacted]d, 32w)  Cervical cerclage suture present, second       O34.32  trimester  Obesity complicating pregnancy, second         O99.212  trimester  [redacted] weeks gestation of pregnancy                Z3A.25 ---------------------------------------------------------------------- Vital Signs  Weight (lb): 267  Height:        5'2  BMI:         48.83 ---------------------------------------------------------------------- Fetal Evaluation  Num Of Fetuses:         1  Fetal Heart Rate(bpm):  140  Cardiac Activity:       Observed  Presentation:           Cephalic  Placenta:               Anterior  P. Cord Insertion:      Visualized  Amniotic Fluid  AFI FV:      Within normal limits                              Largest Pocket(cm)                              5.7  Comment:    No placental abruption or previa identified. ---------------------------------------------------------------------- Biometry  BPD:      60.6  mm     G. Age:  24w 5d         25  %    CI:        73.15   %    70 - 86                                                          FL/HC:       20.3   %    18.7 - 20.3  HC:      225.2  mm     G. Age:  24w 4d         12  %    HC/AC:      1.13        1.04 - 1.22  AC:      198.8  mm     G. Age:  24w 4d         23  %    FL/BPD:     75.6   %    71 - 87  FL:       45.8  mm     G. Age:  25w 1d         37  %    FL/AC:      23.0   %    20 - 24  CER:      28.9  mm     G. Age:  25w 3d         60  %  Est. FW:     734  gm    1 lb 10 oz      25  % ---------------------------------------------------------------------- OB History  Gravidity:    6         Term:   2        Prem:   3 ---------------------------------------------------------------------- Gestational Age  U/S Today:     24w 5d                                        EDD:   06/15/23  Best:  25w 1d     Det. By:  Early Ultrasound         EDD:   06/12/23                                      (11/14/22) ---------------------------------------------------------------------- Anatomy  Cranium:               Appears normal         LVOT:                   Appears normal  Cavum:                 Appears normal         Aortic Arch:            Appears normal  Ventricles:            Appears normal         Ductal Arch:            Not well visualized  Choroid Plexus:        Appears normal         Diaphragm:              Appears normal  Cerebellum:            Appears normal         Stomach:                Appears normal, left                                                                        sided  Posterior Fossa:       Appears normal         Abdomen:                Appears normal  Nuchal Fold:           Not applicable (>20    Abdominal Wall:         Appears nml (cord                         wks GA)                                        insert, abd wall)  Face:                  Orbits appear          Cord Vessels:           Appears normal (3                         normal                                         vessel cord)  Lips:  Not well visualized    Kidneys:                Appear normal  Palate:                 Not well visualized    Bladder:                Appears normal  Thoracic:              Appears normal         Spine:                  Not well visualized  Heart:                 Appears normal         Upper Extremities:      Not well visualized                         (4CH, axis, and                         situs)  RVOT:                  Appears normal         Lower Extremities:      Not well visualized  Other:  Fetus appears to be a female. ---------------------------------------------------------------------- Cervix Uterus Adnexa  Cervix  Length:              4  cm.  Normal appearance by transvaginal scan Cerclage visualized.  Uterus  No abnormality visualized.  Right Ovary  Within normal limits.  Left Ovary  Not visualized.  Adnexa  No abnormality visualized ---------------------------------------------------------------------- Impression  Patient presented to the MAU with complaints of vaginal  bleeding and abdominal pain.  She had prophylactic cerclage  in this pregnancy at [facility].  Fetal biometry is consistent with her previously established  dates.  Amniotic fluid is normal good fetal activity seen.  Fetal  anatomical survey is limited but appears normal.  Placenta  appears normal with no evidence of retroplacental  hemorrhage.  We performed a transvaginal ultrasound to evaluate the  cervix.  The cervix measures 4 cm and the cerclage is in  place.  This study was remotely read . ----------------------------------------------------------------------                 Fredia Fresh, MD Electronically Signed Final Report   03/01/2023 11:42 am ----------------------------------------------------------------------    Current scheduled medications  prenatal multivitamin  1 tablet Oral Q1200   valACYclovir   500 mg Oral BID    I have reviewed the patient's current medications.  ASSESSMENT: Principal Problem:   Vaginal bleeding during pregnancy Active Problems:   Chronic  hypertension   History of incompetent cervix, currently pregnant in second trimester   Cervical cerclage suture present in second trimester   BMI 45.0-49.9, adult (HCC)   Obesity in pregnancy   PLAN: Vaginal bleeding, suspected marginal abruption; cerclage in place No active bleeding this AM No indication to remove cerclage at this time BID NST  S/p BMZ & Mag 1/9-10 S/p NICU consult @ 25/1: 734g (25%), AC 23%, ceph, ant, AFI 5.7. CL4cm  2. Prior CS For repeat, consent previously signed  3. cHTN Normotensive no meds  4. Hx HSV No known prior outbreaks, but +serologies for HSV1&2 Reviewed w/ patient today Continue valtrex   Continue expectant management  Kieth Carolin, MD Obstetrician & Gynecologist, [facility], [facility]

## 2023-03-03 DIAGNOSIS — Z3A25 25 weeks gestation of pregnancy: Secondary | ICD-10-CM

## 2023-03-03 LAB — WET PREP, GENITAL
Clue Cells Wet Prep HPF POC: NONE SEEN
Sperm: NONE SEEN
Trich, Wet Prep: NONE SEEN
WBC, Wet Prep HPF POC: >=10 — AB (ref <10)
Yeast Wet Prep HPF POC: NONE SEEN

## 2023-03-03 MED ORDER — LIDOCAINE 5 % EX PTCH
1.0000 | MEDICATED_PATCH | CUTANEOUS | Status: DC
  Administered 2023-03-03 – 2023-03-06 (×2): 1 via TRANSDERMAL
  Filled 2023-03-03 (×4): qty 1

## 2023-03-03 NOTE — Progress Notes (Signed)
 FACULTY PRACTICE ANTEPARTUM COMPREHENSIVE PROGRESS NOTE  Margaret Ingram is a 28 y.o. H3E7695 at [redacted]w[redacted]d with McDonald cerclage in place & hx prior CSx2 who is admitted for vaginal bleeding, suspected marginal abruption.  Estimated Date of Delivery: 06/12/23 Fetal presentation is cephalic.  Length of Stay:  3 Days. Admitted 02/28/2023  Subjective: Feeling well this morning but notes some paraspinal pain. Using heating pad and took flexeril . Would be interested in lidocaine  patch. Denies vaginal bleeding. Notes vaginal discharge that is new today along with stabbing vaginal pain. Denies contractions. No LOF. +FM  Vitals:  Blood pressure 107/62, pulse 67, temperature 98.2 F (36.8 C), temperature source Oral, resp. rate 18, height 5' 2 (1.575 m), weight 121.1 kg, SpO2 99%, not currently breastfeeding. Physical Examination: CONSTITUTIONAL: Well-developed, well-nourished female in no acute distress.  CARDIOVASCULAR: Normal heart rate noted RESPIRATORY: Effort normal, no problems with respiration noted ABDOMEN: Soft, nontender, nondistended, gravid. CERVIX: Dilation: Closed Exam by:: Pickens, MD - visual on admission  Fetal monitoring: FHR: 140 bpm, Variability: moderate, Accelerations: Absent, Decelerations: Absent. Appropriate for gestational age.   Uterine activity: 0 contractions per hour  Results for orders placed or performed during the hospital encounter of 02/28/23 (from the past 48 hours)  CBC     Status: Abnormal   Collection Time: 03/01/23 11:33 AM  Result Value Ref Range   WBC 6.5 4.0 - 10.5 K/uL   RBC 3.95 3.87 - 5.11 MIL/uL   Hemoglobin 11.5 (L) 12.0 - 15.0 g/dL   HCT 64.9 (L) 63.9 - 53.9 %   MCV 88.6 80.0 - 100.0 fL   MCH 29.1 26.0 - 34.0 pg   MCHC 32.9 30.0 - 36.0 g/dL   RDW 88.1 88.4 - 84.4 %   Platelets 171 150 - 400 K/uL   nRBC 0.0 0.0 - 0.2 %    Comment: Performed at [facility], [address] [place], [place] [address]    No results found.   Current  scheduled medications  prenatal multivitamin  1 tablet Oral Q1200   valACYclovir   500 mg Oral BID    I have reviewed the patient's current medications.  ASSESSMENT: Principal Problem:   Vaginal bleeding during pregnancy Active Problems:   Chronic hypertension   History of incompetent cervix, currently pregnant in second trimester   Cervical cerclage suture present in second trimester   BMI 45.0-49.9, adult (HCC)   Obesity in pregnancy   PLAN: Vaginal bleeding, suspected marginal abruption; cerclage in place No active bleeding this AM  No indication to remove cerclage at this time BID NST  S/p BMZ & Mag 1/9-10 S/p NICU consult @ 25/1: 734g (25%), AC 23%, ceph, ant, AFI 5.7. CL4cm  2. Prior CS For repeat, consent previously signed  3. cHTN Normotensive no meds  4. Hx HSV No known prior outbreaks, but +serologies for HSV1&2 Reviewed w/ patient during admission Continue valtrex   5. Vaginal discharge Check vaginitis swab  6. Back pain Continue heating pad/flexeril . Will add in lidocaine  patch.   Continue expectant management  Vina Solian, MD Obstetrician & Gynecologist, [facility], [facility]

## 2023-03-03 NOTE — Plan of Care (Signed)
  Problem: Education: Goal: Knowledge of disease or condition will improve Outcome: Progressing   Problem: Education: Goal: Knowledge of the prescribed therapeutic regimen will improve Outcome: Progressing   Problem: Education: Goal: Individualized Educational Video(s) Outcome: Progressing   Problem: Clinical Measurements: Goal: Complications related to the disease process, condition or treatment will be avoided or minimized Outcome: Progressing   Problem: Education: Goal: Knowledge of General Education information will improve Description Including pain rating scale, medication(s)/side effects and non-pharmacologic comfort measures Outcome: Progressing

## 2023-03-04 ENCOUNTER — Encounter: Payer: Self-pay | Admitting: *Deleted

## 2023-03-04 LAB — TYPE AND SCREEN
ABO/RH(D): A POS
Antibody Screen: NEGATIVE

## 2023-03-04 LAB — CERVICOVAGINAL ANCILLARY ONLY
Chlamydia: NEGATIVE
Comment: NEGATIVE
Comment: NEGATIVE
Comment: NORMAL
Neisseria Gonorrhea: NEGATIVE
Trichomonas: NEGATIVE

## 2023-03-04 NOTE — Progress Notes (Signed)
 FACULTY PRACTICE ANTEPARTUM COMPREHENSIVE PROGRESS NOTE  Margaret Ingram is a 28 y.o. [id number] at [redacted]w[redacted]d with McDonald cerclage in place & hx prior CSx2 who is admitted for vaginal bleeding, suspected marginal abruption.  Estimated Date of Delivery: 06/12/23 Fetal presentation is cephalic.  Length of Stay:  4 Days. Admitted 02/28/2023  Subjective: Feeling well this morning but notes ongoing paraspinal pain. Using heating pad and took flexeril  and lido patch. These do help but not complete relief.  Denies vaginal bleeding. Denies contractions. No LOF. +FM  Vitals:  Blood pressure 110/75, pulse 71, temperature 98.3 F (36.8 C), temperature source Oral, resp. rate 18, height 5' 2 (1.575 m), weight 121.1 kg, SpO2 98%, not currently breastfeeding. Physical Examination: CONSTITUTIONAL: Well-developed, well-nourished female in no acute distress.  CARDIOVASCULAR: Normal heart rate noted RESPIRATORY: Effort normal, no problems with respiration noted ABDOMEN: Soft, nontender, nondistended, gravid. CERVIX: Dilation: Closed Exam by:: Pickens, MD - visual on admission  Fetal monitoring: FHR: 140 bpm, Variability: moderate, Accelerations: Absent, Decelerations: Absent. Appropriate for gestational age.   Uterine activity: 0 contractions per hour  Results for orders placed or performed during the hospital encounter of 02/28/23 (from the past 48 hours)  Wet prep, genital     Status: Abnormal   Collection Time: 03/03/23 10:45 AM   Specimen: Vaginal  Result Value Ref Range   Yeast Wet Prep HPF POC NONE SEEN NONE SEEN   Trich, Wet Prep NONE SEEN NONE SEEN   Clue Cells Wet Prep HPF POC NONE SEEN NONE SEEN   WBC, Wet Prep HPF POC >=10 (A) <10   Sperm NONE SEEN     Comment: Performed at [facility], [address], [place], [place] [address]  Type and screen [facility]     Status: None   Collection Time: 03/04/23  1:02 AM  Result Value Ref Range   ABO/RH(D) A POS    Antibody  Screen NEG    Sample Expiration      03/07/2023,2359 Performed at [facility], [address], [place], [place] [address]     No results found.   Current scheduled medications  lidocaine   1 patch Transdermal Q24H   prenatal multivitamin  1 tablet Oral Q1200   valACYclovir   500 mg Oral BID    I have reviewed the patient's current medications.  ASSESSMENT: Principal Problem:   Vaginal bleeding during pregnancy Active Problems:   Chronic hypertension   History of incompetent cervix, currently pregnant in second trimester   Cervical cerclage suture present in second trimester   BMI 45.0-49.9, adult (HCC)   Obesity in pregnancy   PLAN: Vaginal bleeding, suspected marginal abruption; cerclage in place No active bleeding this AM  No indication to remove cerclage at this time BID NST  S/p BMZ & Mag 1/9-10 S/p NICU consult @ 25/1: 734g (25%), AC 23%, ceph, ant, AFI 5.7. CL4cm  2. Prior CS For repeat, consent previously signed. She would like c-section if indicated, but if an option based on fetal well being, she confirms she would like to [facility].   3. cHTN Normotensive no meds  4. Hx HSV No known prior outbreaks, but +serologies for HSV1&2 Reviewed w/ patient during admission Continue valtrex   5. Vaginal discharge Vag panel negative. GC/CT pending.   6. Back pain Continue heating pad/flexeril /lido patch  Continue expectant management  Vina Solian, MD Obstetrician & Gynecologist, [facility] for [facility], [facility]

## 2023-03-04 NOTE — Plan of Care (Signed)
  Problem: Education: Goal: Knowledge of disease or condition will improve Outcome: Progressing   Problem: Clinical Measurements: Goal: Complications related to the disease process, condition or treatment will be avoided or minimized Outcome: Progressing   Problem: Clinical Measurements: Goal: Ability to maintain clinical measurements within normal limits will improve Outcome: Progressing Goal: Diagnostic test results will improve Outcome: Not Applicable Goal: Respiratory complications will improve Outcome: Not Applicable   Problem: Activity: Goal: Risk for activity intolerance will decrease Outcome: Progressing   Problem: Nutrition: Goal: Adequate nutrition will be maintained Outcome: Completed/Met   Problem: Coping: Goal: Level of anxiety will decrease Outcome: Progressing   Problem: Elimination: Goal: Will not experience complications related to bowel motility Outcome: Completed/Met

## 2023-03-05 NOTE — Progress Notes (Signed)
 Attempted to place patient on fetal monitoring. Due to extreme fetal movement, unable to place monitor at this time. Explained to patient that we will attempt again in 1 hour. Pt verbalized an understanding. Carmelina Dane, RN

## 2023-03-05 NOTE — Progress Notes (Signed)
 FACULTY PRACTICE ANTEPARTUM PROGRESS NOTE  Margaret Ingram is a 28 y.o. [id number] at [redacted]w[redacted]d who is admitted for vaginal bleeding, suspected marginal abruption. Pt has hx of c/s x 2 and Macdonald cerclage currently in place. Estimated Date of Delivery: 06/12/23 Fetal presentation is cephalic.  Length of Stay:  5 Days. Admitted 02/28/2023  Subjective: Pt doing well.  Chart reviewed in detail.  Last actually bleeding was 02/28/23.  Pt did have some old blood noted on 03/02/23, but no fresh bleeding.  No documentation of new bleed in the chart. Patient reports normal fetal movement.  She denies uterine contractions, denies bleeding and leaking of fluid per vagina.  Mild back pain noted, this is not a new symptom.  Vitals:  Blood pressure 98/61, pulse 63, temperature 98 F (36.7 C), temperature source Oral, resp. rate 18, height 5' 2 (1.575 m), weight 121.1 kg, SpO2 95%, not currently breastfeeding. Physical Examination: CONSTITUTIONAL: Well-developed, well-nourished female in no acute distress.  HENT:  Normocephalic, atraumatic, External right and left ear normal. Oropharynx is clear and moist EYES: Conjunctivae and EOM are normal.  NECK: Normal range of motion, supple, no masses. SKIN: Skin is warm and dry. No rash noted. Not diaphoretic. No erythema. No pallor. NEUROLGIC: Alert and oriented to person, place, and time. Normal reflexes, muscle tone coordination. No cranial nerve deficit noted. PSYCHIATRIC: Normal mood and affect. Normal behavior. Normal judgment and thought content. CARDIOVASCULAR: Normal heart rate noted, regular rhythm RESPIRATORY: Effort and breath sounds normal, no problems with respiration noted MUSCULOSKELETAL: Normal range of motion. No edema and no tenderness. ABDOMEN: Soft, nontender, nondistended, gravid. CERVIX: deferred  Fetal monitoring: FHR: 140s bpm, Variability: moderate, Accelerations: Present, Decelerations: occasional rare variable.  Category 1 strip for  gestational age Uterine activity: none  Results for orders placed or performed during the hospital encounter of 02/28/23 (from the past 48 hours)  Wet prep, genital     Status: Abnormal   Collection Time: 03/03/23 10:45 AM   Specimen: Vaginal  Result Value Ref Range   Yeast Wet Prep HPF POC NONE SEEN NONE SEEN   Trich, Wet Prep NONE SEEN NONE SEEN   Clue Cells Wet Prep HPF POC NONE SEEN NONE SEEN   WBC, Wet Prep HPF POC >=10 (A) <10   Sperm NONE SEEN     Comment: Performed at [facility], [address], [place], [place] [address]  Type and screen [facility]     Status: None   Collection Time: 03/04/23  1:02 AM  Result Value Ref Range   ABO/RH(D) A POS    Antibody Screen NEG    Sample Expiration      03/07/2023,2359 Performed at [facility], [address], [place], [place] [address]     I have reviewed the patient's current medications.  ASSESSMENT: Principal Problem:   Vaginal bleeding during pregnancy Active Problems:   Chronic hypertension   History of incompetent cervix, currently pregnant in second trimester   Cervical cerclage suture present in second trimester   BMI 45.0-49.9, adult (HCC)   Obesity in pregnancy   PLAN: Vaginal bleeding, probable marginal abruption, cerclage in place Cerclage intact and present, reassuring fetal testing S/p BMZ and magnesium  1/9-1/10 S/p NICU consult, Anticipate d/c  home possibly on 1/15 if no rebleed, but another 1-2 days of observation would be reasonable.  Prior c/s x 2.  Repeat c section at current gestational age, consent already signed.  No BTL.  If possible would like  to TOLAC later in pregnancy if conditions stabilize. cHTN: BP normal on no meds. H/o HSV: no outbreaks.  Positive serology for HSV 1 and 2, continue valtrex  for now Back pain: continue heating pad prn with flexeril    Continue routine antenatal care.   Margaret Buddle, MD [employer] [facility] Attending, [facility] 03/05/2023 9:19 AM

## 2023-03-06 DIAGNOSIS — O4692 Antepartum hemorrhage, unspecified, second trimester: Secondary | ICD-10-CM

## 2023-03-06 DIAGNOSIS — Z3A26 26 weeks gestation of pregnancy: Secondary | ICD-10-CM

## 2023-03-06 MED ORDER — CYCLOBENZAPRINE HCL 10 MG PO TABS: 10.0000 mg | ORAL_TABLET | Freq: Three times a day (TID) | ORAL | 0 refills | Status: DC | PRN

## 2023-03-06 MED ORDER — LIDOCAINE 5 % EX PTCH: 1.0000 | MEDICATED_PATCH | CUTANEOUS | 0 refills | Status: DC

## 2023-03-06 MED ORDER — VALACYCLOVIR HCL 500 MG PO TABS: 500.0000 mg | ORAL_TABLET | Freq: Two times a day (BID) | ORAL | 2 refills | Status: DC

## 2023-03-06 NOTE — Plan of Care (Signed)
  Problem: Education: Goal: Knowledge of disease or condition will improve Outcome: Adequate for Discharge Goal: Knowledge of the prescribed therapeutic regimen will improve Outcome: Adequate for Discharge Goal: Individualized Educational Video(s) Outcome: Adequate for Discharge   Problem: Clinical Measurements: Goal: Complications related to the disease process, condition or treatment will be avoided or minimized Outcome: Adequate for Discharge   Problem: Education: Goal: Knowledge of General Education information will improve Description: Including pain rating scale, medication(s)/side effects and non-pharmacologic comfort measures Outcome: Adequate for Discharge   Problem: Health Behavior/Discharge Planning: Goal: Ability to manage health-related needs will improve Outcome: Adequate for Discharge   Problem: Clinical Measurements: Goal: Ability to maintain clinical measurements within normal limits will improve Outcome: Adequate for Discharge Goal: Will remain free from infection Outcome: Adequate for Discharge Goal: Cardiovascular complication will be avoided Outcome: Adequate for Discharge   Problem: Activity: Goal: Risk for activity intolerance will decrease Outcome: Adequate for Discharge   Problem: Coping: Goal: Level of anxiety will decrease Outcome: Adequate for Discharge   Problem: Elimination: Goal: Will not experience complications related to urinary retention Outcome: Adequate for Discharge   Problem: Pain Management: Goal: General experience of comfort will improve Outcome: Adequate for Discharge   Problem: Safety: Goal: Ability to remain free from injury will improve Outcome: Adequate for Discharge

## 2023-03-06 NOTE — Discharge Summary (Signed)
 Antenatal Physician Discharge Summary  Patient ID: Margaret Ingram MRN: [id number] DOB/AGE: August 09, 1995 27 y.o.  Admit date: 02/28/2023 Discharge date: 03/06/2023  Admission Diagnoses: Principal Problem:   Vaginal bleeding during pregnancy Active Problems:   Chronic hypertension   History of preterm labor, current pregnancy, second trimester   Supervision of high risk pregnancy, antepartum   History of incompetent cervix, currently pregnant in second trimester   Cervical cerclage suture present in second trimester   BMI 45.0-49.9, adult (HCC)   Obesity in pregnancy   HSV (herpes simplex virus) infection  Discharge Diagnoses: Resolved vaginal bleeding, same other issues  Prenatal Procedures: NST and ultrasound  Consults: Neonatology  Hospital Course:  Margaret Ingram is a 28 y.o. [id number] with IUP at [redacted]w[redacted]d admitted on 02/28/2023 for vaginal bleeding.  History of cervical incompetence with cerclage in place. Ultrasound showed  4 cm cervical length, exam showed cerclage in place. Some blood seen in vagina. She was initially started on magnesium  sulfate for tocolysis and neuroprotection and also received betamethasone  x 2 doses. She was seen by Neonatology during her stay.  She was observed, fetal heart rate monitoring remained reassuring, and she had no signs/symptoms of further bleeding,  progressing preterm labor or other maternal-fetal concerns.  On 03/06/2023, she was deemed stable for discharge to home with outpatient follow up.  Discharge Exam: Temp:  [97.8 F (36.6 C)-98.6 F (37 C)] 98.1 F (36.7 C) (01/15 0733) Pulse Rate:  [73-86] 75 (01/15 0733) Resp:  [17-80] 17 (01/15 0733) BP: (104-142)/(54-97) 128/75 (01/15 0733) SpO2:  [95 %-100 %] 98 % (01/15 0733) Physical Examination: CONSTITUTIONAL: Well-developed, well-nourished female in no acute distress.  HENT:  Normocephalic, atraumatic, External right and left ear normal.  EYES: Conjunctivae and EOM are normal. Pupils are  equal, round, and reactive to light. No scleral icterus.  NECK: Normal range of motion, supple, no masses SKIN: Skin is warm and dry. No rash noted. Not diaphoretic. No erythema. No pallor. NEUROLOGIC: Alert and oriented to person, place, and time. Normal reflexes, muscle tone coordination. No cranial nerve deficit noted. PSYCHIATRIC: Normal mood and affect. Normal behavior. Normal judgment and thought content. CARDIOVASCULAR: Normal heart rate noted, regular rhythm RESPIRATORY: Effort and breath sounds normal, no problems with respiration noted MUSCULOSKELETAL: Normal range of motion. No edema and no tenderness. 2+ distal pulses. ABDOMEN: Soft, nontender, nondistended, gravid. CERVIX: Dilation: Closed Exam by:: Aquilla Knapp, MD on admission  Fetal monitoring: FHR: 140 bpm, Variability: moderate, Accelerations: Present, Decelerations: Absent  Uterine activity: No contractions  Significant Diagnostic Studies:  Results for orders placed or performed during the hospital encounter of 02/28/23 (from the past week)  Urinalysis, Routine w reflex microscopic -Urine, Clean Catch   Collection Time: 02/28/23  7:57 PM  Result Value Ref Range   Color, Urine YELLOW YELLOW   APPearance HAZY (A) CLEAR   Specific Gravity, Urine 1.029 1.005 - 1.030   pH 6.0 5.0 - 8.0   Glucose, UA NEGATIVE NEGATIVE mg/dL   Hgb urine dipstick LARGE (A) NEGATIVE   Bilirubin Urine NEGATIVE NEGATIVE   Ketones, ur NEGATIVE NEGATIVE mg/dL   Protein, ur 30 (A) NEGATIVE mg/dL   Nitrite NEGATIVE NEGATIVE   Leukocytes,Ua NEGATIVE NEGATIVE   RBC / HPF >50 0 - 5 RBC/hpf   WBC, UA 0-5 0 - 5 WBC/hpf   Bacteria, UA RARE (A) NONE SEEN   Squamous Epithelial / HPF 0-5 0 - 5 /HPF   Mucus PRESENT   Protime-INR   Collection Time: 02/28/23  7:57 PM  Result Value Ref Range   Prothrombin Time 13.9 11.4 - 15.2 seconds   INR 1.1 0.8 - 1.2  APTT   Collection Time: 02/28/23  7:57 PM  Result Value Ref Range   aPTT 27 24 - 36 seconds   Fibrinogen    Collection Time: 02/28/23  7:57 PM  Result Value Ref Range   Fibrinogen  583 (H) 210 - 475 mg/dL  Type and screen [facility]   Collection Time: 02/28/23  7:57 PM  Result Value Ref Range   ABO/RH(D) A POS    Antibody Screen NEG    Sample Expiration      03/03/2023,2359 Performed at [facility], [address], [place], [place] [address]   Kleihauer-Betke stain   Collection Time: 02/28/23  7:58 PM  Result Value Ref Range   Fetal Cells % 0 %   Quantitation Fetal Hemoglobin 0.0000 mL   # Vials RhIg NOT INDICATED   RPR   Collection Time: 02/28/23  7:58 PM  Result Value Ref Range   RPR Ser Ql NON REACTIVE NON REACTIVE  Comprehensive metabolic panel   Collection Time: 02/28/23  7:58 PM  Result Value Ref Range   Sodium 138 135 - 145 mmol/L   Potassium 3.7 3.5 - 5.1 mmol/L   Chloride 108 98 - 111 mmol/L   CO2 24 22 - 32 mmol/L   Glucose, Bld 95 70 - 99 mg/dL   BUN 8 6 - 20 mg/dL   Creatinine, Ser 2.13 0.44 - 1.00 mg/dL   Calcium  9.0 8.9 - 10.3 mg/dL   Total Protein 6.8 6.5 - 8.1 g/dL   Albumin 3.1 (L) 3.5 - 5.0 g/dL   AST 14 (L) 15 - 41 U/L   ALT 14 0 - 44 U/L   Alkaline Phosphatase 52 38 - 126 U/L   Total Bilirubin 0.4 0.0 - 1.2 mg/dL   GFR, Estimated >08 >65 mL/min   Anion gap 6 5 - 15  CBC with Differential/Platelet   Collection Time: 02/28/23  7:58 PM  Result Value Ref Range   WBC 6.2 4.0 - 10.5 K/uL   RBC 3.79 (L) 3.87 - 5.11 MIL/uL   Hemoglobin 11.1 (L) 12.0 - 15.0 g/dL   HCT 78.4 (L) 69.6 - 29.5 %   MCV 89.2 80.0 - 100.0 fL   MCH 29.3 26.0 - 34.0 pg   MCHC 32.8 30.0 - 36.0 g/dL   RDW 28.4 13.2 - 44.0 %   Platelets 155 150 - 400 K/uL   nRBC 0.0 0.0 - 0.2 %   Neutrophils Relative % 60 %   Neutro Abs 3.6 1.7 - 7.7 K/uL   Lymphocytes Relative 32 %   Lymphs Abs 2.0 0.7 - 4.0 K/uL   Monocytes Relative 8 %   Monocytes Absolute 0.5 0.1 - 1.0 K/uL   Eosinophils Relative 0 %   Eosinophils Absolute 0.0 0.0 - 0.5 K/uL   Basophils  Relative 0 %   Basophils Absolute 0.0 0.0 - 0.1 K/uL   Immature Granulocytes 0 %   Abs Immature Granulocytes 0.01 0.00 - 0.07 K/uL  CBC   Collection Time: 03/01/23  4:22 AM  Result Value Ref Range   WBC 7.0 4.0 - 10.5 K/uL   RBC 3.86 (L) 3.87 - 5.11 MIL/uL   Hemoglobin 11.3 (L) 12.0 - 15.0 g/dL   HCT 10.2 (L) 72.5 - 36.6 %   MCV 87.8 80.0 - 100.0 fL   MCH 29.3 26.0 - 34.0 pg   MCHC  33.3 30.0 - 36.0 g/dL   RDW 54.0 98.1 - 19.1 %   Platelets 158 150 - 400 K/uL   nRBC 0.0 0.0 - 0.2 %  CBC   Collection Time: 03/01/23 11:33 AM  Result Value Ref Range   WBC 6.5 4.0 - 10.5 K/uL   RBC 3.95 3.87 - 5.11 MIL/uL   Hemoglobin 11.5 (L) 12.0 - 15.0 g/dL   HCT 47.8 (L) 29.5 - 62.1 %   MCV 88.6 80.0 - 100.0 fL   MCH 29.1 26.0 - 34.0 pg   MCHC 32.9 30.0 - 36.0 g/dL   RDW 30.8 65.7 - 84.6 %   Platelets 171 150 - 400 K/uL   nRBC 0.0 0.0 - 0.2 %  Cervicovaginal ancillary only   Collection Time: 03/03/23  7:35 AM  Result Value Ref Range   Neisseria Gonorrhea Negative    Chlamydia Negative    Trichomonas Negative    Comment Normal Reference Range Trichomonas - Negative    Comment Normal Reference Ranger Chlamydia - Negative    Comment      Normal Reference Range Neisseria Gonorrhea - Negative  Wet prep, genital   Collection Time: 03/03/23 10:45 AM   Specimen: Vaginal  Result Value Ref Range   Yeast Wet Prep HPF POC NONE SEEN NONE SEEN   Trich, Wet Prep NONE SEEN NONE SEEN   Clue Cells Wet Prep HPF POC NONE SEEN NONE SEEN   WBC, Wet Prep HPF POC >=10 (A) <10   Sperm NONE SEEN   Type and screen [facility]   Collection Time: 03/04/23  1:02 AM  Result Value Ref Range   ABO/RH(D) A POS    Antibody Screen NEG    Sample Expiration      03/07/2023,2359 Performed at [facility], [address], [place], [place] [address]   Results for orders placed or performed in visit on 02/27/23 (from the past week)  CBC   Collection Time: 02/27/23 12:50 PM  Result Value Ref  Range   WBC 6.0 3.4 - 10.8 x10E3/uL   RBC 4.01 3.77 - 5.28 x10E6/uL   Hemoglobin 11.6 11.1 - 15.9 g/dL   Hematocrit 28.4 13.2 - 46.6 %   MCV 88 79 - 97 fL   MCH 28.9 26.6 - 33.0 pg   MCHC 33.0 31.5 - 35.7 g/dL   RDW 44.0 (L) 10.2 - 72.5 %   Platelets 167 150 - 450 x10E3/uL  Comp Met (CMET)   Collection Time: 02/27/23 12:50 PM  Result Value Ref Range   Glucose 79 70 - 99 mg/dL   BUN 10 6 - 20 mg/dL   Creatinine, Ser 3.66 (L) 0.57 - 1.00 mg/dL   eGFR 440 >34 VQ/QVZ/5.63   BUN/Creatinine Ratio 19 9 - 23   Sodium 139 134 - 144 mmol/L   Potassium 4.1 3.5 - 5.2 mmol/L   Chloride 104 96 - 106 mmol/L   CO2 21 20 - 29 mmol/L   Calcium  9.1 8.7 - 10.2 mg/dL   Total Protein 6.8 6.0 - 8.5 g/dL   Albumin 3.9 (L) 4.0 - 5.0 g/dL   Globulin, Total 2.9 1.5 - 4.5 g/dL   Bilirubin Total <8.7 0.0 - 1.2 mg/dL   Alkaline Phosphatase 66 44 - 121 IU/L   AST 10 0 - 40 IU/L   ALT 11 0 - 32 IU/L   US  MFM OB COMP + 14 WK Result Date: 03/01/2023 ----------------------------------------------------------------------  OBSTETRICS REPORT                       (  Signed Final 03/01/2023 11:42 am) ---------------------------------------------------------------------- Patient Info  ID #:       [id number]                          D.O.B.:  1995-04-01 (27 yrs)(F)  Name:       Margaret Ingram               Visit Date: 02/28/2023 10:36 pm ---------------------------------------------------------------------- Performed By  Attending:        Cassandria Clever MD        Referred By:      [facility] MAU/Triage  Performed By:     Ennis Hart          [facility] ---------------------------------------------------------------------- Orders  #  Description                           Code        Ordered By  1  US  MFM OB COMP + 14 WK                76805.01    CHARLIE PICKENS  2  US  MFM OB TRANSVAGINAL                46962.9     CHARLIE PICKENS  ----------------------------------------------------------------------  #  Order #                     Accession #                Episode #  1  [id number]                   [id number]                 [id number]  2  [id number]                   [id number]                 [id number] ---------------------------------------------------------------------- Indications  Encounter for antenatal screening for          Z36.3  malformations  Vaginal bleeding in pregnancy, second          O46.92  trimester  Poor obstetric history: Previous preterm       O09.219  delivery, antepartum ([redacted]w[redacted]d, [redacted]w[redacted]d, 32w)  Cervical cerclage suture present, second       O34.32  trimester  Obesity complicating pregnancy, second         O99.212  trimester  [redacted] weeks gestation of pregnancy                Z3A.25 ---------------------------------------------------------------------- Vital Signs  Weight (lb): 267                               Height:        5'2"  BMI:         48.83 ---------------------------------------------------------------------- Fetal Evaluation  Num Of Fetuses:  1  Fetal Heart Rate(bpm):  140  Cardiac Activity:       Observed  Presentation:           Cephalic  Placenta:               Anterior  P. Cord Insertion:      Visualized  Amniotic Fluid  AFI FV:      Within normal limits                              Largest Pocket(cm)                              5.7  Comment:    No placental abruption or previa identified. ---------------------------------------------------------------------- Biometry  BPD:      60.6  mm     G. Age:  24w 5d         25  %    CI:        73.15   %    70 - 86                                                          FL/HC:      20.3   %    18.7 - 20.3  HC:      225.2  mm     G. Age:  24w 4d         12  %    HC/AC:      1.13        1.04 - 1.22  AC:      198.8  mm     G. Age:  24w 4d         23  %    FL/BPD:     75.6   %    71 - 87  FL:       45.8  mm     G. Age:  25w 1d         37  %    FL/AC:      23.0   %     20 - 24  CER:      28.9  mm     G. Age:  25w 3d         60  %  Est. FW:     734  gm    1 lb 10 oz      25  % ---------------------------------------------------------------------- OB History  Gravidity:    6         Term:   2        Prem:   3 ---------------------------------------------------------------------- Gestational Age  U/S Today:     24w 5d                                        EDD:   06/15/23  Best:          25w 1d     Det. ByDelphine Fiedler         EDD:   06/12/23                                      (  11/14/22) ---------------------------------------------------------------------- Anatomy  Cranium:               Appears normal         LVOT:                   Appears normal  Cavum:                 Appears normal         Aortic Arch:            Appears normal  Ventricles:            Appears normal         Ductal Arch:            Not well visualized  Choroid Plexus:        Appears normal         Diaphragm:              Appears normal  Cerebellum:            Appears normal         Stomach:                Appears normal, left                                                                        sided  Posterior Fossa:       Appears normal         Abdomen:                Appears normal  Nuchal Fold:           Not applicable (>20    Abdominal Wall:         Appears nml (cord                         wks GA)                                        insert, abd wall)  Face:                  Orbits appear          Cord Vessels:           Appears normal (3                         normal                                         vessel cord)  Lips:                  Not well visualized    Kidneys:                Appear normal  Palate:                Not well visualized    Bladder:  Appears normal  Thoracic:              Appears normal         Spine:                  Not well visualized  Heart:                 Appears normal         Upper Extremities:      Not well visualized                         (4CH,  axis, and                         situs)  RVOT:                  Appears normal         Lower Extremities:      Not well visualized  Other:  Fetus appears to be a female. ---------------------------------------------------------------------- Cervix Uterus Adnexa  Cervix  Length:              4  cm.  Normal appearance by transvaginal scan Cerclage visualized.  Uterus  No abnormality visualized.  Right Ovary  Within normal limits.  Left Ovary  Not visualized.  Adnexa  No abnormality visualized ---------------------------------------------------------------------- Impression  Patient presented to the MAU with complaints of vaginal  bleeding and abdominal pain.  She had prophylactic cerclage  in this pregnancy at [facility].  Fetal biometry is consistent with her previously established  dates.  Amniotic fluid is normal good fetal activity seen.  Fetal  anatomical survey is limited but appears normal.  Placenta  appears normal with no evidence of retroplacental  hemorrhage.  We performed a transvaginal ultrasound to evaluate the  cervix.  The cervix measures 4 cm and the cerclage is in  place.  This study was remotely read . ----------------------------------------------------------------------                 Cassandria Clever, MD Electronically Signed Final Report   03/01/2023 11:42 am ----------------------------------------------------------------------   US  MFM OB Transvaginal Result Date: 03/01/2023 ----------------------------------------------------------------------  OBSTETRICS REPORT                       (Signed Final 03/01/2023 11:42 am) ---------------------------------------------------------------------- Patient Info  ID #:       [id number]                          D.O.B.:  [birth year] (27 yrs)(F)  Name:       Margaret Ingram               Visit Date: 02/28/2023 10:36 pm ---------------------------------------------------------------------- Performed By  Attending:        Cassandria Clever MD         Referred By:      [facility] MAU/Triage  Performed By:     Ennis Hart          [facility] ---------------------------------------------------------------------- Orders  #  Description                           Code        Ordered By  1  US  MFM OB COMP + 14 WK                76805.01    CHARLIE PICKENS  2  US  MFM OB TRANSVAGINAL                [id number]     CHARLIE PICKENS ----------------------------------------------------------------------  #  Order #                     Accession #                Episode #  1  [id number]                   [id number]                 [id number]  2  [id number]                   [id number]                 [id number] ---------------------------------------------------------------------- Indications  Encounter for antenatal screening for          Z36.3  malformations  Vaginal bleeding in pregnancy, second          O46.92  trimester  Poor obstetric history: Previous preterm       O09.219  delivery, antepartum ([redacted]w[redacted]d, [redacted]w[redacted]d, 32w)  Cervical cerclage suture present, second       O34.32  trimester  Obesity complicating pregnancy, second         O99.212  trimester  [redacted] weeks gestation of pregnancy                Z3A.25 ---------------------------------------------------------------------- Vital Signs  Weight (lb): 267                               Height:        5'2"  BMI:         48.83 ---------------------------------------------------------------------- Fetal Evaluation  Num Of Fetuses:         1  Fetal Heart Rate(bpm):  140  Cardiac Activity:       Observed  Presentation:           Cephalic  Placenta:               Anterior  P. Cord Insertion:      Visualized  Amniotic Fluid  AFI FV:      Within normal limits                              Largest Pocket(cm)                              5.7  Comment:    No placental abruption or previa identified.  ---------------------------------------------------------------------- Biometry  BPD:      60.6  mm     G. Age:  24w 5d         25  %    CI:        73.15   %  70 - 86                                                          FL/HC:      20.3   %    18.7 - 20.3  HC:      225.2  mm     G. Age:  24w 4d         12  %    HC/AC:      1.13        1.04 - 1.22  AC:      198.8  mm     G. Age:  24w 4d         23  %    FL/BPD:     75.6   %    71 - 87  FL:       45.8  mm     G. Age:  25w 1d         37  %    FL/AC:      23.0   %    20 - 24  CER:      28.9  mm     G. Age:  25w 3d         60  %  Est. FW:     734  gm    1 lb 10 oz      25  % ---------------------------------------------------------------------- OB History  Gravidity:    6         Term:   2        Prem:   3 ---------------------------------------------------------------------- Gestational Age  U/S Today:     24w 5d                                        EDD:   06/15/23  Best:          25w 1d     Det. By:  Delphine Fiedler         EDD:   06/12/23                                      (11/14/22) ---------------------------------------------------------------------- Anatomy  Cranium:               Appears normal         LVOT:                   Appears normal  Cavum:                 Appears normal         Aortic Arch:            Appears normal  Ventricles:            Appears normal         Ductal Arch:            Not well visualized  Choroid Plexus:        Appears normal         Diaphragm:  Appears normal  Cerebellum:            Appears normal         Stomach:                Appears normal, left                                                                        sided  Posterior Fossa:       Appears normal         Abdomen:                Appears normal  Nuchal Fold:           Not applicable (>20    Abdominal Wall:         Appears nml (cord                         wks GA)                                        insert, abd wall)  Face:                  Orbits  appear          Cord Vessels:           Appears normal (3                         normal                                         vessel cord)  Lips:                  Not well visualized    Kidneys:                Appear normal  Palate:                Not well visualized    Bladder:                Appears normal  Thoracic:              Appears normal         Spine:                  Not well visualized  Heart:                 Appears normal         Upper Extremities:      Not well visualized                         (4CH, axis, and                         situs)  RVOT:  Appears normal         Lower Extremities:      Not well visualized  Other:  Fetus appears to be a female. ---------------------------------------------------------------------- Cervix Uterus Adnexa  Cervix  Length:              4  cm.  Normal appearance by transvaginal scan Cerclage visualized.  Uterus  No abnormality visualized.  Right Ovary  Within normal limits.  Left Ovary  Not visualized.  Adnexa  No abnormality visualized ---------------------------------------------------------------------- Impression  Patient presented to the MAU with complaints of vaginal  bleeding and abdominal pain.  She had prophylactic cerclage  in this pregnancy at [facility].  Fetal biometry is consistent with her previously established  dates.  Amniotic fluid is normal good fetal activity seen.  Fetal  anatomical survey is limited but appears normal.  Placenta  appears normal with no evidence of retroplacental  hemorrhage.  We performed a transvaginal ultrasound to evaluate the  cervix.  The cervix measures 4 cm and the cerclage is in  place.  This study was remotely read . ----------------------------------------------------------------------                 Cassandria Clever, MD Electronically Signed Final Report   03/01/2023 11:42 am ----------------------------------------------------------------------    Future Appointments  Date Time  Provider Department Center  03/14/2023  8:15 AM [facility] NURSE [facility]  03/14/2023  8:30 AM [facility] US1 [facility]  03/14/2023  9:55 AM Granville Layer, MD [facility]    Discharge Condition: Stable  Discharge disposition: 01-Home or Self Care       Discharge Instructions     Discharge activity:  No Restrictions   Complete by: As directed    Discharge diet:  No restrictions   Complete by: As directed    Fetal Kick Count:  Lie on our left side for one hour after a meal, and count the number of times your baby kicks.  If it is less than 5 times, get up, move around and drink some juice.  Repeat the test 30 minutes later.  If it is still less than 5 kicks in an hour, notify your doctor.   Complete by: As directed    No sexual activity restrictions   Complete by: As directed    Notify physician for a general feeling that "something is not right"   Complete by: As directed    Notify physician for leaking of fluid   Complete by: As directed    Notify physician for vaginal bleeding   Complete by: As directed    PRETERM LABOR:  Includes any of the follwing symptoms that occur between 20 - [redacted] weeks gestation.  If these symptoms are not stopped, preterm labor can result in preterm delivery, placing your baby at risk   Complete by: As directed       Allergies as of 03/06/2023       Reactions   Azithromycin  Nausea And Vomiting, Nausea Only   Other Rash   *[employer] Band-Aid*        Medication List     STOP taking these medications    ondansetron  8 MG disintegrating tablet Commonly known as: Zofran  ODT       TAKE these medications    acetaminophen  325 MG tablet Commonly known as: TYLENOL  Take 650 mg by mouth every 6 (six) hours as needed for mild pain or headache.   Adult Blood Pressure Cuff Lg Kit 1 kit by Does  not apply route daily. Call or return to the hospital if your blood pressure is running higher than 160 for the top number or 110 for the bottom number.  Check  your blood pressure if you experience a headache that does not resolve or changes in your vision or other severe symptoms.   aspirin  81 MG chewable tablet Chew by mouth daily.   cyclobenzaprine  10 MG tablet Commonly known as: FLEXERIL  Take 1 tablet (10 mg total) by mouth 3 (three) times daily as needed for muscle spasms.   lidocaine  5 % Commonly known as: LIDODERM  Place 1 patch onto the skin daily. Remove & Discard patch within 12 hours or as directed by MD   multivitamin-prenatal 27-0.8 MG Tabs tablet Take 1 tablet by mouth daily.   NIFEdipine  30 MG 24 hr tablet Commonly known as: ADALAT  CC Take 2 tablets (60 mg total) by mouth daily.   valACYclovir  500 MG tablet Commonly known as: VALTREX  Take 1 tablet (500 mg total) by mouth 2 (two) times daily.         Total discharge time: 30 minutes   Signed: Lenoard Rad M.D. 03/06/2023, 9:18 AM

## 2023-03-07 ENCOUNTER — Ambulatory Visit: Payer: MEDICAID

## 2023-03-07 ENCOUNTER — Other Ambulatory Visit: Payer: MEDICAID

## 2023-03-08 ENCOUNTER — Encounter: Payer: MEDICAID | Admitting: Family Medicine

## 2023-03-13 ENCOUNTER — Encounter: Payer: MEDICAID | Admitting: Obstetrics & Gynecology

## 2023-03-14 ENCOUNTER — Other Ambulatory Visit: Payer: Self-pay

## 2023-03-14 ENCOUNTER — Encounter: Payer: MEDICAID | Admitting: Obstetrics & Gynecology

## 2023-03-14 ENCOUNTER — Other Ambulatory Visit: Payer: Self-pay | Admitting: Family Medicine

## 2023-03-14 ENCOUNTER — Ambulatory Visit: Payer: MEDICAID | Attending: Family Medicine

## 2023-03-14 ENCOUNTER — Encounter: Payer: MEDICAID | Admitting: Family Medicine

## 2023-03-14 ENCOUNTER — Encounter: Payer: Self-pay | Admitting: *Deleted

## 2023-03-14 ENCOUNTER — Other Ambulatory Visit: Payer: Self-pay | Admitting: *Deleted

## 2023-03-14 ENCOUNTER — Ambulatory Visit (HOSPITAL_BASED_OUTPATIENT_CLINIC_OR_DEPARTMENT_OTHER): Payer: MEDICAID | Admitting: Maternal & Fetal Medicine

## 2023-03-14 ENCOUNTER — Ambulatory Visit: Payer: MEDICAID | Admitting: *Deleted

## 2023-03-14 VITALS — BP 140/89 | HR 87

## 2023-03-14 DIAGNOSIS — Z8632 Personal history of gestational diabetes: Secondary | ICD-10-CM | POA: Insufficient documentation

## 2023-03-14 DIAGNOSIS — O365921 Maternal care for other known or suspected poor fetal growth, second trimester, fetus 1: Secondary | ICD-10-CM | POA: Diagnosis present

## 2023-03-14 DIAGNOSIS — O365920 Maternal care for other known or suspected poor fetal growth, second trimester, not applicable or unspecified: Secondary | ICD-10-CM | POA: Diagnosis not present

## 2023-03-14 DIAGNOSIS — O09212 Supervision of pregnancy with history of pre-term labor, second trimester: Secondary | ICD-10-CM | POA: Insufficient documentation

## 2023-03-14 DIAGNOSIS — O365990 Maternal care for other known or suspected poor fetal growth, unspecified trimester, not applicable or unspecified: Secondary | ICD-10-CM | POA: Diagnosis present

## 2023-03-14 DIAGNOSIS — O0990 Supervision of high risk pregnancy, unspecified, unspecified trimester: Secondary | ICD-10-CM

## 2023-03-14 DIAGNOSIS — O99212 Obesity complicating pregnancy, second trimester: Secondary | ICD-10-CM

## 2023-03-14 DIAGNOSIS — I10 Essential (primary) hypertension: Secondary | ICD-10-CM

## 2023-03-14 DIAGNOSIS — O3432 Maternal care for cervical incompetence, second trimester: Secondary | ICD-10-CM | POA: Insufficient documentation

## 2023-03-14 DIAGNOSIS — O09292 Supervision of pregnancy with other poor reproductive or obstetric history, second trimester: Secondary | ICD-10-CM

## 2023-03-14 DIAGNOSIS — O10919 Unspecified pre-existing hypertension complicating pregnancy, unspecified trimester: Secondary | ICD-10-CM | POA: Insufficient documentation

## 2023-03-14 DIAGNOSIS — O99210 Obesity complicating pregnancy, unspecified trimester: Secondary | ICD-10-CM

## 2023-03-14 DIAGNOSIS — E669 Obesity, unspecified: Secondary | ICD-10-CM

## 2023-03-14 DIAGNOSIS — O365931 Maternal care for other known or suspected poor fetal growth, third trimester, fetus 1: Secondary | ICD-10-CM | POA: Insufficient documentation

## 2023-03-14 DIAGNOSIS — Z3A27 27 weeks gestation of pregnancy: Secondary | ICD-10-CM | POA: Diagnosis not present

## 2023-03-14 DIAGNOSIS — Z8759 Personal history of other complications of pregnancy, childbirth and the puerperium: Secondary | ICD-10-CM | POA: Insufficient documentation

## 2023-03-14 DIAGNOSIS — O43192 Other malformation of placenta, second trimester: Secondary | ICD-10-CM

## 2023-03-14 DIAGNOSIS — O34219 Maternal care for unspecified type scar from previous cesarean delivery: Secondary | ICD-10-CM

## 2023-03-14 DIAGNOSIS — O10012 Pre-existing essential hypertension complicating pregnancy, second trimester: Secondary | ICD-10-CM | POA: Diagnosis not present

## 2023-03-14 NOTE — Progress Notes (Signed)
Patient information  Patient Name: Margaret Ingram  Patient MRN:   [id number]  Referring practice: MFM Referring Provider: [facility])  MFM CONSULT  Margaret Ingram is a 28 y.o. [phone] at [redacted]w[redacted]d here for ultrasound and consultation. Patient Active Problem List   Diagnosis Date Noted   BMI 45.0-49.9, adult (HCC) 03/01/2023   Obesity in pregnancy 03/01/2023   HSV (herpes simplex virus) infection 03/01/2023   Vaginal bleeding during pregnancy 02/28/2023   History of incompetent cervix, currently pregnant in second trimester 02/28/2023   Cervical cerclage suture present in second trimester 02/28/2023   Supervision of high risk pregnancy, antepartum 02/19/2023   History of preterm labor, current pregnancy, second trimester    History of severe pre-eclampsia 08/17/2018   Chronic hypertension 08/17/2018   Depression 08/17/2018   History of gestational diabetes mellitus (GDM) 03/26/2018    Margaret Ingram is doing well today with no acute concerns. She denies contractions, bleeding, or loss of fluid and reports good fetal movement.  The patient was receiving prenatal care in [facility] and then moved to [place].  She was referred to [facility] prior to moving due to a history of preterm birth as well as a previous cerclage and a prior pregnancy.  She received a cerclage with [facility] prior to moving to [place].  She has now moved back and is living in [place] therefore she transferred care here.  RE FGR: I discussed the diagnosis, management and prognosis of fetal growth restriction (FGR) with the patient today. I discussed the various causes of growth restriction including constitutionally small fetus, placental insufficiency, genetic problems and chronic maternal disease. Currently there is no evidence of sonographic stigmata suggesting infection or aneuploidy.  I discussed the most likely cause of her fetal growth restriction is   placental insufficiency. We discussed it is often challenging to differentiate between a fetus that is constitutionally small but is fulfilling its growth potential and a fetus that is not fulfilling its growth potential because of an underlying pathologic condition. Approximately 70% of fetuses with birth weight below the 10th percentile for gestational age are constitutionally small; in the remaining 30%, the cause of the small size is pathologic, meeting intrauterine growth restriction definition.  However, the smaller the fetus in the earlier onset of growth restriction the more likely there is a pathologic process causing the growth restriction.  We also discussed the potential for genetic or infectious causes which would only be diagnosed with amniocentesis.  The patient is not interested in this at this time given that there is no treatment for either condition.  I also discussed the importance of antenatal fetal surveillance including antenatal testing and umbilical artery Doppler assessment to reduce the risk of stillbirth.  I discussed the management going forward in the pregnancy with potential alteration in the timing of delivery.  I also discussed the increased risk of preeclampsia in addition to growth restriction which are often coincident especially with early onset growth restriction.Currently she feels well and denies headache, vision changes, right upper quadrant pain, contractions, vaginal bleeding or loss of fluid.  She reports good fetal movement.   RE history of short cervix with cerclage: She has a history of preterm delivery insetting of mid-trimester PPROM. During her next delivery, she was undergoing cervical length screening when found to have cervical shortening and dilation where she received a cerclage in that pregnancy.  In December 2024 during this pregnancy she had a cerclage placed  by physicians at [facility].  The patient was receiving her care in [facility] before she moved to  [place].  When she moved back she moved to [facility] and therefore the that is why she did not go back to [facility].  Currently she is doing well and has no complaints.  Cerclage will be removed around 37 weeks or sooner if indicated  RE vaginal bleeding with concern for marginal abruption: On 02/28/2023 the patient was admitted to [facility] for vaginal bleeding with a probable marginal abruption.  She received betamethasone and magnesium sulfate and had NICU consultation as well.  She was discharged approximately 5 days later.  She has done well since that time and denies any other bleeding episodes or cramping.  She knows to continue to monitor her symptoms.  RE history of cesarean delivery x 2: Desires repeat  RE chronic hypertension with a history of preeclampsia: Blood pressure is doing well today.  She denies any signs or symptoms of preeclampsia.  Compliant with aspirin therapy.   Sonographic findings Single intrauterine pregnancy. Fetal cardiac activity:  Observed and appears normal. Presentation: Cephalic. Interval fetal anatomy appears normal but is very limited. Fetal biometry shows the estimated fetal weight at the 1.1 percentile and the abdominal circumference at the 1 percentile.  Amniotic fluid: Within normal limits.  MVP: 4.19 cm. Placenta: Anterior. Umbilical artery dopplers findings: -S/D:3.98 which are normal at this gestational age.  -Absent end-diastolic flow: No.  -Reversed end-diastolic flow:  No. NST was reassuring for gestational age.   There are limitations of prenatal ultrasound such as the inability to detect certain abnormalities due to poor visualization. Various factors such as fetal position, gestational age and maternal body habitus may increase the difficulty in visualizing the fetal anatomy.    Recommendations -EDD is Estimated Date of Delivery: 06/11/23 based on an early [place]. -Detailed ultrasound was done today without abnormalities but is  limited. -Baseline preeclampsia labs previously completed.  -Needs GTT asap.   -Continue Aspirin 81 mg for preeclampsia prophylaxis -Continue weekly UA dopplers until delivery.  -Continue to avoid tobacco use and exposure to harmful substances -Weekly antenatal testing (typically NST and then add BPP at 32 weeks )  -Twice weekly antenatal testing may be indicated if there is very poor fetal growth, if the UA Dopplers have peroids of absent end diastolic flow or if the antenatal testing is equivocal.  -Betamethasone may be indicated if there is concern for needing to deliver within 7 days.  At this time there is no indication present.  She already received a course of betamethasone on 02/28/2023 and therefore will need to be at least 7 to 14 days past this point to receive another course. -Serial growth [place] every 3 weeks until delivery  -Delivery timing will be pending clinical course but likely around 37 weeks -Cerclage to be removed at 36-37 weeks or sooner if indicated -With cases of early onset growth restriction with onset at less than 32 weeks, amniocentesis to assess chromosomal MicroArray and CMV is recommended.  The patient was offered this and declined.    Review of Systems: A review of systems was performed and was negative except per HPI   Vitals and Physical Exam    03/14/2023    9:02 AM 03/14/2023    8:12 AM 03/06/2023    7:33 AM  Vitals with BMI  Systolic 131 140 409  Diastolic 79 89 75  Pulse 89 87 75    Sitting comfortably on  the sonogram table Nonlabored breathing Normal rate and rhythm Abdomen is nontender  Past pregnancies OB History  Gravida Para Term Preterm AB Living  6 5 2 3  4   SAB IAB Ectopic Multiple Live Births     0 5    # Outcome Date GA Lbr Len/2nd Weight Sex Type Anes PTL Lv  6 Current           5 Preterm 04/05/21 [redacted]w[redacted]d  5 lb 1.3 oz (2.305 kg) F CS-LTranv Spinal  LIV     Complications: Severe pre-eclampsia  4 Preterm 06/22/19 [redacted]w[redacted]d  2 lb 5 oz  (1.05 kg) M CS-LTranv Gen  DEC     Complications: Preterm labor, Preterm premature rupture of membranes in third trimester  3 Preterm 08/2018 [redacted]w[redacted]d    Vag-Spont   LIV     Complications: Preterm labor  2 Term 08/12/17 [redacted]w[redacted]d   M Vag-Spont   LIV     Complications: Preeclampsia  1 Term 02/03/16 [redacted]w[redacted]d   M Vag-Spont   LIV     Complications: Preeclampsia    I spent 60 minutes reviewing the patients chart, including labs and images as well as counseling the patient about her medical conditions. Greater than 50% of the time was spent in direct face-to-face patient counseling.  Braxton Feathers  MFM, [facility]   03/14/2023  12:26 PM

## 2023-03-14 NOTE — Procedures (Signed)
Margaret Ingram 26-Sep-1995 [redacted]w[redacted]d  Fetus A Non-Stress Test Interpretation for 03/14/23  NST only  Indication: IUGR  Fetal Heart Rate A Mode: External Baseline Rate (A): 135 bpm Variability: Moderate Accelerations: 10 x 10 Decelerations: None Multiple birth?: No  Uterine Activity Mode: Palpation Contraction Frequency (min): none Resting Tone Palpated: Relaxed  Interpretation (Fetal Testing) Nonstress Test Interpretation: Reactive Overall Impression: Reassuring for gestational age Comments: Dr. Darra Lis reviewed tracing

## 2023-03-20 ENCOUNTER — Ambulatory Visit: Payer: MEDICAID | Admitting: Obstetrics & Gynecology

## 2023-03-20 ENCOUNTER — Other Ambulatory Visit: Payer: Self-pay

## 2023-03-20 VITALS — BP 135/84 | HR 96 | Wt 272.0 lb

## 2023-03-20 DIAGNOSIS — O09213 Supervision of pregnancy with history of pre-term labor, third trimester: Secondary | ICD-10-CM

## 2023-03-20 DIAGNOSIS — Z23 Encounter for immunization: Secondary | ICD-10-CM | POA: Diagnosis not present

## 2023-03-20 DIAGNOSIS — Z8759 Personal history of other complications of pregnancy, childbirth and the puerperium: Secondary | ICD-10-CM

## 2023-03-20 DIAGNOSIS — Z3009 Encounter for other general counseling and advice on contraception: Secondary | ICD-10-CM

## 2023-03-20 DIAGNOSIS — O09293 Supervision of pregnancy with other poor reproductive or obstetric history, third trimester: Secondary | ICD-10-CM

## 2023-03-20 DIAGNOSIS — O3433 Maternal care for cervical incompetence, third trimester: Secondary | ICD-10-CM

## 2023-03-20 DIAGNOSIS — O365931 Maternal care for other known or suspected poor fetal growth, third trimester, fetus 1: Secondary | ICD-10-CM

## 2023-03-20 DIAGNOSIS — O0993 Supervision of high risk pregnancy, unspecified, third trimester: Secondary | ICD-10-CM | POA: Diagnosis not present

## 2023-03-20 DIAGNOSIS — O10919 Unspecified pre-existing hypertension complicating pregnancy, unspecified trimester: Secondary | ICD-10-CM

## 2023-03-20 DIAGNOSIS — Z8632 Personal history of gestational diabetes: Secondary | ICD-10-CM

## 2023-03-20 DIAGNOSIS — O0990 Supervision of high risk pregnancy, unspecified, unspecified trimester: Secondary | ICD-10-CM

## 2023-03-20 DIAGNOSIS — O10913 Unspecified pre-existing hypertension complicating pregnancy, third trimester: Secondary | ICD-10-CM | POA: Diagnosis not present

## 2023-03-20 DIAGNOSIS — Z98891 History of uterine scar from previous surgery: Secondary | ICD-10-CM

## 2023-03-20 DIAGNOSIS — Z3A28 28 weeks gestation of pregnancy: Secondary | ICD-10-CM

## 2023-03-20 HISTORY — DX: Encounter for other general counseling and advice on contraception: Z30.09

## 2023-03-20 MED ORDER — NIFEDIPINE ER 30 MG PO TB24: 30.0000 mg | ORAL_TABLET | Freq: Every day | ORAL | 2 refills | Status: DC

## 2023-03-20 MED ORDER — ASPIRIN 81 MG PO CHEW: 162.0000 mg | CHEWABLE_TABLET | Freq: Every day | ORAL | 2 refills | Status: DC

## 2023-03-20 NOTE — Patient Instructions (Signed)
Oral Glucose Tolerance Test During Pregnancy Why am I having this test? The oral glucose tolerance test (GTT) is done to check how your body processes blood sugar (glucose). This is one of several tests used to diagnose diabetes that develops during pregnancy (gestational diabetes mellitus). Gestational diabetes is a short-term form of diabetes that some women develop while they are pregnant. It usually occurs during the second or third trimester of pregnancy and goes away after delivery. Testing, or screening, for gestational diabetes usually occurs around 69 of pregnancy. This test may also be needed earlier if: You have a history of gestational diabetes. There is a history of giving birth to very large babies or of losing pregnancies (having stillbirths). You have signs and symptoms of diabetes, such as: Changes in your eyesight. Tingling or numbness in your hands or feet. Changes in hunger, thirst, and urination, and these are not explained by your pregnancy. What is being tested? This test measures the amount of glucose in your blood at different times during a period of 2 hours. This shows how well your body can process glucose.  You will have three separate blood draws. What kind of sample is taken?  Blood samples are required for this test. They are usually collected by inserting a needle into a blood vessel. How do I prepare for this test? For 3 days before your test, eat normally. Have plenty of carbohydrate-rich foods. You will be asked not to eat or drink anything other than water (to fast) starting 8-10 hours before the test. Tell a health care provider about: All medicines you are taking, including vitamins, herbs, eye drops, creams, and over-the-counter medicines. Any blood disorders you have. Any surgeries you have had. Any medical conditions you have. What happens during the test? First, your blood glucose will be measured. This is referred to as your fasting blood glucose  because you fasted before the test. Then, you will drink a glucose solution that contains a certain amount of glucose. Your blood glucose will be measured again 1 and 2 hours after you drink the solution. This test takes about 2 hours to complete. You will need to stay at the testing location during this time. During the testing period: Do not eat or drink anything other than the glucose solution. Do not exercise. Do not use any products that contain nicotine or tobacco, such as cigarettes, e-cigarettes, and chewing tobacco. These can affect your test results. If you need help quitting, ask your health care provider. The testing procedure may vary among health care providers and hospitals. How are the results reported? Your results will be reported as milligrams of glucose per deciliter of blood (mg/dL) or millimoles per liter (mmol/L). There is more than one source for screening and diagnosis reference values used to diagnose gestational diabetes. Your health care provider will compare your results to normal values that were established after testing a large group of people (reference values). Reference values may vary among labs and hospitals. For this test, reference values are: Fasting: 92 mg/dL 1 hour: 098 mg/dL  2 hour: 119 mg/dL   What do the results mean? Results below the reference values are considered normal. If one or more of your blood glucose levels are at or above the reference values, you will be diagnosed with gestational diabetes.  Talk with your health care provider about what your results mean. Questions to ask your health care provider Ask your health care provider, or the department that is doing the test: When  will my results be ready? How will I get my results? What are my treatment options? What other tests do I need? What are my next steps? Summary The oral glucose tolerance test (GTT) is one of several tests used to diagnose diabetes that develops during pregnancy  (gestational diabetes mellitus). Gestational diabetes is a short-term form of diabetes that some women develop while they are pregnant. You may also have this test if you have any symptoms or risk factors for this type of diabetes. Talk with your health care provider about what your results mean. This information is not intended to replace advice given to you by your health care provider. Make sure you discuss any questions you have with your health care provider.  RSV Vaccination for Pregnant People  CDC recommends two ways to protect babies from getting very sick with Respiratory Syncytial Virus (RSV):  An RSV vaccination given during pregnancy  Pfizer's vaccine Verdis Frederickson) is recommended for use during pregnancy. It is given during RSV season to people who are 32 through [redacted] weeks pregnant.  Or, An RSV immunization given directly to infants and some older babies  Babies born to mothers who get RSV vaccine at least 2 weeks before delivery will have protection and, in most cases, should not need an RSV immunization later.    When is RSV season?  In most regions of the [place] RSV season starts in the fall and peaks in the winter, but the timing and severity of RSV season can vary from place to place and year to year.   The goal of maternal RSV vaccination is to protect babies from getting very sick with RSV during their first RSV season.  In most of the [place], this means maternal RSV vaccine will be given in September through January.  Who should get the maternal RSV vaccine?  People who are 64 through [redacted] weeks pregnant during September through January should get one dose of maternal RSV vaccine to protect their babies. RSV season can vary around the country.   How is the maternal RSV vaccine administered?  Maternal RSV vaccine is given as a shot into the mother's upper arm. Only a single dose (one shot) of maternal RSV vaccine is recommended.   It is not yet  known whether another dose might be needed in later pregnancies.  How well does the maternal RSV vaccine work?  When someone gets RSV vaccine, their body responds by making a protein that protects against the virus that causes RSV. The process takes about 2 weeks. When a pregnant person gets RSV vaccine, their protective proteins (called antibodies) also pass to their baby. So, babies who are born at least 2 weeks after their mother gets RSV vaccine are protected at birth, when infants are at the highest risk of severe RSV disease.   The vaccine can reduce a baby's risk of being hospitalized from RSV by 57% in the first six months after birth.  What are the possible side effects of the maternal RSV vaccine?  In the clinical trials, the side effects most often reported by pregnant people who received the maternal RSV vaccine were pain at the injection site, headache, muscle pain, and nausea.  Although not common, a dangerous high blood pressure condition called pre-eclampsia occurred in 1.8% of pregnant people who received the maternal RSV vaccine compared to 1.4% of pregnant people who received a placebo.  The clinical trials identified a small increase in the number of preterm births in  vaccinated pregnant people. It is not clear if this is a true safety problem related to RSV vaccine or if this occurred for reasons unrelated to vaccination.  To reduce the potential risk of preterm birth and complications from RSV disease, FDA approved the maternal RSV vaccine for use during weeks 32 through 53 of pregnancy while additional studies are conducted.  FDA is requiring the manufacturer to do additional studies that will look more closely at the potential risk of preterm births and pregnancy-related high blood pressure issues in mothers, including pre-eclampsia.  Severe allergic reactions to vaccines are rare but can happen after any vaccine and can be life-threatening. If you see signs of a severe  allergic reaction after vaccination (hives, swelling of the face and throat, difficulty breathing, a fast heartbeat, dizziness, or weakness), seek immediate medical care by calling 911.  As with any medicine or vaccine there is a very remote chance of the vaccine causing other serious injury or death after vaccination.  Adverse events following vaccination should be reported to the Vaccine Adverse Event Reporting System (VAERS), even if it's not clear that the vaccine caused the adverse event. You or your doctor can report an adverse event to [facility] and FDA through VAERS. If you need further assistance reporting to VAERS, please email [email or web address] or call [phone].  If you have any questions about side effects from the maternal RSV vaccine, talk with your healthcare provider.  Do I need a prescription for a maternal RSV vaccine?  Until the vaccine available in the office, you will need a prescription to take to a local pharmacy that is providing the vaccine.   How do I pay for the maternal RSV vaccine?  Most private health insurance plans cover the maternal RSV vaccine, but there may be a cost to you depending on your plan.  Contact your insurer to find out.  Medicaid Beginning November 19, 2021, most people with coverage from [facility] and [facility]) will be guaranteed coverage of all vaccines recommended by the Advisory Committee on Immunization Practice at no cost to them.   Source: [facility] for Immunization and Respiratory Diseases

## 2023-03-20 NOTE — Progress Notes (Signed)
PRENATAL VISIT NOTE  Subjective:  Stephanieann Popescu is a 28 y.o. [id number] at [redacted]w[redacted]d being seen today for ongoing prenatal care.  She is currently monitored for the following issues for this high-risk pregnancy and has History of gestational diabetes mellitus (GDM); History of severe pre-eclampsia; Chronic hypertension; Depression; History of preterm labor, current pregnancy, third trimester; Supervision of high risk pregnancy, antepartum; Vaginal bleeding during pregnancy; History of incompetent cervix, currently pregnant in third trimester; Cervical cerclage suture present in third trimester; BMI 45.0-49.9, adult (HCC); Obesity in pregnancy; HSV (herpes simplex virus) infection; IUGR (intrauterine growth restriction) affecting care of mother, third trimester; and Unwanted fertility on their problem list.  Patient reports no complaints.  Contractions: Not present. Vag. Bleeding: None.  Movement: Present. Denies leaking of fluid.   The following portions of the patient's history were reviewed and updated as appropriate: allergies, current medications, past family history, past medical history, past social history, past surgical history and problem list.   Objective:   Vitals:   03/20/23 1547  BP: 135/84  Pulse: 96  Weight: 272 lb (123.4 kg)    Fetal Status: Fetal Heart Rate (bpm): 147   Movement: Present     General:  Alert, oriented and cooperative. Patient is in no acute distress.  Skin: Skin is warm and dry. No rash noted.   Cardiovascular: Normal heart rate noted  Respiratory: Normal respiratory effort, no problems with respiration noted  Abdomen: Soft, gravid, appropriate for gestational age.  Pain/Pressure: Absent     Pelvic: Cervical exam deferred        Extremities: Normal range of motion.  Edema: None  Mental Status: Normal mood and affect. Normal behavior. Normal judgment and thought content.   [place] MFM OB DETAIL +14 WK Result Date:  03/14/2023 ----------------------------------------------------------------------  OBSTETRICS REPORT                       (Signed Final 03/14/2023 12:38 pm) ---------------------------------------------------------------------- Patient Info  ID #:       [id number]                          D.O.B.:  [birth year] (27 yrs)(F)  Name:       [name]               Visit Date: 03/14/2023 08:20 am ---------------------------------------------------------------------- Performed By  Attending:        Braxton Feathers DO       Ref. Address:     [address]                                                             [place], [place]                                                             [address]  Performed By:     Dennis Bast RDMS      [facility]  Referred By:      [facility] ---------------------------------------------------------------------- Orders  #  Description                           Code        Ordered By  1  [place] MFM OB DETAIL +14 WK               76811.01    Marcheta Grammes  2  [place] MFM UA CORD DOPPLER                30865.78    Marcheta Grammes ----------------------------------------------------------------------  #  Order #                     Accession #                Episode #  1  [id number]                   [id number]                 [id number]  2  [id number]                   [id number]                 [id number] ---------------------------------------------------------------------- Indications  Hypertension - Chronic/Pre-existing            O10.019  Obesity complicating pregnancy, second         O99.212  trimester (BMI 51)  Poor obstetric history: Previous preterm       O09.219  delivery, antepartum ([redacted]w[redacted]d, [redacted]w[redacted]d, 32w)  Poor obstetric history:  Previous               O09.299  preeclampsia / eclampsia/gestational HTN  Poor obstetric history: Previous gestational   O09.299  diabetes  Cervical cerclage suture present (hx of        O34.32  cerclage), second trimester  Marginal insertion of umbilical cord affecting O43.192  management of mother in second trimester  History of cesarean delivery, currently        O31.219  pregnant X2  Encounter for antenatal screening for          Z36.3  malformations  [redacted] weeks gestation of pregnancy                Z3A.27  Maternal care for known or suspected poor      O36.5921  fetal growth, second trimester ---------------------------------------------------------------------- Vital Signs  Height:        5'2"  BP:          140/89 ---------------------------------------------------------------------- Fetal Evaluation  Num Of Fetuses:         1  Fetal Heart Rate(bpm):  129  Cardiac Activity:       Observed  Presentation:           Cephalic  Placenta:               Anterior  P. Cord Insertion:      Marginal insertion  Amniotic Fluid  AFI FV:      Within normal limits                              Largest Pocket(cm)                              4.19 ---------------------------------------------------------------------- Biometry  BPD:      66.7  mm     G. Age:  26w 6d         30  %    CI:        79.83   %    70 - 86                                                          FL/HC:      19.8   %    18.6 - 20.4  HC:      235.9  mm     G. Age:  25w 4d        1.5  %    HC/AC:      1.20        1.05 - 1.21  AC:      197.1  mm     G. Age:  24w 3d        < 1  %    FL/BPD:     69.9   %    71 - 87  FL:       46.6  mm     G. Age:  25w 4d        4.3  %    FL/AC:      23.6   %    20 - 24  HUM:      44.8  mm     G. Age:  26w 4d         33  %  Est. FW:     763  gm    1 lb 11 oz     1.1  % ---------------------------------------------------------------------- OB History  Blood Type:   A+  Gravidity:    6          Term:   2        Prem:   3  Living:       4 ---------------------------------------------------------------------- Gestational Age  U/S Today:     25w 4d                                        EDD:   06/23/23  Best:  27w 1d     Det. ByMarcella Dubs         EDD:   06/12/23                                      (11/14/22) ---------------------------------------------------------------------- Targeted Anatomy  Central Nervous System  Calvarium/Cranial V.:  Appears normal         Cereb./Vermis:          Not well visualized  Cavum:                 Not well visualized    Cisterna Magna:         Not well visualized  Lateral Ventricles:    Not well visualized    Midline Falx:           Not well visualized  Choroid Plexus:        Not well visualized  Spine  Cervical:              Not well visualized    Sacral:                 Not well visualized  Thoracic:              Not well visualized    Shape/Curvature:        Not well visualized  Lumbar:                Not well visualized  Head/Neck  Lips:                  Appears normal         Profile:                Appears normal  Neck:                  Appears normal         Orbits/Eyes:            Appears normal  Nuchal Fold:           Appears normal         Mandible:               Appears normal  Nasal Bone:            Present                Maxilla:                Appears normal  Thorax  4 Chamber View:        Appears normal         Interventr. Septum:     Appears normal  Cardiac Rhythm:        Normal                 Cardiac Axis:           Normal  Cardiac Situs:         Appears normal         Diaphragm:              Appears normal  Rt Outflow Tract:      Appears normal         3 Vessel View:          Appears normal  Lt Outflow Tract:      Appears normal  3 V Trachea View:       Appears normal  Aortic Arch:           Appears normal         IVC:                    Appears normal  Ductal Arch:           Appears normal         Crossing:                Appears normal  SVC:                   Appears normal  Abdomen  Ventral Wall:          Appears normal         Lt Kidney:              Appears normal  Cord Insertion:        Appears normal         Rt Kidney:              Appears normal  Situs:                 Appears normal         Bladder:                Appears normal  Stomach:               Appears normal  Extremities  Lt Humerus:            Appears normal         Lt Femur:               Appears normal  Rt Humerus:            Appears normal         Rt Femur:               Appears normal  Lt Forearm:            Appears normal         Lt Lower Leg:           Appears normal  Rt Forearm:            Appears normal         Rt Lower Leg:           Appears normal  Lt Hand:               Open hand nml          Lt Foot:                Nml heel/foot  Rt Hand:               Open hand nml          Rt Foot:                Nml heel/foot  Other  Umbilical Cord:        Normal 3-vessel        Genitalia:              Female-nml ---------------------------------------------------------------------- Doppler - Fetal Vessels  Umbilical Artery   S/D     %tile      RI    %tile  PSV    ADFV    RDFV                                                     (cm/s)   3.98       86    0.75       84                     39.93      No      No ---------------------------------------------------------------------- Cervix Uterus Adnexa  Cervix  Not visualized (advanced GA >24wks)  Uterus  No abnormality visualized.  Right Ovary  Not visualized.  Left Ovary  Not visualized.  Cul De Sac  No free fluid seen.  Adnexa  No abnormality visualized ---------------------------------------------------------------------- Comments  MFM CONSULT  Jodene Dowding is a 28 y.o. [id number] at [redacted]w[redacted]d here  for ultrasound and consultation.  Patient Active Problem List           Diagnosis         Date Noted          BMI 45.0-49.9, adult (HCC)          03/01/2023          Obesity in pregnancy       03/01/2023           HSV (herpes simplex virus) infection  infection01/11/2023          Vaginal bleeding during pregnancy   02/28/2023          History of incompetent cervix, currently pregnant in  second trimester  02/28/2023          Cervical cerclage suture present in second trimester  trimester01/10/2023          Supervision of high risk pregnancy, antepartum  antepartum        02/19/2023          History of preterm labor, current pregnancy, second  trimester          History of severe pre-eclampsia     08/17/2018          Chronic hypertension       08/17/2018          Depression        08/17/2018          History of gestational diabetes mellitus (GDM)  (GDM)    03/26/2018  Nitisha Johnston is doing well today with no acute  concerns. She denies contractions, bleeding, or loss of fluid  and reports good fetal movement.  The patient was receiving  prenatal care in [facility] and then moved to [place].  She was referred to [facility] prior to moving  due to a history of preterm birth as well as a previous  cerclage and a prior pregnancy.  She received a cerclage  with [facility] prior to moving to [place].  She has  now moved back and is living in [place] therefore she  transferred care here.  RE FGR: I discussed the diagnosis, management and  prognosis of fetal growth restriction (FGR) with the patient  today. I discussed the various causes of growth restriction  including constitutionally small fetus, placental insufficiency,  genetic problems and chronic maternal disease. Currently  there is no evidence of sonographic stigmata suggesting  infection or aneuploidy.  I discussed the most likely cause of  her fetal growth restriction is  placental insufficiency. We  discussed it is often challenging to differentiate between a  fetus that is constitutionally small but is fulfilling its growth  potential and a fetus that is not fulfilling its growth potential  because of an underlying pathologic condition.  Approximately  70% of fetuses with birth weight below the 10th percentile for  gestational age are constitutionally small; in the remaining  30%, the cause of the small size is pathologic, meeting  intrauterine growth restriction definition.  However, the  smaller the fetus in the earlier onset of growth restriction the  more likely there is a pathologic process causing the growth  restriction.  We also discussed the potential for genetic or  infectious causes which would only be diagnosed with  amniocentesis.  The patient is not interested in this at this  time given that there is no treatment for either condition.  I  also discussed the importance of antenatal fetal surveillance  including antenatal testing and umbilical artery Doppler  assessment to reduce the risk of stillbirth.  I discussed the  management going forward in the pregnancy with potential  alteration in the timing of delivery.  I also discussed the  increased risk of preeclampsia in addition to growth  restriction which are often coincident especially with early  onset growth restriction.Currently she feels well and denies  headache, vision changes, right upper quadrant pain,  contractions, vaginal bleeding or loss of fluid.  She reports  good fetal movement.  RE history of short cervix with cerclage: She has a history of  preterm delivery insetting of mid-trimester PPROM. During  her next delivery, she was undergoing cervical length  screening when found to have cervical shortening and  dilation where she received a cerclage in that pregnancy.  In  December 2024 during this pregnancy she had a cerclage  placed by physicians at [facility].  The patient was  receiving her care in [facility] before she moved to [place].  When she moved back she moved to [facility]  and therefore the that is why she did not go back to [facility].  Currently she is doing well and has no complaints.  Cerclage will be removed around 37 weeks or sooner if   indicated  RE vaginal bleeding with concern for marginal abruption: On  02/28/2023 the patient was admitted to [facility] for vaginal  bleeding with a probable marginal abruption.  She received  betamethasone and magnesium sulfate and had NICU  consultation as well.  She was discharged approximately 5  days later.  She has done well since that time and denies any  other bleeding episodes or cramping.  She knows to continue  to monitor her symptoms.  RE history of cesarean delivery x 2: Desires repeat  RE chronic hypertension with a history of preeclampsia:  Blood pressure is doing well today.  She denies any signs or  symptoms of preeclampsia.  Compliant with aspirin therapy.  Sonographic findings  Single intrauterine pregnancy.  Fetal cardiac activity:  Observed and appears normal.  Presentation: Cephalic.  Interval fetal anatomy appears normal but is very limited.  Fetal biometry shows the estimated fetal weight at the 1.1  percentile and the abdominal circumference at the 1  percentile.  Amniotic fluid: Within normal limits.  MVP: 4.19 cm.  Placenta: Anterior.  Umbilical artery dopplers findings:  -S/D:3.98 which are normal  at this gestational age.  -Absent end-diastolic flow: No.  -Reversed end-diastolic flow:  No.  NST was reassuring for gestational age.  There are limitations of prenatal ultrasound such as the  inability to detect certain abnormalities due to poor  visualization. Various factors such as fetal position,  gestational age and maternal body habitus may increase the  difficulty in visualizing the fetal anatomy.  Recommendations  -EDD is Estimated Date of Delivery: 06/11/23 based on an  early [place].  -Detailed ultrasound was done today without abnormalities  but is limited.  -Baseline preeclampsia labs previously completed.  -Needs GTT asap.  -Continue Aspirin 81 mg for preeclampsia prophylaxis  -Continue weekly UA dopplers until delivery.  -Continue to avoid tobacco use and exposure to harmful   substances  -Weekly antenatal testing (typically NST and then add BPP at  32 weeks )  -Twice weekly antenatal testing may be indicated if there is  very poor fetal growth, if the UA Dopplers have peroids of  absent end diastolic flow or if the antenatal testing is  equivocal.  -Betamethasone may be indicated if there is concern for  needing to deliver within 7 days.  At this time there is no  indication present.  She already received a course of  betamethasone on 02/28/2023 and therefore will need to be at  least 7 to 14 days past this point to receive another course.  -Serial growth [place] every 3 weeks until delivery  -Delivery timing will be pending clinical course but likely  around 37 weeks  -Cerclage to be removed at 36-37 weeks or sooner if  indicated  -With cases of early onset growth restriction with onset at  less than 32 weeks, amniocentesis to assess chromosomal  MicroArray and CMV is recommended.  The patient was  offered this and declined. ----------------------------------------------------------------------                  Braxton Feathers, DO Electronically Signed Final Report   03/14/2023 12:38 pm ----------------------------------------------------------------------   [place] MFM UA CORD DOPPLER Result Date: 03/14/2023 ----------------------------------------------------------------------  OBSTETRICS REPORT                       (Signed Final 03/14/2023 12:38 pm) ---------------------------------------------------------------------- Patient Info  ID #:       [id number]                          D.O.B.:  Aug 01, 1995 (27 yrs)(F)  Name:       [name]               Visit Date: 03/14/2023 08:20 am ---------------------------------------------------------------------- Performed By  Attending:        Braxton Feathers DO       Ref. Address:     [address]                                                             [place], [place]  [address]  Performed By:      Dennis Bast RDMS      [facility]  Referred By:      [facility] ---------------------------------------------------------------------- Orders  #  Description                           Code        Ordered By  1  [place] MFM OB DETAIL +14 WK               76811.01    Marcheta Grammes  2  [place] MFM UA CORD DOPPLER                11914.78    Marcheta Grammes ----------------------------------------------------------------------  #  Order #                     Accession #                Episode #  1  [id number]                   [id number]                 [id number]  2  [id number]                   [id number]                 [id number] ---------------------------------------------------------------------- Indications  Hypertension - Chronic/Pre-existing            O10.019  Obesity complicating pregnancy, second         O99.212  trimester (BMI 51)  Poor obstetric history: Previous preterm       O09.219  delivery, antepartum ([redacted]w[redacted]d, [redacted]w[redacted]d, 32w)  Poor obstetric history: Previous               O09.299  preeclampsia / eclampsia/gestational HTN  Poor obstetric history: Previous gestational   O09.299  diabetes  Cervical cerclage suture present (hx of        O34.32  cerclage), second trimester  Marginal insertion of umbilical cord affecting O43.192  management of mother in second trimester  History of cesarean  delivery, currently        O34.219  pregnant X2  Encounter for antenatal screening for          Z36.3  malformations  [redacted] weeks gestation of pregnancy                Z3A.27  Maternal care for known or suspected poor      O36.5921  fetal growth, second trimester ---------------------------------------------------------------------- Vital Signs                                                  Height:        5'2"  BP:          140/89 ---------------------------------------------------------------------- Fetal Evaluation  Num Of Fetuses:         1  Fetal Heart Rate(bpm):  129  Cardiac Activity:       Observed  Presentation:           Cephalic  Placenta:               Anterior  P. Cord Insertion:      Marginal insertion  Amniotic Fluid  AFI FV:      Within normal limits                              Largest Pocket(cm)                              4.19 ---------------------------------------------------------------------- Biometry  BPD:      66.7  mm     G. Age:  26w 6d         30  %    CI:        79.83   %    70 - 86                                                          FL/HC:      19.8   %    18.6 - 20.4  HC:      235.9  mm     G. Age:  25w 4d        1.5  %    HC/AC:      1.20        1.05 - 1.21  AC:      197.1  mm     G. Age:  24w 3d        < 1  %    FL/BPD:     69.9   %    71 - 87  FL:       46.6  mm     G. Age:  25w 4d        4.3  %    FL/AC:      23.6   %    20 - 24  HUM:      44.8  mm     G. Age:  26w 4d         33  %  Est. FW:  763  gm    1 lb 11 oz     1.1  % ---------------------------------------------------------------------- OB History  Blood Type:   A+  Gravidity:    6         Term:   2        Prem:   3  Living:       4 ---------------------------------------------------------------------- Gestational Age  U/S Today:     25w 4d                                        EDD:   06/23/23  Best:          27w 1d     Det. ByMarcella Dubs         EDD:   06/12/23                                      (11/14/22) ---------------------------------------------------------------------- Targeted Anatomy  Central Nervous System  Calvarium/Cranial V.:  Appears normal         Cereb./Vermis:          Not well visualized  Cavum:                 Not well visualized    Cisterna Magna:         Not well visualized  Lateral Ventricles:    Not well visualized    Midline Falx:           Not well  visualized  Choroid Plexus:        Not well visualized  Spine  Cervical:              Not well visualized    Sacral:                 Not well visualized  Thoracic:              Not well visualized    Shape/Curvature:        Not well visualized  Lumbar:                Not well visualized  Head/Neck  Lips:                  Appears normal         Profile:                Appears normal  Neck:                  Appears normal         Orbits/Eyes:            Appears normal  Nuchal Fold:           Appears normal         Mandible:               Appears normal  Nasal Bone:            Present                Maxilla:                Appears normal  Thorax  4 Chamber View:        Appears normal  Interventr. Septum:     Appears normal  Cardiac Rhythm:        Normal                 Cardiac Axis:           Normal  Cardiac Situs:         Appears normal         Diaphragm:              Appears normal  Rt Outflow Tract:      Appears normal         3 Vessel View:          Appears normal  Lt Outflow Tract:      Appears normal         3 V Trachea View:       Appears normal  Aortic Arch:           Appears normal         IVC:                    Appears normal  Ductal Arch:           Appears normal         Crossing:               Appears normal  SVC:                   Appears normal  Abdomen  Ventral Wall:          Appears normal         Lt Kidney:              Appears normal  Cord Insertion:        Appears normal         Rt Kidney:              Appears normal  Situs:                 Appears normal         Bladder:                Appears normal  Stomach:               Appears normal  Extremities  Lt Humerus:            Appears normal         Lt Femur:               Appears normal  Rt Humerus:            Appears normal         Rt Femur:               Appears normal  Lt Forearm:            Appears normal         Lt Lower Leg:           Appears normal  Rt Forearm:            Appears normal         Rt Lower Leg:           Appears normal   Lt Hand:               Open hand nml          Lt Foot:  Nml heel/foot  Rt Hand:               Open hand nml          Rt Foot:                Nml heel/foot  Other  Umbilical Cord:        Normal 3-vessel        Genitalia:              Female-nml ---------------------------------------------------------------------- Doppler - Fetal Vessels  Umbilical Artery   S/D     %tile      RI    %tile                      PSV    ADFV    RDFV                                                     (cm/s)   3.98       86    0.75       84                     39.93      No      No ---------------------------------------------------------------------- Cervix Uterus Adnexa  Cervix  Not visualized (advanced GA >24wks)  Uterus  No abnormality visualized.  Right Ovary  Not visualized.  Left Ovary  Not visualized.  Cul De Sac  No free fluid seen.  Adnexa  No abnormality visualized ---------------------------------------------------------------------- Comments  MFM CONSULT  Danice Graham is a 28 y.o. [id number] at [redacted]w[redacted]d here  for ultrasound and consultation.  Patient Active Problem List           Diagnosis         Date Noted          BMI 45.0-49.9, adult (HCC)          03/01/2023          Obesity in pregnancy       03/01/2023          HSV (herpes simplex virus) infection  infection01/11/2023          Vaginal bleeding during pregnancy   02/28/2023          History of incompetent cervix, currently pregnant in  second trimester  02/28/2023          Cervical cerclage suture present in second trimester  trimester01/10/2023          Supervision of high risk pregnancy, antepartum  antepartum        02/19/2023          History of preterm labor, current pregnancy, second  trimester          History of severe pre-eclampsia     08/17/2018          Chronic hypertension       08/17/2018          Depression        08/17/2018          History of gestational diabetes mellitus (GDM)  (GDM)    03/26/2018  Kumiko Crew is doing well today with no  acute  concerns. She denies contractions, bleeding, or loss of fluid  and reports good fetal movement.  The patient was receiving  prenatal care in [facility] and then moved to [place].  She was referred to [facility] prior to moving  due to a history of preterm birth as well as a previous  cerclage and a prior pregnancy.  She received a cerclage  with [facility] prior to moving to [place].  She has  now moved back and is living in [place] therefore she  transferred care here.  RE FGR: I discussed the diagnosis, management and  prognosis of fetal growth restriction (FGR) with the patient  today. I discussed the various causes of growth restriction  including constitutionally small fetus, placental insufficiency,  genetic problems and chronic maternal disease. Currently  there is no evidence of sonographic stigmata suggesting  infection or aneuploidy.  I discussed the most likely cause of  her fetal growth restriction is  placental insufficiency. We  discussed it is often challenging to differentiate between a  fetus that is constitutionally small but is fulfilling its growth  potential and a fetus that is not fulfilling its growth potential  because of an underlying pathologic condition. Approximately  70% of fetuses with birth weight below the 10th percentile for  gestational age are constitutionally small; in the remaining  30%, the cause of the small size is pathologic, meeting  intrauterine growth restriction definition.  However, the  smaller the fetus in the earlier onset of growth restriction the  more likely there is a pathologic process causing the growth  restriction.  We also discussed the potential for genetic or  infectious causes which would only be diagnosed with  amniocentesis.  The patient is not interested in this at this  time given that there is no treatment for either condition.  I  also discussed the importance of antenatal fetal surveillance  including antenatal testing and  umbilical artery Doppler  assessment to reduce the risk of stillbirth.  I discussed the  management going forward in the pregnancy with potential  alteration in the timing of delivery.  I also discussed the  increased risk of preeclampsia in addition to growth  restriction which are often coincident especially with early  onset growth restriction.Currently she feels well and denies  headache, vision changes, right upper quadrant pain,  contractions, vaginal bleeding or loss of fluid.  She reports  good fetal movement.  RE history of short cervix with cerclage: She has a history of  preterm delivery insetting of mid-trimester PPROM. During  her next delivery, she was undergoing cervical length  screening when found to have cervical shortening and  dilation where she received a cerclage in that pregnancy.  In  December 2024 during this pregnancy she had a cerclage  placed by physicians at [facility].  The patient was  receiving her care in [facility] before she moved to [place].  When she moved back she moved to [facility]  and therefore the that is why she did not go back to [facility].  Currently she is doing well and has no complaints.  Cerclage will be removed around 37 weeks or sooner if  indicated  RE vaginal bleeding with concern for marginal abruption: On  02/28/2023 the patient was admitted to [facility] for vaginal  bleeding with a probable marginal abruption.  She received  betamethasone and magnesium sulfate and had NICU  consultation as well.  She was discharged approximately 5  days later.  She has done well since that time and denies  any  other bleeding episodes or cramping.  She knows to continue  to monitor her symptoms.  RE history of cesarean delivery x 2: Desires repeat  RE chronic hypertension with a history of preeclampsia:  Blood pressure is doing well today.  She denies any signs or  symptoms of preeclampsia.  Compliant with aspirin therapy.  Sonographic findings  Single intrauterine  pregnancy.  Fetal cardiac activity:  Observed and appears normal.  Presentation: Cephalic.  Interval fetal anatomy appears normal but is very limited.  Fetal biometry shows the estimated fetal weight at the 1.1  percentile and the abdominal circumference at the 1  percentile.  Amniotic fluid: Within normal limits.  MVP: 4.19 cm.  Placenta: Anterior.  Umbilical artery dopplers findings:  -S/D:3.98 which are normal at this gestational age.  -Absent end-diastolic flow: No.  -Reversed end-diastolic flow:  No.  NST was reassuring for gestational age.  There are limitations of prenatal ultrasound such as the  inability to detect certain abnormalities due to poor  visualization. Various factors such as fetal position,  gestational age and maternal body habitus may increase the  difficulty in visualizing the fetal anatomy.  Recommendations  -EDD is Estimated Date of Delivery: 06/11/23 based on an  early [place].  -Detailed ultrasound was done today without abnormalities  but is limited.  -Baseline preeclampsia labs previously completed.  -Needs GTT asap.  -Continue Aspirin 81 mg for preeclampsia prophylaxis  -Continue weekly UA dopplers until delivery.  -Continue to avoid tobacco use and exposure to harmful  substances  -Weekly antenatal testing (typically NST and then add BPP at  32 weeks )  -Twice weekly antenatal testing may be indicated if there is  very poor fetal growth, if the UA Dopplers have peroids of  absent end diastolic flow or if the antenatal testing is  equivocal.  -Betamethasone may be indicated if there is concern for  needing to deliver within 7 days.  At this time there is no  indication present.  She already received a course of  betamethasone on 02/28/2023 and therefore will need to be at  least 7 to 14 days past this point to receive another course.  -Serial growth [place] every 3 weeks until delivery  -Delivery timing will be pending clinical course but likely  around 37 weeks  -Cerclage to be removed at 36-37  weeks or sooner if  indicated  -With cases of early onset growth restriction with onset at  less than 32 weeks, amniocentesis to assess chromosomal  MicroArray and CMV is recommended.  The patient was  offered this and declined. ----------------------------------------------------------------------                  Braxton Feathers, DO Electronically Signed Final Report   03/14/2023 12:38 pm ----------------------------------------------------------------------   [place] MFM OB COMP + 14 WK Result Date: 03/01/2023 ----------------------------------------------------------------------  OBSTETRICS REPORT                       (Signed Final 03/01/2023 11:42 am) ---------------------------------------------------------------------- Patient Info  ID #:       [id number]                          D.O.B.:  May 05, 1995 (27 yrs)(F)  Name:       [name]               Visit Date: 02/28/2023 10:36 pm ---------------------------------------------------------------------- Performed By  Attending:  Noralee Space MD        Referred By:      [facility] MAU/Triage  Performed By:     Hurman Horn          [facility] ---------------------------------------------------------------------- Orders  #  Description                           Code        Ordered By  1  [place] MFM OB COMP + 14 WK                76805.01    CHARLIE PICKENS  2  [place] MFM OB TRANSVAGINAL                [id number]     CHARLIE PICKENS ----------------------------------------------------------------------  #  Order #                     Accession #                Episode #  1  [id number]                   [id number]                 [id number]  2  [id number]                   [id number]                 [id number] ---------------------------------------------------------------------- Indications  Encounter for antenatal screening for          Z36.3  malformations  Vaginal bleeding in  pregnancy, second          O46.92  trimester  Poor obstetric history: Previous preterm       O09.219  delivery, antepartum ([redacted]w[redacted]d, [redacted]w[redacted]d, 32w)  Cervical cerclage suture present, second       O34.32  trimester  Obesity complicating pregnancy, second         O99.212  trimester  [redacted] weeks gestation of pregnancy                Z3A.25 ---------------------------------------------------------------------- Vital Signs  Weight (lb): 267                               Height:        5'2"  BMI:         48.83 ---------------------------------------------------------------------- Fetal Evaluation  Num Of Fetuses:         1  Fetal Heart Rate(bpm):  140  Cardiac Activity:       Observed  Presentation:           Cephalic  Placenta:               Anterior  P. Cord Insertion:      Visualized  Amniotic Fluid  AFI FV:      Within normal limits  Largest Pocket(cm)                              5.7  Comment:    No placental abruption or previa identified. ---------------------------------------------------------------------- Biometry  BPD:      60.6  mm     G. Age:  24w 5d         25  %    CI:        73.15   %    70 - 86                                                          FL/HC:      20.3   %    18.7 - 20.3  HC:      225.2  mm     G. Age:  24w 4d         12  %    HC/AC:      1.13        1.04 - 1.22  AC:      198.8  mm     G. Age:  24w 4d         23  %    FL/BPD:     75.6   %    71 - 87  FL:       45.8  mm     G. Age:  25w 1d         37  %    FL/AC:      23.0   %    20 - 24  CER:      28.9  mm     G. Age:  25w 3d         60  %  Est. FW:     734  gm    1 lb 10 oz      25  % ---------------------------------------------------------------------- OB History  Gravidity:    6         Term:   2        Prem:   3 ---------------------------------------------------------------------- Gestational Age  U/S Today:     24w 5d                                        EDD:   06/15/23  Best:          25w 1d     Det. ByMarcella Dubs         EDD:   06/12/23                                      (11/14/22) ---------------------------------------------------------------------- Anatomy  Cranium:               Appears normal         LVOT:                   Appears normal  Cavum:                 Appears  normal         Aortic Arch:            Appears normal  Ventricles:            Appears normal         Ductal Arch:            Not well visualized  Choroid Plexus:        Appears normal         Diaphragm:              Appears normal  Cerebellum:            Appears normal         Stomach:                Appears normal, left                                                                        sided  Posterior Fossa:       Appears normal         Abdomen:                Appears normal  Nuchal Fold:           Not applicable (>20    Abdominal Wall:         Appears nml (cord                         wks GA)                                        insert, abd wall)  Face:                  Orbits appear          Cord Vessels:           Appears normal (3                         normal                                         vessel cord)  Lips:                  Not well visualized    Kidneys:                Appear normal  Palate:                Not well visualized    Bladder:                Appears normal  Thoracic:              Appears normal         Spine:                  Not well visualized  Heart:  Appears normal         Upper Extremities:      Not well visualized                         (4CH, axis, and                         situs)  RVOT:                  Appears normal         Lower Extremities:      Not well visualized  Other:  Fetus appears to be a female. ---------------------------------------------------------------------- Cervix Uterus Adnexa  Cervix  Length:              4  cm.  Normal appearance by transvaginal scan Cerclage visualized.  Uterus  No abnormality visualized.  Right Ovary  Within normal limits.  Left Ovary  Not  visualized.  Adnexa  No abnormality visualized ---------------------------------------------------------------------- Impression  Patient presented to the MAU with complaints of vaginal  bleeding and abdominal pain.  She had prophylactic cerclage  in this pregnancy at [place].  Fetal biometry is consistent with her previously established  dates.  Amniotic fluid is normal good fetal activity seen.  Fetal  anatomical survey is limited but appears normal.  Placenta  appears normal with no evidence of retroplacental  hemorrhage.  We performed a transvaginal ultrasound to evaluate the  cervix.  The cervix measures 4 cm and the cerclage is in  place.  This study was remotely read . ----------------------------------------------------------------------                 Noralee Space, MD Electronically Signed Final Report   03/01/2023 11:42 am ----------------------------------------------------------------------   [place] MFM OB Transvaginal Result Date: 03/01/2023 ----------------------------------------------------------------------  OBSTETRICS REPORT                       (Signed Final 03/01/2023 11:42 am) ---------------------------------------------------------------------- Patient Info  ID #:       [id number]                          D.O.B.:  12-May-1995 (27 yrs)(F)  Name:       [name]               Visit Date: 02/28/2023 10:36 pm ---------------------------------------------------------------------- Performed By  Attending:        Noralee Space MD        Referred By:      [facility] MAU/Triage  Performed By:     Hurman Horn          [facility] ---------------------------------------------------------------------- Orders  #  Description                           Code        Ordered By  1  [place] MFM OB COMP + 14 WK  40981.19    CHARLIE PICKENS  2  [place] MFM OB TRANSVAGINAL                14782.9      CHARLIE PICKENS ----------------------------------------------------------------------  #  Order #                     Accession #                Episode #  1  [id number]                   [id number]                 [id number]  2  [id number]                   [id number]                 [id number] ---------------------------------------------------------------------- Indications  Encounter for antenatal screening for          Z36.3  malformations  Vaginal bleeding in pregnancy, second          O46.92  trimester  Poor obstetric history: Previous preterm       O09.219  delivery, antepartum ([redacted]w[redacted]d, [redacted]w[redacted]d, 32w)  Cervical cerclage suture present, second       O34.32  trimester  Obesity complicating pregnancy, second         O99.212  trimester  [redacted] weeks gestation of pregnancy                Z3A.25 ---------------------------------------------------------------------- Vital Signs  Weight (lb): 267                               Height:        5'2"  BMI:         48.83 ---------------------------------------------------------------------- Fetal Evaluation  Num Of Fetuses:         1  Fetal Heart Rate(bpm):  140  Cardiac Activity:       Observed  Presentation:           Cephalic  Placenta:               Anterior  P. Cord Insertion:      Visualized  Amniotic Fluid  AFI FV:      Within normal limits                              Largest Pocket(cm)                              5.7  Comment:    No placental abruption or previa identified. ---------------------------------------------------------------------- Biometry  BPD:      60.6  mm     G. Age:  24w 5d         25  %    CI:        73.15   %    70 - 86                                                          FL/HC:  20.3   %    18.7 - 20.3  HC:      225.2  mm     G. Age:  24w 4d         12  %    HC/AC:      1.13        1.04 - 1.22  AC:      198.8  mm     G. Age:  24w 4d         23  %    FL/BPD:     75.6   %    71 - 87  FL:       45.8  mm     G. Age:  25w 1d         37  %    FL/AC:       23.0   %    20 - 24  CER:      28.9  mm     G. Age:  25w 3d         60  %  Est. FW:     734  gm    1 lb 10 oz      25  % ---------------------------------------------------------------------- OB History  Gravidity:    6         Term:   2        Prem:   3 ---------------------------------------------------------------------- Gestational Age  U/S Today:     24w 5d                                        EDD:   06/15/23  Best:          25w 1d     Det. By:  Marcella Dubs         EDD:   06/12/23                                      (11/14/22) ---------------------------------------------------------------------- Anatomy  Cranium:               Appears normal         LVOT:                   Appears normal  Cavum:                 Appears normal         Aortic Arch:            Appears normal  Ventricles:            Appears normal         Ductal Arch:            Not well visualized  Choroid Plexus:        Appears normal         Diaphragm:              Appears normal  Cerebellum:            Appears normal         Stomach:                Appears normal, left  sided  Posterior Fossa:       Appears normal         Abdomen:                Appears normal  Nuchal Fold:           Not applicable (>20    Abdominal Wall:         Appears nml (cord                         wks GA)                                        insert, abd wall)  Face:                  Orbits appear          Cord Vessels:           Appears normal (3                         normal                                         vessel cord)  Lips:                  Not well visualized    Kidneys:                Appear normal  Palate:                Not well visualized    Bladder:                Appears normal  Thoracic:              Appears normal         Spine:                  Not well visualized  Heart:                 Appears normal         Upper Extremities:      Not well visualized                          (4CH, axis, and                         situs)  RVOT:                  Appears normal         Lower Extremities:      Not well visualized  Other:  Fetus appears to be a female. ---------------------------------------------------------------------- Cervix Uterus Adnexa  Cervix  Length:              4  cm.  Normal appearance by transvaginal scan Cerclage visualized.  Uterus  No abnormality visualized.  Right Ovary  Within normal limits.  Left Ovary  Not visualized.  Adnexa  No abnormality visualized ---------------------------------------------------------------------- Impression  Patient presented to the MAU with complaints of vaginal  bleeding and abdominal pain.  She had prophylactic cerclage  in  this pregnancy at [facility].  Fetal biometry is consistent with her previously established  dates.  Amniotic fluid is normal good fetal activity seen.  Fetal  anatomical survey is limited but appears normal.  Placenta  appears normal with no evidence of retroplacental  hemorrhage.  We performed a transvaginal ultrasound to evaluate the  cervix.  The cervix measures 4 cm and the cerclage is in  place.  This study was remotely read . ----------------------------------------------------------------------                 Noralee Space, MD Electronically Signed Final Report   03/01/2023 11:42 am ----------------------------------------------------------------------    Assessment and Plan:  Pregnancy: [id number] at [redacted]w[redacted]d 1. IUGR (intrauterine growth restriction) affecting care of mother, third trimester, fetus 1 EFW 1.1%, continue scans and testing as per MFM. Follow up MFM recommendations.  2. Chronic hypertension affecting pregnancy 3. History of severe pre-eclampsia Rising BPs, patient not taking Nifedipine.  This was re-ordered for patient, recommended ASA 162 mg daily.  - Comprehensive metabolic panel; Future - NIFEdipine (ADALAT CC) 30 MG 24 hr tablet; Take 1 tablet (30 mg total) by mouth  daily.  Dispense: 60 tablet; Refill: 2 - aspirin 81 MG chewable tablet; Chew 2 tablets (162 mg total) by mouth daily.  Dispense: 60 tablet; Refill: 2  4. History of incompetent cervix, currently pregnant in third trimester 5. Cervical cerclage suture present in third trimester 6. History of preterm labor, current pregnancy, third trimester PTL precautions advised  7. History of gestational diabetes mellitus (GDM) - Glucose Tolerance, 2 Hours w/1 Hour; Future to be done next visit  8. History of cesarean delivery x 2 9. Unwanted fertility Desires RCS +BTS, will follow up MFM recommendations regarding timing.  10. Need for diphtheria-tetanus-pertussis (Tdap) vaccine - Tdap vaccine greater than or equal to 7yo IM given today.  11. [redacted] weeks gestation of pregnancy 12. Supervision of high risk pregnancy, antepartum (Primary) - Glucose Tolerance, 2 Hours w/1 Hour; Future - CBC; Future - RPR; Future - HIV Antibody (routine testing w rflx); Future - Comprehensive metabolic panel; Future Preterm labor symptoms and general obstetric precautions including but not limited to vaginal bleeding, contractions, leaking of fluid and fetal movement were reviewed in detail with the patient. Please refer to After Visit Summary for other counseling recommendations.   Return in about 2 weeks (around 04/03/2023) for OFFICE OB VISIT (MD)  Needs lab appt for 2 hr GTT ASAP!!.  Future Appointments  Date Time Provider Department Center  03/28/2023  2:15 PM [facility] NURSE [facility]  03/28/2023  2:30 PM [facility] US6 [facility]  03/28/2023  3:15 PM [facility] NST [facility]  04/01/2023  1:00 PM [facility] NURSE [facility]  04/01/2023  1:15 PM [facility] NST [facility]  04/04/2023  8:15 AM Reva Bores, MD [facility]  04/04/2023  9:30 AM [facility] LAB [facility]  04/04/2023  2:15 PM [facility] NURSE [facility]  04/04/2023  2:30 PM [facility] US6 [facility]  04/04/2023  3:15 PM [facility] NST [facility]  04/12/2023 10:15 AM  Reva Bores, MD [facility]    Jaynie Collins, MD

## 2023-03-25 ENCOUNTER — Encounter: Payer: Self-pay | Admitting: *Deleted

## 2023-03-25 DIAGNOSIS — Z98891 History of uterine scar from previous surgery: Secondary | ICD-10-CM | POA: Insufficient documentation

## 2023-03-25 DIAGNOSIS — O0990 Supervision of high risk pregnancy, unspecified, unspecified trimester: Secondary | ICD-10-CM

## 2023-03-28 ENCOUNTER — Ambulatory Visit: Payer: MEDICAID | Admitting: *Deleted

## 2023-03-28 ENCOUNTER — Ambulatory Visit (HOSPITAL_BASED_OUTPATIENT_CLINIC_OR_DEPARTMENT_OTHER): Payer: MEDICAID | Admitting: *Deleted

## 2023-03-28 ENCOUNTER — Ambulatory Visit: Payer: MEDICAID | Attending: Maternal & Fetal Medicine

## 2023-03-28 ENCOUNTER — Encounter: Payer: MEDICAID | Admitting: Physician Assistant

## 2023-03-28 ENCOUNTER — Other Ambulatory Visit: Payer: Self-pay

## 2023-03-28 VITALS — BP 114/81

## 2023-03-28 DIAGNOSIS — O10013 Pre-existing essential hypertension complicating pregnancy, third trimester: Secondary | ICD-10-CM

## 2023-03-28 DIAGNOSIS — Z98891 History of uterine scar from previous surgery: Secondary | ICD-10-CM

## 2023-03-28 DIAGNOSIS — O09293 Supervision of pregnancy with other poor reproductive or obstetric history, third trimester: Secondary | ICD-10-CM

## 2023-03-28 DIAGNOSIS — O0990 Supervision of high risk pregnancy, unspecified, unspecified trimester: Secondary | ICD-10-CM

## 2023-03-28 DIAGNOSIS — O99213 Obesity complicating pregnancy, third trimester: Secondary | ICD-10-CM | POA: Diagnosis not present

## 2023-03-28 DIAGNOSIS — O365990 Maternal care for other known or suspected poor fetal growth, unspecified trimester, not applicable or unspecified: Secondary | ICD-10-CM | POA: Diagnosis present

## 2023-03-28 DIAGNOSIS — E669 Obesity, unspecified: Secondary | ICD-10-CM | POA: Diagnosis not present

## 2023-03-28 DIAGNOSIS — O99212 Obesity complicating pregnancy, second trimester: Secondary | ICD-10-CM | POA: Diagnosis present

## 2023-03-28 DIAGNOSIS — O43193 Other malformation of placenta, third trimester: Secondary | ICD-10-CM

## 2023-03-28 DIAGNOSIS — O34219 Maternal care for unspecified type scar from previous cesarean delivery: Secondary | ICD-10-CM

## 2023-03-28 DIAGNOSIS — O365930 Maternal care for other known or suspected poor fetal growth, third trimester, not applicable or unspecified: Secondary | ICD-10-CM | POA: Diagnosis present

## 2023-03-28 DIAGNOSIS — O09213 Supervision of pregnancy with history of pre-term labor, third trimester: Secondary | ICD-10-CM

## 2023-03-28 DIAGNOSIS — Z3A29 29 weeks gestation of pregnancy: Secondary | ICD-10-CM | POA: Insufficient documentation

## 2023-03-28 DIAGNOSIS — O3433 Maternal care for cervical incompetence, third trimester: Secondary | ICD-10-CM

## 2023-03-28 NOTE — Procedures (Signed)
 Margaret Ingram 10/05/1995 [redacted]w[redacted]d  Fetus A Non-Stress Test Interpretation for 03/28/23   NST only  Indication: IUGR  Fetal Heart Rate A Mode: External Baseline Rate (A): 130 bpm Variability: Moderate, Minimal Accelerations: 10 x 10 Decelerations: None Multiple birth?: No  Uterine Activity Mode: Palpation, Toco Contraction Frequency (min): none Resting Tone Palpated: Relaxed  Interpretation (Fetal Testing) Nonstress Test Interpretation: Reactive Overall Impression: Reassuring for gestational age Comments: Dr. Ileana reviewed tracing

## 2023-03-30 ENCOUNTER — Encounter: Payer: Self-pay | Admitting: Obstetrics & Gynecology

## 2023-04-01 ENCOUNTER — Ambulatory Visit: Payer: MEDICAID | Attending: Maternal & Fetal Medicine | Admitting: *Deleted

## 2023-04-01 ENCOUNTER — Ambulatory Visit: Payer: MEDICAID | Admitting: *Deleted

## 2023-04-01 ENCOUNTER — Other Ambulatory Visit: Payer: Self-pay

## 2023-04-01 ENCOUNTER — Ambulatory Visit: Payer: MEDICAID

## 2023-04-01 ENCOUNTER — Telehealth: Payer: Self-pay | Admitting: *Deleted

## 2023-04-01 VITALS — BP 132/63 | HR 75

## 2023-04-01 DIAGNOSIS — O219 Vomiting of pregnancy, unspecified: Secondary | ICD-10-CM

## 2023-04-01 DIAGNOSIS — R8290 Unspecified abnormal findings in urine: Secondary | ICD-10-CM

## 2023-04-01 DIAGNOSIS — O0990 Supervision of high risk pregnancy, unspecified, unspecified trimester: Secondary | ICD-10-CM

## 2023-04-01 DIAGNOSIS — O365930 Maternal care for other known or suspected poor fetal growth, third trimester, not applicable or unspecified: Secondary | ICD-10-CM | POA: Insufficient documentation

## 2023-04-01 DIAGNOSIS — R109 Unspecified abdominal pain: Secondary | ICD-10-CM

## 2023-04-01 DIAGNOSIS — Z3A29 29 weeks gestation of pregnancy: Secondary | ICD-10-CM | POA: Diagnosis not present

## 2023-04-01 DIAGNOSIS — Z98891 History of uterine scar from previous surgery: Secondary | ICD-10-CM

## 2023-04-01 LAB — POCT URINALYSIS DIP (DEVICE)
Glucose, UA: NEGATIVE mg/dL
Hgb urine dipstick: NEGATIVE
Leukocytes,Ua: NEGATIVE
Nitrite: NEGATIVE
Protein, ur: NEGATIVE mg/dL
Specific Gravity, Urine: >=1.03 (ref 1.005–1.030)
Urobilinogen, UA: 4 mg/dL — ABNORMAL HIGH (ref 0.0–1.0)
pH: 6 (ref 5.0–8.0)

## 2023-04-01 MED ORDER — PROMETHAZINE HCL 25 MG PO TABS: 25.0000 mg | ORAL_TABLET | Freq: Four times a day (QID) | ORAL | 1 refills | Status: DC | PRN

## 2023-04-01 NOTE — Procedures (Signed)
 Margaret Ingram Jul 17, 1995 [redacted]w[redacted]d  Fetus A Non-Stress Test Interpretation for 04/01/23  NST only  Indication: IUGR  Fetal Heart Rate A Mode: External Baseline Rate (A): 135 bpm Variability: Moderate Accelerations: 10 x 10 Decelerations: None Multiple birth?: No  Uterine Activity Mode: Palpation, Toco Contraction Frequency (min): none Resting Tone Palpated: Relaxed  Interpretation (Fetal Testing) Nonstress Test Interpretation: Reactive Overall Impression: Reassuring for gestational age Comments: Dr. Arnie Bibber reviewed tracing

## 2023-04-01 NOTE — Telephone Encounter (Signed)
 Pt left a MyChart message over weekend while office closed stating she is having a lot of contractions. Per chart review she has not been to the hospital for evaluation over the weekend. She is [redacted]w[redacted]d. I called and she reports she was having a lot of contractions all day Saturday but today is only having a few little cramps. States having some back muscle spasms and feels pressure when she pees but denies pain or burning when she pees. She has a MFM appointment for NST today at 1pm and we discussed she is planning to keep that appointment. I explained that will check for how many contractions she is having. I advised her dropping off a urine in our office so we can check for UTI. I also advised if she frequent contractions again or increased pressure she should go to [facility] for evaluation . She voices understanding.  Margaret Ingram

## 2023-04-01 NOTE — Progress Notes (Signed)
Pt spoke with Bonita Quin, RN this morning regarding abdominal cramping, back muscle spasms, and feeling pressure with urination. Denied any burning or pain with urination. Plan was made for patient to leave urine specimen for UA while in office for MFM appt. Clean catch urine collected and urinalysis results included below:    Latest Reference Range & Units 04/01/23 13:50  Bilirubin Urine NEGATIVE  SMALL !  Glucose, UA NEGATIVE mg/dL NEGATIVE  Hgb urine dipstick NEGATIVE  NEGATIVE  Ketones, ur NEGATIVE mg/dL TRACE !  Leukocytes,Ua NEGATIVE  NEGATIVE  Nitrite NEGATIVE  NEGATIVE  pH 5.0 - 8.0  6.0  Protein NEGATIVE mg/dL NEGATIVE  Specific Gravity, Urine 1.005 - 1.030  >=1.030  Urobilinogen, UA 0.0 - 1.0 mg/dL 4.0 (H)   Reviewed with Cresenzo, MD who recommends urine culture be collected. No treatment recommended based on UA results today. Called pt to review. Pt reports she has been nauseas for entire pregnancy which is making it hard to eat and drink regularly. States nausea was improved during 2nd trimester but has started to worsen again. Denies symptoms of heartburn. Zofran taken earlier in pregnancy was not effective. Offered Phenergan per protocol which patient accepts. Patient describes back spasm from upper to lower back; this was addressed during hospital admission in January and patient was given Flexeril. Has not taken Flexeril since worsening back spasms over the weekend. Recommended pt trial this next time she has back spasm. Reviewed return precautions for MAU.  Fleet Contras RN 04/01/23

## 2023-04-02 ENCOUNTER — Encounter: Payer: Self-pay | Admitting: Obstetrics & Gynecology

## 2023-04-03 LAB — CULTURE, OB URINE

## 2023-04-03 LAB — URINE CULTURE, OB REFLEX

## 2023-04-04 ENCOUNTER — Encounter: Payer: Self-pay | Admitting: Family Medicine

## 2023-04-04 ENCOUNTER — Ambulatory Visit: Payer: MEDICAID | Admitting: *Deleted

## 2023-04-04 ENCOUNTER — Ambulatory Visit: Payer: MEDICAID

## 2023-04-04 ENCOUNTER — Ambulatory Visit (HOSPITAL_BASED_OUTPATIENT_CLINIC_OR_DEPARTMENT_OTHER): Payer: MEDICAID | Admitting: *Deleted

## 2023-04-04 ENCOUNTER — Other Ambulatory Visit: Payer: Self-pay

## 2023-04-04 ENCOUNTER — Other Ambulatory Visit: Payer: MEDICAID

## 2023-04-04 ENCOUNTER — Other Ambulatory Visit (HOSPITAL_COMMUNITY)
Admission: RE | Admit: 2023-04-04 | Discharge: 2023-04-04 | Disposition: A | Discharge status: Home / Self Care | Payer: MEDICAID | Source: Ambulatory Visit | Attending: Family Medicine | Admitting: Family Medicine

## 2023-04-04 ENCOUNTER — Other Ambulatory Visit: Payer: Self-pay | Admitting: *Deleted

## 2023-04-04 ENCOUNTER — Ambulatory Visit: Payer: MEDICAID | Admitting: Family Medicine

## 2023-04-04 ENCOUNTER — Other Ambulatory Visit: Payer: Self-pay | Admitting: Maternal & Fetal Medicine

## 2023-04-04 VITALS — BP 134/63 | HR 80

## 2023-04-04 VITALS — BP 135/76 | HR 92 | Wt 272.8 lb

## 2023-04-04 DIAGNOSIS — O26893 Other specified pregnancy related conditions, third trimester: Secondary | ICD-10-CM

## 2023-04-04 DIAGNOSIS — O99213 Obesity complicating pregnancy, third trimester: Secondary | ICD-10-CM

## 2023-04-04 DIAGNOSIS — O10919 Unspecified pre-existing hypertension complicating pregnancy, unspecified trimester: Secondary | ICD-10-CM

## 2023-04-04 DIAGNOSIS — Z3A30 30 weeks gestation of pregnancy: Secondary | ICD-10-CM | POA: Insufficient documentation

## 2023-04-04 DIAGNOSIS — O43193 Other malformation of placenta, third trimester: Secondary | ICD-10-CM

## 2023-04-04 DIAGNOSIS — Z98891 History of uterine scar from previous surgery: Secondary | ICD-10-CM

## 2023-04-04 DIAGNOSIS — R102 Pelvic and perineal pain: Secondary | ICD-10-CM | POA: Insufficient documentation

## 2023-04-04 DIAGNOSIS — Z113 Encounter for screening for infections with a predominantly sexual mode of transmission: Secondary | ICD-10-CM | POA: Insufficient documentation

## 2023-04-04 DIAGNOSIS — O10913 Unspecified pre-existing hypertension complicating pregnancy, third trimester: Secondary | ICD-10-CM

## 2023-04-04 DIAGNOSIS — O365930 Maternal care for other known or suspected poor fetal growth, third trimester, not applicable or unspecified: Secondary | ICD-10-CM | POA: Insufficient documentation

## 2023-04-04 DIAGNOSIS — O0993 Supervision of high risk pregnancy, unspecified, third trimester: Secondary | ICD-10-CM | POA: Diagnosis not present

## 2023-04-04 DIAGNOSIS — O09213 Supervision of pregnancy with history of pre-term labor, third trimester: Secondary | ICD-10-CM

## 2023-04-04 DIAGNOSIS — O365990 Maternal care for other known or suspected poor fetal growth, unspecified trimester, not applicable or unspecified: Secondary | ICD-10-CM | POA: Insufficient documentation

## 2023-04-04 DIAGNOSIS — O365931 Maternal care for other known or suspected poor fetal growth, third trimester, fetus 1: Secondary | ICD-10-CM

## 2023-04-04 DIAGNOSIS — O99212 Obesity complicating pregnancy, second trimester: Secondary | ICD-10-CM | POA: Insufficient documentation

## 2023-04-04 DIAGNOSIS — E669 Obesity, unspecified: Secondary | ICD-10-CM

## 2023-04-04 DIAGNOSIS — O0990 Supervision of high risk pregnancy, unspecified, unspecified trimester: Secondary | ICD-10-CM

## 2023-04-04 DIAGNOSIS — R519 Headache, unspecified: Secondary | ICD-10-CM

## 2023-04-04 DIAGNOSIS — B009 Herpesviral infection, unspecified: Secondary | ICD-10-CM

## 2023-04-04 DIAGNOSIS — O3433 Maternal care for cervical incompetence, third trimester: Secondary | ICD-10-CM

## 2023-04-04 DIAGNOSIS — O26899 Other specified pregnancy related conditions, unspecified trimester: Secondary | ICD-10-CM

## 2023-04-04 DIAGNOSIS — O34219 Maternal care for unspecified type scar from previous cesarean delivery: Secondary | ICD-10-CM

## 2023-04-04 DIAGNOSIS — Z8632 Personal history of gestational diabetes: Secondary | ICD-10-CM

## 2023-04-04 DIAGNOSIS — Z3A28 28 weeks gestation of pregnancy: Secondary | ICD-10-CM

## 2023-04-04 DIAGNOSIS — Z3009 Encounter for other general counseling and advice on contraception: Secondary | ICD-10-CM

## 2023-04-04 DIAGNOSIS — O10013 Pre-existing essential hypertension complicating pregnancy, third trimester: Secondary | ICD-10-CM | POA: Diagnosis not present

## 2023-04-04 DIAGNOSIS — O09293 Supervision of pregnancy with other poor reproductive or obstetric history, third trimester: Secondary | ICD-10-CM

## 2023-04-04 DIAGNOSIS — Z8759 Personal history of other complications of pregnancy, childbirth and the puerperium: Secondary | ICD-10-CM

## 2023-04-04 MED ORDER — ONDANSETRON 4 MG PO TBDP
4.0000 mg | ORAL_TABLET | Freq: Three times a day (TID) | ORAL | 0 refills | Status: AC | PRN
Start: 2023-04-04 — End: ?

## 2023-04-04 MED ORDER — BUTALBITAL-APAP-CAFFEINE 50-325-40 MG PO TABS: 1.0000 | ORAL_TABLET | Freq: Four times a day (QID) | ORAL | 0 refills | Status: DC | PRN

## 2023-04-04 NOTE — Addendum Note (Signed)
Addended byVidal Schwalbe on: 04/04/2023 09:51 AM   Modules accepted: Orders

## 2023-04-04 NOTE — Progress Notes (Signed)
PRENATAL VISIT NOTE  Subjective:  Margaret Ingram is a 28 y.o. [id number] at [redacted]w[redacted]d being seen today for ongoing prenatal care.  She is currently monitored for the following issues for this high-risk pregnancy and has History of gestational diabetes mellitus (GDM); History of severe pre-eclampsia; Chronic hypertension; Depression; History of preterm labor, current pregnancy, third trimester; Chronic hypertension in pregnancy; Supervision of high risk pregnancy, antepartum; Vaginal bleeding during pregnancy; History of incompetent cervix, currently pregnant in third trimester; Cervical cerclage suture present in third trimester; BMI 45.0-49.9, adult (HCC); Obesity in pregnancy; HSV (herpes simplex virus) infection; IUGR (intrauterine growth restriction) affecting care of mother, third trimester; Unwanted fertility; and History of C-section on their problem list.  Patient reports headache.  Contractions: Irregular. Vag. Bleeding: None.  Movement: Present. Denies leaking of fluid.   The following portions of the patient's history were reviewed and updated as appropriate: allergies, current medications, past family history, past medical history, past social history, past surgical history and problem list.   Objective:   Vitals:   04/04/23 0821 04/04/23 0912  BP: (!) 143/78 135/76  Pulse: 92   Weight: 272 lb 12.8 oz (123.7 kg)     Fetal Status: Fetal Heart Rate (bpm): 150 Fundal Height: 31 cm Movement: Present     General:  Alert, oriented and cooperative. Patient is in no acute distress.  Skin: Skin is warm and dry. No rash noted.   Cardiovascular: Normal heart rate noted  Respiratory: Normal respiratory effort, no problems with respiration noted  Abdomen: Soft, gravid, appropriate for gestational age.  Pain/Pressure: Present     Pelvic: Cervical exam performed in the presence of a chaperone Dilation: Closed Effacement (%): Thick Station: -2 (LUSD, with head in front of cervix)  Extremities:  Normal range of motion.  Edema: Mild pitting, slight indentation  Mental Status: Normal mood and affect. Normal behavior. Normal judgment and thought content.   Assessment and Plan:  Pregnancy: [id number] at [redacted]w[redacted]d 1. [redacted] weeks gestation of pregnancy (Primary)   2. Cervical cerclage suture present in third trimester With cerclage in place. Cx feels unlabored  3. Unwanted fertility BTL papers signed today  4. Supervision of high risk pregnancy, antepartum   5. IUGR (intrauterine growth restriction) affecting care of mother, third trimester EFW at 1% Weekly dopplers and repeat growth today  6. HSV (herpes simplex virus) infection ON Valtrex  7. History of severe pre-eclampsia With slight elevation today and headache Warning signs reviewed Labs today - Comp Met (CMET) - Protein / creatinine ratio, urine  8. History of preterm labor, current pregnancy, third trimester   9. History of C-section X 2, desires RCS and BTL, though would be a good VBAC candidate, given SVD x 3  10. Chronic hypertension in pregnancy On Procardia and ASA - Comp Met (CMET) - Protein / creatinine ratio, urine  11. Pelvic pain affecting pregnancy, antepartum Check cultures today though suspect pain is from low fetal station - Cervicovaginal ancillary only( [place])  12. Pregnancy headache in third trimester Refilled Fioricet Flexeril not helping Warning signs reviewed - Comp Met (CMET) - Protein / creatinine ratio, urine  Preterm labor symptoms and general obstetric precautions including but not limited to vaginal bleeding, contractions, leaking of fluid and fetal movement were reviewed in detail with the patient. Please refer to After Visit Summary for other counseling recommendations.   Return in 2 weeks (on 04/18/2023).  Future Appointments  Date Time Provider Department Center  04/04/2023  9:30 AM [facility] LAB [facility]  [facility]  04/04/2023  2:15 PM [facility] NURSE [facility]  04/04/2023  2:30  PM [facility] US6 [facility]  04/04/2023  3:15 PM [facility] NST [facility]  04/12/2023 10:15 AM Reva Bores, MD [facility]    Reva Bores, MD

## 2023-04-04 NOTE — Procedures (Signed)
Deonna Zink 03-Jan-1996 [redacted]w[redacted]d  Fetus A Non-Stress Test Interpretation for 04/04/23  Indication: IUGR, Chronic Hypertenstion, and obese class 3  Fetal Heart Rate A Mode: External Baseline Rate (A): 135 bpm Variability: Moderate Accelerations: 10 x 10 Decelerations: None Multiple birth?: No  Uterine Activity Mode: Toco Contraction Frequency (min): none Resting Tone Palpated: Relaxed  Interpretation (Fetal Testing) Nonstress Test Interpretation: Reactive Comments: Tracing reviewed by Dr. Parke Poisson

## 2023-04-04 NOTE — Addendum Note (Signed)
Addended by: Reva Bores on: 04/04/2023 09:34 AM   Modules accepted: Orders

## 2023-04-05 ENCOUNTER — Encounter: Payer: Self-pay | Admitting: Family Medicine

## 2023-04-05 LAB — CBC
Hematocrit: 34.7 % (ref 34.0–46.6)
Hemoglobin: 10.9 g/dL — ABNORMAL LOW (ref 11.1–15.9)
MCH: 27.7 pg (ref 26.6–33.0)
MCHC: 31.4 g/dL — ABNORMAL LOW (ref 31.5–35.7)
MCV: 88 fL (ref 79–97)
Platelets: 189 10*3/uL (ref 150–450)
RBC: 3.94 x10E6/uL (ref 3.77–5.28)
RDW: 11.3 % — ABNORMAL LOW (ref 11.7–15.4)
WBC: 5.3 10*3/uL (ref 3.4–10.8)

## 2023-04-05 LAB — CERVICOVAGINAL ANCILLARY ONLY
Bacterial Vaginitis (gardnerella): NEGATIVE
Candida Glabrata: NEGATIVE
Candida Vaginitis: NEGATIVE
Chlamydia: NEGATIVE
Comment: NEGATIVE
Comment: NEGATIVE
Comment: NEGATIVE
Comment: NEGATIVE
Comment: NEGATIVE
Comment: NORMAL
Neisseria Gonorrhea: NEGATIVE
Trichomonas: NEGATIVE

## 2023-04-05 LAB — COMPREHENSIVE METABOLIC PANEL
ALT: 20 [IU]/L (ref 0–32)
AST: 9 [IU]/L (ref 0–40)
Albumin: 3.7 g/dL — ABNORMAL LOW (ref 4.0–5.0)
Alkaline Phosphatase: 76 [IU]/L (ref 44–121)
BUN/Creatinine Ratio: 13 (ref 9–23)
BUN: 7 mg/dL (ref 6–20)
Bilirubin Total: 0.3 mg/dL (ref 0.0–1.2)
CO2: 20 mmol/L (ref 20–29)
Calcium: 9.2 mg/dL (ref 8.7–10.2)
Chloride: 103 mmol/L (ref 96–106)
Creatinine, Ser: 0.52 mg/dL — ABNORMAL LOW (ref 0.57–1.00)
Globulin, Total: 3 g/dL (ref 1.5–4.5)
Glucose: 80 mg/dL (ref 70–99)
Potassium: 3.9 mmol/L (ref 3.5–5.2)
Sodium: 138 mmol/L (ref 134–144)
Total Protein: 6.7 g/dL (ref 6.0–8.5)
eGFR: 131 mL/min/{1.73_m2} (ref >59)

## 2023-04-05 LAB — RPR: RPR Ser Ql: NONREACTIVE

## 2023-04-05 LAB — HIV ANTIBODY (ROUTINE TESTING W REFLEX): HIV Screen 4th Generation wRfx: NONREACTIVE

## 2023-04-06 LAB — PROTEIN / CREATININE RATIO, URINE
Creatinine, Urine: 163.1 mg/dL
Protein, Ur: 20.5 mg/dL
Protein/Creat Ratio: 126 mg/g{creat} (ref 0–200)

## 2023-04-09 ENCOUNTER — Encounter: Payer: Self-pay | Admitting: *Deleted

## 2023-04-12 ENCOUNTER — Other Ambulatory Visit: Payer: MEDICAID

## 2023-04-12 ENCOUNTER — Ambulatory Visit: Payer: MEDICAID

## 2023-04-12 ENCOUNTER — Ambulatory Visit: Payer: MEDICAID | Admitting: Family Medicine

## 2023-04-12 VITALS — BP 138/87 | HR 78 | Wt 271.0 lb

## 2023-04-12 DIAGNOSIS — O365931 Maternal care for other known or suspected poor fetal growth, third trimester, fetus 1: Secondary | ICD-10-CM | POA: Diagnosis not present

## 2023-04-12 DIAGNOSIS — Z8632 Personal history of gestational diabetes: Secondary | ICD-10-CM

## 2023-04-12 DIAGNOSIS — Z3A31 31 weeks gestation of pregnancy: Secondary | ICD-10-CM | POA: Diagnosis not present

## 2023-04-12 DIAGNOSIS — O10919 Unspecified pre-existing hypertension complicating pregnancy, unspecified trimester: Secondary | ICD-10-CM

## 2023-04-12 DIAGNOSIS — O10913 Unspecified pre-existing hypertension complicating pregnancy, third trimester: Secondary | ICD-10-CM

## 2023-04-12 DIAGNOSIS — O0990 Supervision of high risk pregnancy, unspecified, unspecified trimester: Secondary | ICD-10-CM

## 2023-04-12 DIAGNOSIS — O09293 Supervision of pregnancy with other poor reproductive or obstetric history, third trimester: Secondary | ICD-10-CM

## 2023-04-12 DIAGNOSIS — O0993 Supervision of high risk pregnancy, unspecified, third trimester: Secondary | ICD-10-CM

## 2023-04-12 DIAGNOSIS — Z98891 History of uterine scar from previous surgery: Secondary | ICD-10-CM

## 2023-04-12 NOTE — Progress Notes (Signed)
   PRENATAL VISIT NOTE  Subjective:  Margaret Ingram is a 28 y.o. [id number] at [redacted]w[redacted]d being seen today for ongoing prenatal care.  She is currently monitored for the following issues for this high-risk pregnancy and has History of gestational diabetes mellitus (GDM); History of severe pre-eclampsia; Chronic hypertension; Depression; History of preterm labor, current pregnancy, third trimester; Chronic hypertension in pregnancy; Supervision of high risk pregnancy, antepartum; Vaginal bleeding during pregnancy; History of incompetent cervix, currently pregnant in third trimester; Cervical cerclage suture present in third trimester; BMI 45.0-49.9, adult (HCC); Obesity in pregnancy; HSV (herpes simplex virus) infection; IUGR (intrauterine growth restriction) affecting care of mother, third trimester; Unwanted fertility; and History of C-section on their problem list.  Patient reports fatigue and generally not feeling well.  Contractions: Not present. Vag. Bleeding: None.  Movement: Present. Denies leaking of fluid.   The following portions of the patient's history were reviewed and updated as appropriate: allergies, current medications, past family history, past medical history, past social history, past surgical history and problem list.   Objective:   Vitals:   04/12/23 1044  BP: 138/87  Pulse: 78  Weight: 271 lb (122.9 kg)    Fetal Status: Fetal Heart Rate (bpm): 143 Fundal Height: 32 cm Movement: Present     General:  Alert, oriented and cooperative. Patient is in no acute distress.  Skin: Skin is warm and dry. No rash noted.   Cardiovascular: Normal heart rate noted  Respiratory: Normal respiratory effort, no problems with respiration noted  Abdomen: Soft, gravid, appropriate for gestational age.  Pain/Pressure: Absent     Pelvic: Cervical exam deferred        Extremities: Normal range of motion.  Edema: None  Mental Status: Normal mood and affect. Normal behavior. Normal judgment and  thought content.   Assessment and Plan:  Pregnancy: [id number] at [redacted]w[redacted]d 1. Supervision of high risk pregnancy, antepartum (Primary) Continue prenatal care.  2. [redacted] weeks gestation of pregnancy   3. Chronic hypertension in pregnancy On Procardia  BP is ok today Normal labs last week  4. IUGR (intrauterine growth restriction) affecting care of mother, third trimester In weekly testing with dopplers with MFM  5. History of incompetent cervix, currently pregnant in third trimester Has cerclage in place  6. History of gestational diabetes mellitus (GDM) Needs 2 hour--to return for this next week  7. History of C-section Desires repeat and BTL - Ambulatory Referral For Surgery Scheduling  Preterm labor symptoms and general obstetric precautions including but not limited to vaginal bleeding, contractions, leaking of fluid and fetal movement were reviewed in detail with the patient. Please refer to After Visit Summary for other counseling recommendations.   Return in 2 weeks (on 04/26/2023).  Future Appointments  Date Time Provider Department Center  04/18/2023  8:00 AM [facility] NST [facility]  04/18/2023  8:30 AM [facility] US6 [facility]  04/19/2023  8:50 AM [facility]  04/25/2023  8:30 AM [facility] US6 [facility]  04/25/2023  9:35 AM Reva Bores, MD [facility]  05/02/2023 10:30 AM [facility] US5 [facility]  05/09/2023 10:15 AM Warden Fillers, MD [facility]  05/16/2023 11:15 AM Reva Bores, MD [facility]    Reva Bores, MD

## 2023-04-18 ENCOUNTER — Ambulatory Visit: Payer: MEDICAID | Attending: Obstetrics | Admitting: *Deleted

## 2023-04-18 ENCOUNTER — Ambulatory Visit (HOSPITAL_BASED_OUTPATIENT_CLINIC_OR_DEPARTMENT_OTHER): Payer: MEDICAID

## 2023-04-18 ENCOUNTER — Ambulatory Visit: Payer: MEDICAID | Attending: Obstetrics | Admitting: Obstetrics

## 2023-04-18 ENCOUNTER — Other Ambulatory Visit: Payer: Self-pay

## 2023-04-18 ENCOUNTER — Other Ambulatory Visit: Payer: Self-pay | Admitting: Obstetrics

## 2023-04-18 DIAGNOSIS — Z3A32 32 weeks gestation of pregnancy: Secondary | ICD-10-CM

## 2023-04-18 DIAGNOSIS — O09293 Supervision of pregnancy with other poor reproductive or obstetric history, third trimester: Secondary | ICD-10-CM

## 2023-04-18 DIAGNOSIS — O365930 Maternal care for other known or suspected poor fetal growth, third trimester, not applicable or unspecified: Secondary | ICD-10-CM

## 2023-04-18 DIAGNOSIS — O09213 Supervision of pregnancy with history of pre-term labor, third trimester: Secondary | ICD-10-CM | POA: Diagnosis not present

## 2023-04-18 DIAGNOSIS — O3433 Maternal care for cervical incompetence, third trimester: Secondary | ICD-10-CM | POA: Diagnosis not present

## 2023-04-18 DIAGNOSIS — O34219 Maternal care for unspecified type scar from previous cesarean delivery: Secondary | ICD-10-CM | POA: Diagnosis not present

## 2023-04-18 DIAGNOSIS — O43193 Other malformation of placenta, third trimester: Secondary | ICD-10-CM | POA: Insufficient documentation

## 2023-04-18 DIAGNOSIS — O10013 Pre-existing essential hypertension complicating pregnancy, third trimester: Secondary | ICD-10-CM | POA: Diagnosis not present

## 2023-04-18 DIAGNOSIS — E669 Obesity, unspecified: Secondary | ICD-10-CM | POA: Diagnosis not present

## 2023-04-18 DIAGNOSIS — O99212 Obesity complicating pregnancy, second trimester: Secondary | ICD-10-CM | POA: Diagnosis present

## 2023-04-18 DIAGNOSIS — O99213 Obesity complicating pregnancy, third trimester: Secondary | ICD-10-CM | POA: Insufficient documentation

## 2023-04-18 DIAGNOSIS — O365990 Maternal care for other known or suspected poor fetal growth, unspecified trimester, not applicable or unspecified: Secondary | ICD-10-CM

## 2023-04-18 DIAGNOSIS — O365920 Maternal care for other known or suspected poor fetal growth, second trimester, not applicable or unspecified: Secondary | ICD-10-CM | POA: Diagnosis present

## 2023-04-18 NOTE — Progress Notes (Signed)
 MFM Consult Note  Shonna Deiter is currently at 32 weeks and 3 days.  She has been followed due to maternal obesity with a BMI of 51 and severe IUGR.  The EFW measured last week was at the 3rd percentile.  She reports that she has been unable to eat much because she feels nauseous.  She is picking up a prescription for antinausea medication later today.    She reports feeling fetal movements throughout the day.  A biophysical profile performed today was 8/10.  She had a nonreactive NST today.  There was normal amniotic fluid noted with a total AFI of 10.97 cm.    Doppler studies of the umbilical arteries performed today showed a normal S/D ratio of 3.15.  There were no signs of absent or reversed end-diastolic flow.  Due to severe IUGR and her nonreactive NST today, she will return in 4 days (next Monday) for another NST.    She will have another BPP and umbilical artery Doppler study performed next Thursday (in 7 days).    Fetal kick count instructions were reviewed.  The patient stated that all of her questions were answered today.  A total of 20 minutes was spent counseling and coordinating the care for this patient.  Greater than 50% of the time was spent in direct face-to-face contact.

## 2023-04-18 NOTE — Procedures (Signed)
 Margaret Ingram [birth year] [redacted]w[redacted]d  Fetus A Non-Stress Test Interpretation for 04/18/23-NST with BPP  Indication: IUGR and obese  Fetal Heart Rate A Mode: External Baseline Rate (A): 140 bpm Variability: Moderate, Minimal Accelerations: None Decelerations: None Multiple birth?: No  Uterine Activity Mode: Toco Contraction Frequency (min): none Resting Tone Palpated: Relaxed  Interpretation (Fetal Testing) Nonstress Test Interpretation: Non-reactive Comments: Tracing reviewed by Dr. Parke Poisson

## 2023-04-19 ENCOUNTER — Other Ambulatory Visit: Payer: MEDICAID

## 2023-04-19 ENCOUNTER — Other Ambulatory Visit: Payer: Self-pay

## 2023-04-19 DIAGNOSIS — O0990 Supervision of high risk pregnancy, unspecified, unspecified trimester: Secondary | ICD-10-CM

## 2023-04-19 MED ORDER — ACCU-CHEK SOFTCLIX LANCETS MISC: 12 refills | Status: DC

## 2023-04-19 MED ORDER — ACCU-CHEK GUIDE TEST VI STRP: ORAL_STRIP | 12 refills | Status: DC

## 2023-04-19 MED ORDER — ACCU-CHEK GUIDE W/DEVICE KIT: 1.0000 | PACK | Freq: Once | 0 refills | Status: AC

## 2023-04-19 NOTE — Progress Notes (Signed)
 Patient in office for 2 hour gtt. She threw up for the second time of testing. Spoke with Dr. Crissie Reese and Dr. Nobie Putnam who recommended she check her blood sugars 4 times a day for 2 weeks. Rx for Accuchek supplies sent to Pharmacy. Blood sugar log given. Instructions given to check blood sugars fasting and 2 hours after the first bite of breakfast, lunch and dinner. Advised to bring log back during appointments.

## 2023-04-22 ENCOUNTER — Ambulatory Visit: Payer: MEDICAID | Attending: Obstetrics | Admitting: *Deleted

## 2023-04-22 ENCOUNTER — Other Ambulatory Visit: Payer: Self-pay | Admitting: *Deleted

## 2023-04-22 ENCOUNTER — Ambulatory Visit: Payer: MEDICAID

## 2023-04-22 DIAGNOSIS — E669 Obesity, unspecified: Secondary | ICD-10-CM | POA: Diagnosis not present

## 2023-04-22 DIAGNOSIS — O3433 Maternal care for cervical incompetence, third trimester: Secondary | ICD-10-CM | POA: Diagnosis not present

## 2023-04-22 DIAGNOSIS — O365930 Maternal care for other known or suspected poor fetal growth, third trimester, not applicable or unspecified: Secondary | ICD-10-CM

## 2023-04-22 DIAGNOSIS — Z8632 Personal history of gestational diabetes: Secondary | ICD-10-CM | POA: Insufficient documentation

## 2023-04-22 DIAGNOSIS — O09213 Supervision of pregnancy with history of pre-term labor, third trimester: Secondary | ICD-10-CM | POA: Insufficient documentation

## 2023-04-22 DIAGNOSIS — O10013 Pre-existing essential hypertension complicating pregnancy, third trimester: Secondary | ICD-10-CM

## 2023-04-22 DIAGNOSIS — O34219 Maternal care for unspecified type scar from previous cesarean delivery: Secondary | ICD-10-CM | POA: Insufficient documentation

## 2023-04-22 DIAGNOSIS — O43193 Other malformation of placenta, third trimester: Secondary | ICD-10-CM | POA: Diagnosis not present

## 2023-04-22 DIAGNOSIS — O99213 Obesity complicating pregnancy, third trimester: Secondary | ICD-10-CM

## 2023-04-22 DIAGNOSIS — O365990 Maternal care for other known or suspected poor fetal growth, unspecified trimester, not applicable or unspecified: Secondary | ICD-10-CM

## 2023-04-22 DIAGNOSIS — O09293 Supervision of pregnancy with other poor reproductive or obstetric history, third trimester: Secondary | ICD-10-CM | POA: Insufficient documentation

## 2023-04-22 DIAGNOSIS — Z3A32 32 weeks gestation of pregnancy: Secondary | ICD-10-CM | POA: Diagnosis not present

## 2023-04-22 DIAGNOSIS — O10913 Unspecified pre-existing hypertension complicating pregnancy, third trimester: Secondary | ICD-10-CM

## 2023-04-22 NOTE — Procedures (Signed)
 Margaret Ingram Nov 24, 1995 [redacted]w[redacted]d  Fetus A Non-Stress Test Interpretation for 04/22/23  Indication: IUGR, CHTN, obesity  Fetal Heart Rate A Mode: External Baseline Rate (A): 135 bpm Variability: Moderate Accelerations: 10 x 10 Decelerations: None Multiple birth?: No  Uterine Activity Mode: Palpation, Toco Contraction Frequency (min): None Resting Tone Palpated: Relaxed Resting Time: Adequate  Interpretation (Fetal Testing) Nonstress Test Interpretation: Non-reactive (Patient states she had breakfast. Positions changed. Still non-reactive after prolonged monitoring.) Comments: Dr. Darra Lis reviewed tracing. BPP and UA doppler added.

## 2023-04-24 ENCOUNTER — Inpatient Hospital Stay (INDEPENDENT_AMBULATORY_CARE_PROVIDER_SITE_OTHER)
Admission: AD | Admit: 2023-04-24 | Discharge: 2023-04-25 | Discharge status: Against Medical Advice | Payer: MEDICAID | Source: Home / Self Care | Attending: Obstetrics and Gynecology | Admitting: Obstetrics and Gynecology

## 2023-04-24 ENCOUNTER — Encounter (HOSPITAL_COMMUNITY): Payer: Self-pay | Admitting: Obstetrics and Gynecology

## 2023-04-24 DIAGNOSIS — N858 Other specified noninflammatory disorders of uterus: Secondary | ICD-10-CM | POA: Insufficient documentation

## 2023-04-24 DIAGNOSIS — O4693 Antepartum hemorrhage, unspecified, third trimester: Secondary | ICD-10-CM | POA: Insufficient documentation

## 2023-04-24 DIAGNOSIS — O3433 Maternal care for cervical incompetence, third trimester: Secondary | ICD-10-CM | POA: Insufficient documentation

## 2023-04-24 DIAGNOSIS — O09213 Supervision of pregnancy with history of pre-term labor, third trimester: Secondary | ICD-10-CM | POA: Insufficient documentation

## 2023-04-24 DIAGNOSIS — O34211 Maternal care for low transverse scar from previous cesarean delivery: Secondary | ICD-10-CM | POA: Insufficient documentation

## 2023-04-24 DIAGNOSIS — Z3A33 33 weeks gestation of pregnancy: Secondary | ICD-10-CM | POA: Insufficient documentation

## 2023-04-24 LAB — URINALYSIS, ROUTINE W REFLEX MICROSCOPIC
Bacteria, UA: NONE SEEN
Bilirubin Urine: NEGATIVE
Glucose, UA: NEGATIVE mg/dL
Ketones, ur: NEGATIVE mg/dL
Nitrite: NEGATIVE
Protein, ur: 30 mg/dL — AB
Specific Gravity, Urine: 1.024 (ref 1.005–1.030)
pH: 6 (ref 5.0–8.0)

## 2023-04-24 LAB — RUPTURE OF MEMBRANE (ROM)PLUS: Rom Plus: NEGATIVE

## 2023-04-24 MED ORDER — ACETAMINOPHEN-CAFFEINE 500-65 MG PO TABS
2.0000 | ORAL_TABLET | Freq: Once | ORAL | Status: DC
  Filled 2023-04-24: qty 2

## 2023-04-24 NOTE — MAU Note (Signed)
..  Margaret Ingram is a 28 y.o. at [redacted]w[redacted]d here in MAU reporting: leaking of fluid, mostly when she coughs, laughs, and once when she went to bathroom and saw (brown and dark red) blood on legs and tissue +FM Abdominal cramping that started this morning.  Takes baby aspirin and procardia, last time she took it was 9 am.   Pain score: 1/10 Vitals:   04/24/23 2150  BP: (!) 149/98  Pulse: 81  Resp: 20  Temp: 97.9 F (36.6 C)  SpO2: 99%     FHT:140 Lab orders placed from triage:  UA

## 2023-04-24 NOTE — MAU Provider Note (Signed)
 Chief Complaint:  No chief complaint on file.   Event Date/Time   First Provider Initiated Contact with Patient 04/24/23 2253     HPI: Margaret Ingram is a 28 y.o. [id number] at 7w1dwho presents to maternity admissions reporting vaginal bleeding.  Has a cerclage due to a history of prior preterm births. Has had BMZ and Magnesium Sulfate. Thinks she may be leaking.  Not feeling any contractions. . She reports good fetal movement, denies LOF, vaginal bleeding, vaginal itching/burning, urinary symptoms, h/a, dizziness, n/v, diarrhea, constipation or fever/chills.  She denies headache, visual changes or RUQ abdominal pain.  Vaginal Bleeding The patient's primary symptoms include vaginal bleeding. The patient's pertinent negatives include no genital itching or pelvic pain. This is a recurrent problem. The current episode started today. She is pregnant. Pertinent negatives include no abdominal pain, back pain, constipation, diarrhea or fever. The vaginal discharge was bloody. The vaginal bleeding is lighter than menses. She has not been passing clots. She has not been passing tissue. Nothing aggravates the symptoms. She has tried nothing for the symptoms.   RN Note: Margaret Ingram is a 28 y.o. at [redacted]w[redacted]d here in MAU reporting: leaking of fluid, mostly when she coughs, laughs, and once when she went to bathroom and saw (brown and dark red) blood on legs and tissue +FM Abdominal cramping that started this morning.  Takes baby aspirin and procardia, last time she took it was 9 am.                    Pain score: 1/10  Past Medical History: Past Medical History:  Diagnosis Date   Anxiety    Asthma    Chronic hypertension affecting pregnancy 11/26/2022   Depression    with pregnancies   Diabetes mellitus without complication (HCC)    pre diabetic   Fibroid    Headache    Heart murmur    Hx of chlamydia infection 08/17/2018   Hypertension    chronic   Infection    UTI   Pre-diabetes 06/22/2019    Pregnancy induced hypertension    Preterm contractions 06/12/2017   Seizures (HCC)    anxiety    Past obstetric history: OB History  Gravida Para Term Preterm AB Living  6 5 2 3  4   SAB IAB Ectopic Multiple Live Births     0 5    # Outcome Date GA Lbr Len/2nd Weight Sex Type Anes PTL Lv  6 Current           5 Preterm 04/05/21 [redacted]w[redacted]d  2305 g F CS-LTranv Spinal  LIV     Complications: Severe pre-eclampsia  4 Preterm 06/22/19 [redacted]w[redacted]d  1050 g M CS-LTranv Gen  DEC     Complications: Preterm labor, Preterm premature rupture of membranes in third trimester  3 Preterm 08/2018 [redacted]w[redacted]d    Vag-Spont   LIV     Complications: Preterm labor  2 Term 08/12/17 [redacted]w[redacted]d   M Vag-Spont   LIV     Complications: Preeclampsia  1 Term 02/03/16 [redacted]w[redacted]d   M Vag-Spont   LIV     Complications: Preeclampsia    Past Surgical History: Past Surgical History:  Procedure Laterality Date   CERVICAL CERCLAGE  01/28/2023   CERVICAL CERCLAGE  10/2020   rescue cerclage   CESAREAN SECTION N/A 06/22/2019   Procedure: CESAREAN SECTION;  Surgeon: Adam Phenix, MD;  Location: [facility];  Service: Obstetrics;  Laterality: N/A;  STAT Cord prolapse Liletta  CESAREAN SECTION  03/2021   and cerclage removal   CESAREAN SECTION     WISDOM TOOTH EXTRACTION      Family History: Family History  Problem Relation Age of Onset   Asthma Mother    Healthy Father    Gout Paternal Uncle    Hypertension Paternal Uncle    Cancer Maternal Grandmother    Gout Paternal Grandmother    Hypertension Paternal Grandmother    Diabetes Other     Social History: Social History   Tobacco Use   Smoking status: Never   Smokeless tobacco: Never   Tobacco comments:    occ  Vaping Use   Vaping status: Never Used  Substance Use Topics   Alcohol use: Not Currently    Comment: occasionally    Drug use: Not Currently    Types: Marijuana    Comment: "not since I got pregnant"    Allergies:  Allergies  Allergen Reactions    Grapefruit Concentrate Swelling   Azithromycin Nausea And Vomiting and Nausea Only   Other Rash    *Brown Band-Aid*   Wound Dressing Adhesive Rash    *Brown Band-Aid*    Meds:  Medications Prior to Admission  Medication Sig Dispense Refill Last Dose/Taking   acetaminophen (TYLENOL) 325 MG tablet Take 650 mg by mouth every 6 (six) hours as needed for mild pain or headache.   04/23/2023   aspirin 81 MG chewable tablet Chew 2 tablets (162 mg total) by mouth daily. 60 tablet 2 04/24/2023 Morning   butalbital-acetaminophen-caffeine (FIORICET) 50-325-40 MG tablet Take 1-2 tablets by mouth every 6 (six) hours as needed for headache. 20 tablet 0 Past Week   cyclobenzaprine (FLEXERIL) 10 MG tablet Take 1 tablet (10 mg total) by mouth 3 (three) times daily as needed for muscle spasms. 30 tablet 0 Past Week   NIFEdipine (ADALAT CC) 30 MG 24 hr tablet Take 1 tablet (30 mg total) by mouth daily. 60 tablet 2 04/24/2023 Morning   ondansetron (ZOFRAN-ODT) 4 MG disintegrating tablet Take 1 tablet (4 mg total) by mouth every 8 (eight) hours as needed for nausea. 5 tablet 0 Past Week   Prenatal Vit-Fe Fumarate-FA (MULTIVITAMIN-PRENATAL) 27-0.8 MG TABS tablet Take 1 tablet by mouth daily. 30 tablet 0 04/24/2023 Morning   valACYclovir (VALTREX) 500 MG tablet Take 1 tablet (500 mg total) by mouth 2 (two) times daily. 60 tablet 2 04/24/2023 Morning   Accu-Chek Softclix Lancets lancets To check blood sugars 4 times a day. Fasting and 2 hours after the first bite of breakfast, lunch and dinner. 100 each 12    Blood Pressure Monitoring (ADULT BLOOD PRESSURE CUFF LG) KIT 1 kit by Does not apply route daily. Call or return to the hospital if your blood pressure is running higher than 160 for the top number or 110 for the bottom number.  Check your blood pressure if you experience a headache that does not resolve or changes in your vision or other severe symptoms. 1 kit 0    glucose blood (ACCU-CHEK GUIDE TEST) test strip To check  blood sugars 4 times a day. Fasting and 2 hours after the first bite of breakfast, lunch and dinner. 100 each 12    lidocaine (LIDODERM) 5 % Place 1 patch onto the skin daily. Remove & Discard patch within 12 hours or as directed by MD (Patient not taking: Reported on 04/12/2023) 30 patch 0    promethazine (PHENERGAN) 25 MG tablet Take 1 tablet (25 mg total) by mouth  every 6 (six) hours as needed for nausea or vomiting. (Patient not taking: Reported on 04/12/2023) 30 tablet 1     I have reviewed patient's Past Medical Hx, Surgical Hx, Family Hx, Social Hx, medications and allergies.   ROS:  Review of Systems  Constitutional:  Negative for fever.  Gastrointestinal:  Negative for abdominal pain, constipation and diarrhea.  Genitourinary:  Positive for vaginal bleeding. Negative for pelvic pain.  Musculoskeletal:  Negative for back pain.   Other systems negative  Physical Exam  Patient Vitals for the past 24 hrs:  BP Temp Temp src Pulse Resp SpO2 Height Weight  04/24/23 2150 (!) 149/98 97.9 F (36.6 C) Oral 81 20 99 % 5\' 2"  (1.575 m) 126.7 kg   Constitutional: Well-developed, well-nourished female in no acute distress.  Cardiovascular: normal rate Respiratory: normal effort GI: Abd soft, non-tender, gravid appropriate for gestational age.   No rebound or guarding. MS: Extremities nontender, no edema, normal ROM Neurologic: Alert and oriented x 4.  GU: Neg CVAT.  PELVIC EXAM: Cervix pink, visually closed, small to moderate dark red blood in vault (mucous like), no free flow from os Cervix closed/50%/-3/vertex    FHT:  Baseline 140 , moderate variability, accelerations present, no decelerations Contractions:  Rare   Labs: Results for orders placed or performed during the hospital encounter of 04/24/23 (from the past 24 hours)  Urinalysis, Routine w reflex microscopic -Urine, Clean Catch     Status: Abnormal   Collection Time: 04/24/23 10:04 PM  Result Value Ref Range   Color, Urine  YELLOW YELLOW   APPearance HAZY (A) CLEAR   Specific Gravity, Urine 1.024 1.005 - 1.030   pH 6.0 5.0 - 8.0   Glucose, UA NEGATIVE NEGATIVE mg/dL   Hgb urine dipstick LARGE (A) NEGATIVE   Bilirubin Urine NEGATIVE NEGATIVE   Ketones, ur NEGATIVE NEGATIVE mg/dL   Protein, ur 30 (A) NEGATIVE mg/dL   Nitrite NEGATIVE NEGATIVE   Leukocytes,Ua SMALL (A) NEGATIVE   RBC / HPF 11-20 0 - 5 RBC/hpf   WBC, UA 21-50 0 - 5 WBC/hpf   Bacteria, UA NONE SEEN NONE SEEN   Squamous Epithelial / HPF 6-10 0 - 5 /HPF   Mucus PRESENT   Rupture of Membrane (ROM) Plus     Status: None   Collection Time: 04/24/23 11:10 PM  Result Value Ref Range   Rom Plus NEGATIVE     --/--/A POS (01/13 0102)  Imaging:    MAU Course/MDM: I have reviewed the triage vital signs and the nursing notes.   Pertinent labs & imaging results that were available during my care of the patient were reviewed by me and considered in my medical decision making (see chart for details).      I have reviewed her medical records including past results, notes and treatments.   I have ordered labs and reviewed results.  NST reviewed Consult Dr Para March with presentation, exam findings and test results.  She recommends admission for observation.  Patient declined to stay.  States "I just can't"   States she has an appt at [facility] at 8am and will come back if bleeding worsens or other symptoms develop   Signed out AMA  Treatments in MAU included EFM, SSE .    Assessment: Single IUP at [redacted]w[redacted]d History of preterm birth Cerclage in place Vaginal bleeding  Plan: Left AMA Preterm Labor and bleeding precautions and fetal kick counts Follow up in Office for prenatal visit today Encouraged to return if  she develops worsening of symptoms, increase in pain, fever, or other concerning symptoms.   Pt stable at time of discharge.  Wynelle Bourgeois CNM, MSN Certified Nurse-Midwife 04/24/2023 10:53 PM

## 2023-04-25 ENCOUNTER — Ambulatory Visit: Payer: MEDICAID | Admitting: Family Medicine

## 2023-04-25 ENCOUNTER — Encounter (HOSPITAL_COMMUNITY): Payer: Self-pay | Admitting: Obstetrics & Gynecology

## 2023-04-25 ENCOUNTER — Ambulatory Visit: Payer: MEDICAID

## 2023-04-25 ENCOUNTER — Encounter: Payer: Self-pay | Admitting: Family Medicine

## 2023-04-25 ENCOUNTER — Other Ambulatory Visit: Payer: Self-pay | Admitting: Obstetrics

## 2023-04-25 ENCOUNTER — Ambulatory Visit (HOSPITAL_BASED_OUTPATIENT_CLINIC_OR_DEPARTMENT_OTHER): Payer: MEDICAID

## 2023-04-25 ENCOUNTER — Other Ambulatory Visit: Payer: Self-pay

## 2023-04-25 ENCOUNTER — Inpatient Hospital Stay (HOSPITAL_BASED_OUTPATIENT_CLINIC_OR_DEPARTMENT_OTHER): Payer: MEDICAID | Admitting: Obstetrics

## 2023-04-25 ENCOUNTER — Inpatient Hospital Stay (HOSPITAL_COMMUNITY)
Admission: AD | Admit: 2023-04-25 | Discharge: 2023-05-03 | DRG: 784 | Disposition: A | Discharge status: Home / Self Care | Payer: MEDICAID | Attending: Obstetrics & Gynecology | Admitting: Obstetrics & Gynecology

## 2023-04-25 VITALS — BP 160/98

## 2023-04-25 VITALS — BP 159/98 | HR 90 | Wt 279.0 lb

## 2023-04-25 DIAGNOSIS — Z3A33 33 weeks gestation of pregnancy: Secondary | ICD-10-CM

## 2023-04-25 DIAGNOSIS — O114 Pre-existing hypertension with pre-eclampsia, complicating childbirth: Secondary | ICD-10-CM | POA: Diagnosis present

## 2023-04-25 DIAGNOSIS — Z8249 Family history of ischemic heart disease and other diseases of the circulatory system: Secondary | ICD-10-CM

## 2023-04-25 DIAGNOSIS — O365930 Maternal care for other known or suspected poor fetal growth, third trimester, not applicable or unspecified: Secondary | ICD-10-CM | POA: Insufficient documentation

## 2023-04-25 DIAGNOSIS — B009 Herpesviral infection, unspecified: Secondary | ICD-10-CM

## 2023-04-25 DIAGNOSIS — O9963 Diseases of the digestive system complicating the puerperium: Secondary | ICD-10-CM | POA: Diagnosis not present

## 2023-04-25 DIAGNOSIS — O43193 Other malformation of placenta, third trimester: Secondary | ICD-10-CM

## 2023-04-25 DIAGNOSIS — O99892 Other specified diseases and conditions complicating childbirth: Secondary | ICD-10-CM | POA: Diagnosis not present

## 2023-04-25 DIAGNOSIS — O0990 Supervision of high risk pregnancy, unspecified, unspecified trimester: Secondary | ICD-10-CM | POA: Insufficient documentation

## 2023-04-25 DIAGNOSIS — Z833 Family history of diabetes mellitus: Secondary | ICD-10-CM | POA: Diagnosis not present

## 2023-04-25 DIAGNOSIS — Z8632 Personal history of gestational diabetes: Secondary | ICD-10-CM

## 2023-04-25 DIAGNOSIS — O34219 Maternal care for unspecified type scar from previous cesarean delivery: Secondary | ICD-10-CM

## 2023-04-25 DIAGNOSIS — K9189 Other postprocedural complications and disorders of digestive system: Secondary | ICD-10-CM | POA: Diagnosis not present

## 2023-04-25 DIAGNOSIS — O3433 Maternal care for cervical incompetence, third trimester: Secondary | ICD-10-CM

## 2023-04-25 DIAGNOSIS — O10013 Pre-existing essential hypertension complicating pregnancy, third trimester: Secondary | ICD-10-CM

## 2023-04-25 DIAGNOSIS — Z302 Encounter for sterilization: Secondary | ICD-10-CM

## 2023-04-25 DIAGNOSIS — K567 Ileus, unspecified: Secondary | ICD-10-CM | POA: Diagnosis not present

## 2023-04-25 DIAGNOSIS — Z98891 History of uterine scar from previous surgery: Secondary | ICD-10-CM | POA: Insufficient documentation

## 2023-04-25 DIAGNOSIS — N736 Female pelvic peritoneal adhesions (postinfective): Secondary | ICD-10-CM | POA: Diagnosis present

## 2023-04-25 DIAGNOSIS — Z9079 Acquired absence of other genital organ(s): Secondary | ICD-10-CM

## 2023-04-25 DIAGNOSIS — O09293 Supervision of pregnancy with other poor reproductive or obstetric history, third trimester: Secondary | ICD-10-CM

## 2023-04-25 DIAGNOSIS — O10919 Unspecified pre-existing hypertension complicating pregnancy, unspecified trimester: Secondary | ICD-10-CM

## 2023-04-25 DIAGNOSIS — Z7982 Long term (current) use of aspirin: Secondary | ICD-10-CM

## 2023-04-25 DIAGNOSIS — O10913 Unspecified pre-existing hypertension complicating pregnancy, third trimester: Secondary | ICD-10-CM | POA: Diagnosis not present

## 2023-04-25 DIAGNOSIS — O9832 Other infections with a predominantly sexual mode of transmission complicating childbirth: Secondary | ICD-10-CM | POA: Diagnosis present

## 2023-04-25 DIAGNOSIS — O9081 Anemia of the puerperium: Secondary | ICD-10-CM | POA: Diagnosis not present

## 2023-04-25 DIAGNOSIS — Z8759 Personal history of other complications of pregnancy, childbirth and the puerperium: Secondary | ICD-10-CM

## 2023-04-25 DIAGNOSIS — D62 Acute posthemorrhagic anemia: Secondary | ICD-10-CM | POA: Diagnosis not present

## 2023-04-25 DIAGNOSIS — O1092 Unspecified pre-existing hypertension complicating childbirth: Secondary | ICD-10-CM

## 2023-04-25 DIAGNOSIS — O1414 Severe pre-eclampsia complicating childbirth: Secondary | ICD-10-CM | POA: Diagnosis not present

## 2023-04-25 DIAGNOSIS — O99213 Obesity complicating pregnancy, third trimester: Secondary | ICD-10-CM

## 2023-04-25 DIAGNOSIS — O0993 Supervision of high risk pregnancy, unspecified, third trimester: Secondary | ICD-10-CM

## 2023-04-25 DIAGNOSIS — O4693 Antepartum hemorrhage, unspecified, third trimester: Secondary | ICD-10-CM

## 2023-04-25 DIAGNOSIS — Z3009 Encounter for other general counseling and advice on contraception: Secondary | ICD-10-CM

## 2023-04-25 DIAGNOSIS — O99210 Obesity complicating pregnancy, unspecified trimester: Secondary | ICD-10-CM

## 2023-04-25 DIAGNOSIS — O113 Pre-existing hypertension with pre-eclampsia, third trimester: Secondary | ICD-10-CM | POA: Diagnosis not present

## 2023-04-25 DIAGNOSIS — O288 Other abnormal findings on antenatal screening of mother: Secondary | ICD-10-CM | POA: Diagnosis not present

## 2023-04-25 DIAGNOSIS — E669 Obesity, unspecified: Secondary | ICD-10-CM

## 2023-04-25 DIAGNOSIS — O99214 Obesity complicating childbirth: Secondary | ICD-10-CM | POA: Diagnosis present

## 2023-04-25 DIAGNOSIS — O34211 Maternal care for low transverse scar from previous cesarean delivery: Secondary | ICD-10-CM | POA: Diagnosis present

## 2023-04-25 DIAGNOSIS — O365931 Maternal care for other known or suspected poor fetal growth, third trimester, fetus 1: Secondary | ICD-10-CM

## 2023-04-25 DIAGNOSIS — O1413 Severe pre-eclampsia, third trimester: Secondary | ICD-10-CM | POA: Diagnosis not present

## 2023-04-25 DIAGNOSIS — F32A Depression, unspecified: Secondary | ICD-10-CM

## 2023-04-25 DIAGNOSIS — A6000 Herpesviral infection of urogenital system, unspecified: Secondary | ICD-10-CM | POA: Diagnosis present

## 2023-04-25 DIAGNOSIS — I10 Essential (primary) hypertension: Secondary | ICD-10-CM | POA: Diagnosis present

## 2023-04-25 DIAGNOSIS — Z3A34 34 weeks gestation of pregnancy: Secondary | ICD-10-CM | POA: Diagnosis not present

## 2023-04-25 DIAGNOSIS — O09213 Supervision of pregnancy with history of pre-term labor, third trimester: Secondary | ICD-10-CM

## 2023-04-25 LAB — COMPREHENSIVE METABOLIC PANEL
ALT: 13 U/L (ref 0–44)
AST: 19 U/L (ref 15–41)
Albumin: 2.7 g/dL — ABNORMAL LOW (ref 3.5–5.0)
Alkaline Phosphatase: 62 U/L (ref 38–126)
Anion gap: 11 (ref 5–15)
BUN: 7 mg/dL (ref 6–20)
CO2: 19 mmol/L — ABNORMAL LOW (ref 22–32)
Calcium: 8.8 mg/dL — ABNORMAL LOW (ref 8.9–10.3)
Chloride: 108 mmol/L (ref 98–111)
Creatinine, Ser: 0.64 mg/dL (ref 0.44–1.00)
GFR, Estimated: >60 mL/min (ref >60)
Glucose, Bld: 149 mg/dL — ABNORMAL HIGH (ref 70–99)
Potassium: 3.6 mmol/L (ref 3.5–5.1)
Sodium: 138 mmol/L (ref 135–145)
Total Bilirubin: 0.5 mg/dL (ref 0.0–1.2)
Total Protein: 6.6 g/dL (ref 6.5–8.1)

## 2023-04-25 LAB — CBC
HCT: 30.3 % — ABNORMAL LOW (ref 36.0–46.0)
Hemoglobin: 10.3 g/dL — ABNORMAL LOW (ref 12.0–15.0)
MCH: 29.4 pg (ref 26.0–34.0)
MCHC: 34 g/dL (ref 30.0–36.0)
MCV: 86.6 fL (ref 80.0–100.0)
Platelets: 144 10*3/uL — ABNORMAL LOW (ref 150–400)
RBC: 3.5 MIL/uL — ABNORMAL LOW (ref 3.87–5.11)
RDW: 12.8 % (ref 11.5–15.5)
WBC: 8 10*3/uL (ref 4.0–10.5)
nRBC: 0 % (ref 0.0–0.2)

## 2023-04-25 LAB — TYPE AND SCREEN
ABO/RH(D): A POS
Antibody Screen: NEGATIVE

## 2023-04-25 MED ORDER — CALCIUM CARBONATE ANTACID 500 MG PO CHEW
2.0000 | CHEWABLE_TABLET | ORAL | Status: DC | PRN
  Administered 2023-04-27: 400 mg via ORAL
  Filled 2023-04-25: qty 2

## 2023-04-25 MED ORDER — ZOLPIDEM TARTRATE 5 MG PO TABS: 5.0000 mg | ORAL_TABLET | Freq: Every evening | ORAL | Status: DC | PRN

## 2023-04-25 MED ORDER — VALACYCLOVIR HCL 500 MG PO TABS
500.0000 mg | ORAL_TABLET | Freq: Two times a day (BID) | ORAL | Status: DC
  Administered 2023-04-25 – 2023-04-29 (×9): 500 mg via ORAL
  Filled 2023-04-25 (×10): qty 1

## 2023-04-25 MED ORDER — VALACYCLOVIR HCL 500 MG PO TABS: 500.0000 mg | ORAL_TABLET | Freq: Two times a day (BID) | ORAL | Status: DC

## 2023-04-25 MED ORDER — SODIUM CHLORIDE 0.9% FLUSH: 3.0000 mL | INTRAVENOUS | Status: DC | PRN

## 2023-04-25 MED ORDER — ACETAMINOPHEN 325 MG PO TABS
650.0000 mg | ORAL_TABLET | ORAL | Status: DC | PRN
  Administered 2023-04-29: 650 mg via ORAL
  Filled 2023-04-25: qty 2

## 2023-04-25 MED ORDER — HYDRALAZINE HCL 20 MG/ML IJ SOLN: 10.0000 mg | INTRAMUSCULAR | Status: DC | PRN

## 2023-04-25 MED ORDER — CALCIUM CARBONATE ANTACID 500 MG PO CHEW: 2.0000 | CHEWABLE_TABLET | ORAL | Status: DC | PRN

## 2023-04-25 MED ORDER — PRENATAL MULTIVITAMIN CH
1.0000 | ORAL_TABLET | Freq: Every day | ORAL | Status: DC
Start: 2023-04-26 — End: 2023-04-29
  Administered 2023-04-26 – 2023-04-29 (×4): 1 via ORAL
  Filled 2023-04-25 (×4): qty 1

## 2023-04-25 MED ORDER — SODIUM CHLORIDE 0.9% FLUSH: 3.0000 mL | Freq: Two times a day (BID) | INTRAVENOUS | Status: DC

## 2023-04-25 MED ORDER — ACETAMINOPHEN 325 MG PO TABS: 650.0000 mg | ORAL_TABLET | ORAL | Status: DC | PRN

## 2023-04-25 MED ORDER — BETAMETHASONE SOD PHOS & ACET 6 (3-3) MG/ML IJ SUSP
12.0000 mg | INTRAMUSCULAR | Status: DC
  Administered 2023-04-25: 12 mg via INTRAMUSCULAR
  Filled 2023-04-25: qty 5

## 2023-04-25 MED ORDER — LABETALOL HCL 5 MG/ML IV SOLN: 80.0000 mg | INTRAVENOUS | Status: DC | PRN

## 2023-04-25 MED ORDER — PRENATAL MULTIVITAMIN CH: 1.0000 | ORAL_TABLET | Freq: Every day | ORAL | Status: DC

## 2023-04-25 MED ORDER — BETAMETHASONE SOD PHOS & ACET 6 (3-3) MG/ML IJ SUSP
12.0000 mg | Freq: Once | INTRAMUSCULAR | Status: AC
  Administered 2023-04-26: 12 mg via INTRAMUSCULAR
  Filled 2023-04-25: qty 5

## 2023-04-25 MED ORDER — CYCLOBENZAPRINE HCL 5 MG PO TABS: 10.0000 mg | ORAL_TABLET | Freq: Three times a day (TID) | ORAL | Status: DC | PRN

## 2023-04-25 MED ORDER — ONDANSETRON 4 MG PO TBDP: 4.0000 mg | ORAL_TABLET | Freq: Three times a day (TID) | ORAL | Status: DC | PRN

## 2023-04-25 MED ORDER — NIFEDIPINE ER OSMOTIC RELEASE 30 MG PO TB24
30.0000 mg | ORAL_TABLET | Freq: Every day | ORAL | Status: DC
Start: 2023-04-25 — End: 2023-04-25

## 2023-04-25 MED ORDER — CYCLOBENZAPRINE HCL 10 MG PO TABS
10.0000 mg | ORAL_TABLET | Freq: Three times a day (TID) | ORAL | Status: DC | PRN
  Administered 2023-04-26: 10 mg via ORAL
  Filled 2023-04-25: qty 1

## 2023-04-25 MED ORDER — DOCUSATE SODIUM 100 MG PO CAPS
100.0000 mg | ORAL_CAPSULE | Freq: Every day | ORAL | Status: DC
  Administered 2023-04-26 – 2023-04-29 (×4): 100 mg via ORAL
  Filled 2023-04-25 (×5): qty 1

## 2023-04-25 MED ORDER — NIFEDIPINE ER OSMOTIC RELEASE 30 MG PO TB24
30.0000 mg | ORAL_TABLET | Freq: Every day | ORAL | Status: DC
Start: 2023-04-25 — End: 2023-04-26
  Administered 2023-04-25 – 2023-04-26 (×2): 30 mg via ORAL
  Filled 2023-04-25 (×2): qty 1

## 2023-04-25 MED ORDER — ASPIRIN 81 MG PO CHEW
162.0000 mg | CHEWABLE_TABLET | Freq: Every day | ORAL | Status: DC
  Administered 2023-04-25 – 2023-04-29 (×5): 162 mg via ORAL
  Filled 2023-04-25 (×6): qty 2

## 2023-04-25 MED ORDER — DOCUSATE SODIUM 100 MG PO CAPS: 100.0000 mg | ORAL_CAPSULE | Freq: Every day | ORAL | Status: DC

## 2023-04-25 MED ORDER — LABETALOL HCL 5 MG/ML IV SOLN: 40.0000 mg | INTRAVENOUS | Status: DC | PRN

## 2023-04-25 MED ORDER — SODIUM CHLORIDE 0.9% FLUSH
3.0000 mL | Freq: Two times a day (BID) | INTRAVENOUS | Status: DC
  Administered 2023-04-25 – 2023-04-27 (×5): 10 mL via INTRAVENOUS
  Administered 2023-04-28: 3 mL via INTRAVENOUS
  Administered 2023-04-28 – 2023-04-29 (×3): 10 mL via INTRAVENOUS

## 2023-04-25 MED ORDER — LABETALOL HCL 5 MG/ML IV SOLN: 20.0000 mg | INTRAVENOUS | Status: DC | PRN

## 2023-04-25 NOTE — H&P (Signed)
 Obstetrics Admission History & Physical  04/25/2023 - 8:01 PM Primary OBGYN: [facility]  Chief Complaint: Second episode of vaginal bleeding that occurred on 04/24/23, severe range BP that occurred in office today (04/25/23) and BPP of 6/10 (-fetal tone, - fetal movements) at MFM today. History of Present Illness  28 y.o. [id number] at [redacted]w[redacted]d, with the above CC. Pregnancy complicated by: previous admission on 02/28/2023-03/06/23 for bleeding, severe IUGR (1.5%), CHTN, history of severe preeclampsia, 20wk prophylactic McDonald (Ticron) cerclage placed, multiple prior PTBs, BMI 40s, h/o c-sections, CHTN, h/o HSV.  Patient had second episode of BRB on 04/24/23. She was seen in MAU and evaluated, had reassuring fetal testing but admission was recommended given her multiple co-morbidities.  She was given betamethasone dose while in MAU. Patient declined admission, signed AMA papers.  She was seen in office today, noted to have severe range BPs with systolic BP in 170s, no symptoms.  Again, admission was recommended and patient went to her scheduled MFM appointment. She had an ultrasound (see report below) that noted severe IUGR with EFW 1.5%, normal AFV, normal UA dopplers, but BPP 4/8 (negative for fetal movements and tone).  Patient had a reactive NST, so her total BPP 6/10.  Admission was also recommended by Dr. Parke Poisson (MFM), and patient came in as advised.  On encounter, she denies any severe headaches, visual symptoms, RUQ/epigastric pain or other concerning symptoms. Also reports fetal movement, but denies contractions, LOF and further bleeding.  Just reports brown discharge. Denies any fevers, chills, sweats, dysuria, nausea, vomiting, other GI or GU symptoms or other general symptoms.    Review of Systems:  as noted in the History of Present Illness.  Patient Active Problem List   Diagnosis Date Noted   History of C-section x 2 03/25/2023   Unwanted fertility 03/20/2023   IUGR (intrauterine growth  restriction) affecting care of mother, third trimester 03/14/2023   BMI 45.0-49.9, adult (HCC) 03/01/2023   Obesity in pregnancy 03/01/2023   HSV (herpes simplex virus) infection 03/01/2023   Vaginal bleeding in pregnancy, third trimester 02/28/2023   History of incompetent cervix, currently pregnant in third trimester 02/28/2023   Cervical cerclage suture present in third trimester 02/28/2023   Supervision of high risk pregnancy, antepartum 02/19/2023   Chronic hypertension in pregnancy 11/26/2022   History of preterm labor, current pregnancy, third trimester    History of severe pre-eclampsia 08/17/2018   Chronic hypertension 08/17/2018   Depression 08/17/2018   History of gestational diabetes mellitus (GDM) 03/26/2018     PMHx:  Past Medical History:  Diagnosis Date   Anxiety    Asthma    Chronic hypertension affecting pregnancy 11/26/2022   Depression    with pregnancies   Fibroid    Headache    Heart murmur    Hx of chlamydia infection 08/17/2018   Hypertension    chronic   Infection    UTI   Pre-diabetes 06/22/2019   Pregnancy induced hypertension    Seizures (HCC)    anxiety   PSHx:  Past Surgical History:  Procedure Laterality Date   CERVICAL CERCLAGE  01/28/2023   CERVICAL CERCLAGE  10/2020   rescue cerclage   CESAREAN SECTION N/A 06/22/2019   Procedure: CESAREAN SECTION;  Surgeon: Adam Phenix, MD;  Location: [facility] LD [facility]S;  Service: Obstetrics;  Laterality: N/A;  STAT Cord prolapse Liletta   CESAREAN SECTION  03/2021   and cerclage removal   CESAREAN SECTION     WISDOM TOOTH EXTRACTION  Medications:  Medications Prior to Admission  Medication Sig Dispense Refill Last Dose/Taking   Accu-Chek Softclix Lancets lancets To check blood sugars 4 times a day. Fasting and 2 hours after the first bite of breakfast, lunch and dinner. 100 each 12    acetaminophen (TYLENOL) 325 MG tablet Take 650 mg by mouth every 6 (six) hours as needed for mild pain or  headache.      aspirin 81 MG chewable tablet Chew 2 tablets (162 mg total) by mouth daily. 60 tablet 2    butalbital-acetaminophen-caffeine (FIORICET) 50-325-40 MG tablet Take 1-2 tablets by mouth every 6 (six) hours as needed for headache. 20 tablet 0    cyclobenzaprine (FLEXERIL) 10 MG tablet Take 1 tablet (10 mg total) by mouth 3 (three) times daily as needed for muscle spasms. 30 tablet 0    glucose blood (ACCU-CHEK GUIDE TEST) test strip To check blood sugars 4 times a day. Fasting and 2 hours after the first bite of breakfast, lunch and dinner. 100 each 12    NIFEdipine (ADALAT CC) 30 MG 24 hr tablet Take 1 tablet (30 mg total) by mouth daily. 60 tablet 2    ondansetron (ZOFRAN-ODT) 4 MG disintegrating tablet Take 1 tablet (4 mg total) by mouth every 8 (eight) hours as needed for nausea. 5 tablet 0    Prenatal Vit-Fe Fumarate-FA (MULTIVITAMIN-PRENATAL) 27-0.8 MG TABS tablet Take 1 tablet by mouth daily. 30 tablet 0    valACYclovir (VALTREX) 500 MG tablet Take 1 tablet (500 mg total) by mouth 2 (two) times daily. 60 tablet 2      Allergies: is allergic to grapefruit concentrate, azithromycin, other, and wound dressing adhesive. OBHx:  OB History  Gravida Para Term Preterm AB Living  6 5 2 3  4   SAB IAB Ectopic Multiple Live Births     0 5    # Outcome Date GA Lbr Len/2nd Weight Sex Type Anes PTL Lv  6 Current           5 Preterm 04/05/21 [redacted]w[redacted]d  2305 g F CS-LTranv Spinal  LIV     Complications: Severe pre-eclampsia  4 Preterm 06/22/19 [redacted]w[redacted]d  1050 g M CS-LTranv Gen  DEC     Complications: Preterm labor, Preterm premature rupture of membranes in third trimester  3 Preterm 08/2018 [redacted]w[redacted]d    Vag-Spont   LIV     Complications: Preterm labor  2 Term 08/12/17 [redacted]w[redacted]d   M Vag-Spont   LIV     Complications: Preeclampsia  1 Term 02/03/16 [redacted]w[redacted]d   M Vag-Spont   LIV     Complications: Preeclampsia            FHx:  Family History  Problem Relation Age of Onset   Asthma Mother    Healthy  Father    Cancer Maternal Grandmother    Gout Paternal Grandmother    Hypertension Paternal Grandmother    Diabetes Paternal Grandmother    Gout Paternal Uncle    Hypertension Paternal Uncle    Kidney failure Paternal Uncle    Diabetes Other    Soc Hx:  Social History   Socioeconomic History   Marital status: Single    Spouse name: Not on file   Number of children: Not on file   Years of education: Not on file   Highest education level: GED or equivalent  Occupational History   Not on file  Tobacco Use   Smoking status: Never   Smokeless tobacco: Never   Tobacco  comments:    occ  Vaping Use   Vaping status: Never Used  Substance and Sexual Activity   Alcohol use: Not Currently    Comment: occasionally    Drug use: Not Currently    Types: Marijuana    Comment: "not since I got pregnant"   Sexual activity: Not Currently    Birth control/protection: None  Other Topics Concern   Not on file  Social History Narrative   Not on file   Social Drivers of Health   Financial Resource Strain: Low Risk  (04/19/2023)   Overall Financial Resource Strain (CARDIA)    Difficulty of Paying Living Expenses: Not hard at all  Food Insecurity: No Food Insecurity (04/25/2023)   Hunger Vital Sign    Worried About Running Out of Food in the Last Year: Never true    Ran Out of Food in the Last Year: Never true  Transportation Needs: No Transportation Needs (04/25/2023)   PRAPARE - Administrator, Civil Service (Medical): No    Lack of Transportation (Non-Medical): No  Physical Activity: Insufficiently Active (04/19/2023)   Exercise Vital Sign    Days of Exercise per Week: 2 days    Minutes of Exercise per Session: 30 min  Stress: Stress Concern Present (04/19/2023)   [employer] of Occupational Health - Occupational Stress Questionnaire    Feeling of Stress : Rather much  Social Connections: Socially Isolated (04/19/2023)   Social Connection and Isolation Panel [NHANES]     Frequency of Communication with Friends and Family: More than three times a week    Frequency of Social Gatherings with Friends and Family: Once a week    Attends Religious Services: Never    Database administrator or Organizations: No    Attends Engineer, structural: Not on file    Marital Status: Never married  Intimate Partner Violence: Not At Risk (04/25/2023)   Humiliation, Afraid, Rape, and Kick questionnaire    Fear of Current or Ex-Partner: No    Emotionally Abused: No    Physically Abused: No    Sexually Abused: No    Objective   Patient Vitals for the past 24 hrs:  BP Temp Temp src Pulse Resp SpO2 Height Weight  04/25/23 1945 (!) 144/82 98 F (36.7 C) Oral 74 17 100 % -- --  04/25/23 1648 (!) 157/95 -- -- -- -- -- -- --  04/25/23 1628 -- -- -- -- -- -- 5\' 2"  (1.575 m) 126.1 kg  04/25/23 1608 (!) 157/95 98.1 F (36.7 C) Oral 79 16 98 % -- --   General: Well nourished, well developed female in no acute distress.  Skin:  Warm and dry.  Respiratory:  Normal respiratory effort Abdomen: obese, gravid, nttp Neuro/Psych:  Normal mood and affect.  Pelvic: deferred  Labs  pending No results for input(s): "WBC", "HGB", "HCT", "PLT" in the last 168 hours.   No results for input(s): "NA", "K", "CL", "CO2", "BUN", "CREATININE", "CALCIUM", "PROT", "BILITOT", "ALKPHOS", "ALT", "AST", "GLUCOSE" in the last 168 hours.  Invalid input(s): "LABALBU"   Radiology [place] MFM OB FOLLOW UP Result Date: 04/25/2023 ----------------------------------------------------------------------  OBSTETRICS REPORT                       (Signed Final 04/25/2023 05:20 pm) ---------------------------------------------------------------------- Patient Info  ID #:       [id number]  D.O.B.:  11/08/1995 (27 yrs)(F)  Name:       EMANUELLE HAMMERSTROM               Visit Date: 04/25/2023 08:50 am ---------------------------------------------------------------------- Performed By  Attending:         Ma Rings MD         Ref. Address:     [address]                                                             [place], [place]                                                             [address]  Performed By:     Tommie Raymond BS,       [facility]  Referred By:      [facility] ---------------------------------------------------------------------- Orders  #  Description                           Code        Ordered By  1  [place] MFM OB FOLLOW UP                   [phone]    YU FANG  2  [place] MFM UA CORD DOPPLER                76820.02    YU FANG  3  [place] MFM FETAL BPP WO NON               28413.24    YU FANG     STRESS ----------------------------------------------------------------------  #  Order #                     Accession #  Episode #  1  [id number]                   [id number]                 [id number]  2  [id number]                   [id number]                 [id number]  3  [id number]                   [id number]                 [id number] ---------------------------------------------------------------------- Indications  Maternal care for known or suspected poor      O36.5930  fetal growth, third trimester, single or  unspecified fetus IUGR  [redacted] weeks gestation of pregnancy                Z3A.33  Hypertension - Chronic/Pre-existing -          O10.019  Nifedipine  Obesity complicating pregnancy, third          O99.213  trimester (BMI 51)  Marginal insertion of umbilical cord affecting O43.193  management of mother in third trimester  Cervical cerclage suture present, third        O34.33  trimester (01/28/23)  History of cesarean delivery, currently        O34.219  pregnant X2  Poor obstetric history: Previous preterm       O09.219   delivery, antepartum ([redacted]w[redacted]d, [redacted]w[redacted]d, 32w)  Poor obstetric history: Previous               O09.299  preeclampsia / eclampsia/gestational HTN  Poor obstetric history: Previous gestational   O09.299  diabetes ---------------------------------------------------------------------- Vital Signs                                                 Height:        5'2" ---------------------------------------------------------------------- Fetal Evaluation  Num Of Fetuses:         1  Fetal Heart Rate(bpm):  123  Cardiac Activity:       Observed  Presentation:           Cephalic  Placenta:               Anterior  P. Cord Insertion:      Previously seen  Amniotic Fluid  AFI FV:      Within normal limits  AFI Sum(cm)     %Tile       Largest Pocket(cm)  11.79           31          3.38  RUQ(cm)       RLQ(cm)       LUQ(cm)        LLQ(cm)  3.38          3.38          2.91           2.12 ---------------------------------------------------------------------- Biophysical Evaluation  Amniotic F.V:   Pocket => 2 cm             F. Tone:        Not Observed  F. Movement:    Not Observed  Score:          4/8  F. Breathing:   Observed ---------------------------------------------------------------------- Biometry  BPD:      78.2  mm     G. Age:  31w 3d          6  %    CI:        75.13   %    70 - 86                                                          FL/HC:      21.0   %    19.9 - 21.5  HC:      286.2  mm     G. Age:  31w 3d          1  %    HC/AC:      1.12        0.96 - 1.11  AC:      255.9  mm     G. Age:  29w 5d        < 1  %    FL/BPD:     76.7   %    71 - 87  FL:         60  mm     G. Age:  31w 2d        4.5  %    FL/AC:      23.4   %    20 - 24  LV:        3.9  mm  Est. FW:    1589  gm      3 lb 8 oz    1.5  % ---------------------------------------------------------------------- OB History  Blood Type:   A+  Gravidity:    6         Term:   2        Prem:   3  Living:       4  ---------------------------------------------------------------------- Gestational Age  U/S Today:     31w 0d                                        EDD:   06/27/23  Best:          33w 1d     Det. By:  Marcella Dubs         EDD:   06/12/23                                      (11/14/22) ---------------------------------------------------------------------- Anatomy  Cranium:               Appears normal         LVOT:                   Previously seen  Cavum:                 Appears normal         Aortic Arch:            Previously seen  Ventricles:  Appears normal         Ductal Arch:            Not well visualized  Choroid Plexus:        Previously seen        Diaphragm:              Appears normal  Cerebellum:            Previously seen        Stomach:                Appears normal, left                                                                        sided  Posterior Fossa:       Previously seen        Abdomen:                Previously seen  Nuchal Fold:           Not applicable (>20    Abdominal Wall:         Previously seen                         wks GA)  Face:                  Orbits appear          Cord Vessels:           Previously seen                         normal  Lips:                  Not well visualized    Kidneys:                Appear normal  Palate:                Not well visualized    Bladder:                Appears normal  Thoracic:              Appears normal         Spine:                  Not well visualized  Heart:                 Appears normal         Upper Extremities:      Not well visualized                         (4CH, axis, and                         situs)  RVOT:                  Previously seen        Lower Extremities:      Not well visualized  Other:  Fetus appears to  be a female. ---------------------------------------------------------------------- Doppler - Fetal Vessels  Umbilical Artery   S/D     %tile      RI    %tile      PI    %tile     PSV    ADFV    RDFV                                                      (cm/s)   3.37       87     0.7       87    1.18       93    54.81      No      No ---------------------------------------------------------------------- Cervix Uterus Adnexa  Cervix  Not visualized (advanced GA >24wks)  Uterus  No abnormality visualized.  Right Ovary  Not visualized.  Left Ovary  Not visualized.  Cul De Sac  No free fluid seen.  Adnexa  No abnormality visualized ---------------------------------------------------------------------- Comments  [name] is currently at 33 weeks and 2 days.  She  has been followed due to maternal obesity with a BMI of 51  and severe IUGR.  The patient was seen in the MAU last  night due to vaginal bleeding.  Admission to the hospital was  recommended.  However, she signed out AGAINST  MEDICAL ADVICE.  She did receive the first dose of  betamethasone prior to signing out.  The patient's blood pressure today was 160/98.  The overall EFW obtained today was 3 pounds 8 ounces  (2nd percentile for her gestational age indicating severe  IUGR).  A biophysical profile performed today was 4 out of 8.  She  received a -2 for absent fetal movements and another -2  absent fetal tone.  There was normal amniotic fluid noted with a total AFI of  11.79 cm.  The fetus was in the vertex presentation.  Doppler studies of the umbilical arteries performed today  showed a normal S/D ratio of 3.37.  There were no signs of  absent or reversed end-diastolic flow.  I discussed her case with Dr. Crissie Reese.  Due to severe IUGR, her elevated blood pressures, the  abnormal fetal testing today, and her vaginal bleeding, I  recommend that the patient be admitted to the hospital for  observation and be worked up to determine if she has  preeclampsia.  She should receive the second dose of betamethasone this  evening.  Should she be diagnosed with preeclampsia, delivery is  recommended at around 34 weeks.  She should continue  inpatient  management until delivery.  Should preeclampsia be ruled out, inpatient versus outpatient  management will depend on her fetal testing and the amount  of vaginal bleeding she is experiencing.  A total of 30 minutes was spent counseling and coordinating  the care for this patient.  Greater than 50% of the time was  spent in direct face-to-face contact. ----------------------------------------------------------------------                  Ma Rings, MD Electronically Signed Final Report   04/25/2023 05:20 pm ----------------------------------------------------------------------   [place] MFM UA CORD DOPPLER Result Date: 04/25/2023 ----------------------------------------------------------------------  OBSTETRICS REPORT                       (  Signed Final 04/25/2023 05:20 pm) ---------------------------------------------------------------------- Patient Info  ID #:       [id number]                          D.O.B.:  08/10/1995 (27 yrs)(F)  Name:       [name]               Visit Date: 04/25/2023 08:50 am ---------------------------------------------------------------------- Performed By  Attending:        Ma Rings MD         Ref. Address:     [address]                                                             [place], [place]                                                             [address]  Performed By:     Tommie Raymond BS,       [facility]                                [facility]  Referred By:      [facility] ---------------------------------------------------------------------- Orders  #  Description                           Code        Ordered By  1  [place] MFM OB FOLLOW UP                   [phone]    YU FANG  2  [place] MFM UA CORD DOPPLER                76820.02    YU FANG  3  [place]  MFM FETAL BPP WO NON               [id number]  Rosana Hoes     STRESS ----------------------------------------------------------------------  #  Order #                     Accession #                Episode #  1  [id number]                   [id number]                 [id number]  2  [id number]                   [id number]                 [id number]  3  [id number]                   [id number]                 [id number] ---------------------------------------------------------------------- Indications  Maternal care for known or suspected poor      O36.5930  fetal growth, third trimester, single or  unspecified fetus IUGR  [redacted] weeks gestation of pregnancy                Z3A.33  Hypertension - Chronic/Pre-existing -          O10.019  Nifedipine  Obesity complicating pregnancy, third          O99.213  trimester (BMI 51)  Marginal insertion of umbilical cord affecting O43.193  management of mother in third trimester  Cervical cerclage suture present, third        O34.33  trimester (01/28/23)  History of cesarean delivery, currently        O34.219  pregnant X2  Poor obstetric history: Previous preterm       O09.219  delivery, antepartum ([redacted]w[redacted]d, [redacted]w[redacted]d, 32w)  Poor obstetric history: Previous               O09.299  preeclampsia / eclampsia/gestational HTN  Poor obstetric history: Previous gestational   O09.299  diabetes ---------------------------------------------------------------------- Vital Signs                                                 Height:        5'2" ---------------------------------------------------------------------- Fetal Evaluation  Num Of Fetuses:         1  Fetal Heart Rate(bpm):  123  Cardiac Activity:       Observed  Presentation:           Cephalic  Placenta:               Anterior  P. Cord Insertion:      Previously seen  Amniotic Fluid  AFI FV:      Within normal limits  AFI Sum(cm)     %Tile       Largest Pocket(cm)  11.79           31          3.38  RUQ(cm)       RLQ(cm)       LUQ(cm)        LLQ(cm)   3.38          3.38          2.91  2.12 ---------------------------------------------------------------------- Biophysical Evaluation  Amniotic F.V:   Pocket => 2 cm             F. Tone:        Not Observed  F. Movement:    Not Observed               Score:          4/8  F. Breathing:   Observed ---------------------------------------------------------------------- Biometry  BPD:      78.2  mm     G. Age:  31w 3d          6  %    CI:        75.13   %    70 - 86                                                          FL/HC:      21.0   %    19.9 - 21.5  HC:      286.2  mm     G. Age:  31w 3d          1  %    HC/AC:      1.12        0.96 - 1.11  AC:      255.9  mm     G. Age:  29w 5d        < 1  %    FL/BPD:     76.7   %    71 - 87  FL:         60  mm     G. Age:  31w 2d        4.5  %    FL/AC:      23.4   %    20 - 24  LV:        3.9  mm  Est. FW:    1589  gm      3 lb 8 oz    1.5  % ---------------------------------------------------------------------- OB History  Blood Type:   A+  Gravidity:    6         Term:   2        Prem:   3  Living:       4 ---------------------------------------------------------------------- Gestational Age  U/S Today:     31w 0d                                        EDD:   06/27/23  Best:          33w 1d     Det. ByMarcella Dubs         EDD:   06/12/23                                      (11/14/22) ---------------------------------------------------------------------- Anatomy  Cranium:               Appears normal         LVOT:  Previously seen  Cavum:                 Appears normal         Aortic Arch:            Previously seen  Ventricles:            Appears normal         Ductal Arch:            Not well visualized  Choroid Plexus:        Previously seen        Diaphragm:              Appears normal  Cerebellum:            Previously seen        Stomach:                Appears normal, left                                                                         sided  Posterior Fossa:       Previously seen        Abdomen:                Previously seen  Nuchal Fold:           Not applicable (>20    Abdominal Wall:         Previously seen                         wks GA)  Face:                  Orbits appear          Cord Vessels:           Previously seen                         normal  Lips:                  Not well visualized    Kidneys:                Appear normal  Palate:                Not well visualized    Bladder:                Appears normal  Thoracic:              Appears normal         Spine:                  Not well visualized  Heart:                 Appears normal         Upper Extremities:      Not well visualized                         (4CH, axis, and  situs)  RVOT:                  Previously seen        Lower Extremities:      Not well visualized  Other:  Fetus appears to be a female. ---------------------------------------------------------------------- Doppler - Fetal Vessels  Umbilical Artery   S/D     %tile      RI    %tile      PI    %tile     PSV    ADFV    RDFV                                                     (cm/s)   3.37       87     0.7       87    1.18       93    54.81      No      No ---------------------------------------------------------------------- Cervix Uterus Adnexa  Cervix  Not visualized (advanced GA >24wks)  Uterus  No abnormality visualized.  Right Ovary  Not visualized.  Left Ovary  Not visualized.  Cul De Sac  No free fluid seen.  Adnexa  No abnormality visualized ---------------------------------------------------------------------- Comments  [name] is currently at 33 weeks and 2 days.  She  has been followed due to maternal obesity with a BMI of 51  and severe IUGR.  The patient was seen in the MAU last  night due to vaginal bleeding.  Admission to the hospital was  recommended.  However, she signed out AGAINST  MEDICAL ADVICE.  She did receive the first dose of  betamethasone prior  to signing out.  The patient's blood pressure today was 160/98.  The overall EFW obtained today was 3 pounds 8 ounces  (2nd percentile for her gestational age indicating severe  IUGR).  A biophysical profile performed today was 4 out of 8.  She  received a -2 for absent fetal movements and another -2  absent fetal tone.  There was normal amniotic fluid noted with a total AFI of  11.79 cm.  The fetus was in the vertex presentation.  Doppler studies of the umbilical arteries performed today  showed a normal S/D ratio of 3.37.  There were no signs of  absent or reversed end-diastolic flow.  I discussed her case with Dr. Crissie Reese.  Due to severe IUGR, her elevated blood pressures, the  abnormal fetal testing today, and her vaginal bleeding, I  recommend that the patient be admitted to the hospital for  observation and be worked up to determine if she has  preeclampsia.  She should receive the second dose of betamethasone this  evening.  Should she be diagnosed with preeclampsia, delivery is  recommended at around 34 weeks.  She should continue  inpatient management until delivery.  Should preeclampsia be ruled out, inpatient versus outpatient  management will depend on her fetal testing and the amount  of vaginal bleeding she is experiencing.  A total of 30 minutes was spent counseling and coordinating  the care for this patient.  Greater than 50% of the time was  spent in direct face-to-face contact. ----------------------------------------------------------------------                  Ma Rings, MD  Electronically Signed Final Report   04/25/2023 05:20 pm ----------------------------------------------------------------------   [place] MFM FETAL BPP WO NON STRESS Result Date: 04/25/2023 ----------------------------------------------------------------------  OBSTETRICS REPORT                       (Signed Final 04/25/2023 05:20 pm) ---------------------------------------------------------------------- Patient Info  ID #:        [id number]                          D.O.B.:  [birth year] (27 yrs)(F)  Name:       [name]               Visit Date: 04/25/2023 08:50 am ---------------------------------------------------------------------- Performed By  Attending:        Ma Rings MD         Ref. Address:     [address]                                                             [place], [place]                                                             [address]  Performed By:     Tommie Raymond BS,       [facility]  Referred By:      [facility] ---------------------------------------------------------------------- Orders  #  Description                           Code        Ordered By  1  [place] MFM OB FOLLOW UP                   10272.53    YU FANG  2  [place] MFM UA CORD DOPPLER  86578.46    YU FANG  3  [place] MFM FETAL BPP WO NON               [id number]    YU FANG     STRESS ----------------------------------------------------------------------  #  Order #                     Accession #                Episode #  1  [id number]                   [id number]                 [id number]  2  [id number]                   [id number]                 [id number]  3  [id number]                   [id number]                 [id number] ---------------------------------------------------------------------- Indications  Maternal care for known or suspected poor      O36.5930  fetal growth, third trimester, single or  unspecified fetus IUGR  [redacted] weeks gestation of pregnancy                Z3A.33  Hypertension - Chronic/Pre-existing -          O10.019  Nifedipine  Obesity complicating pregnancy, third          O99.213  trimester (BMI 51)  Marginal insertion of umbilical cord affecting O43.193   management of mother in third trimester  Cervical cerclage suture present, third        O34.33  trimester (01/28/23)  History of cesarean delivery, currently        O34.219  pregnant X2  Poor obstetric history: Previous preterm       O09.219  delivery, antepartum ([redacted]w[redacted]d, [redacted]w[redacted]d, 32w)  Poor obstetric history: Previous               O09.299  preeclampsia / eclampsia/gestational HTN  Poor obstetric history: Previous gestational   O09.299  diabetes ---------------------------------------------------------------------- Vital Signs                                                 Height:        5'2" ---------------------------------------------------------------------- Fetal Evaluation  Num Of Fetuses:         1  Fetal Heart Rate(bpm):  123  Cardiac Activity:       Observed  Presentation:           Cephalic  Placenta:               Anterior  P. Cord Insertion:      Previously seen  Amniotic Fluid  AFI FV:      Within normal limits  AFI Sum(cm)     %Tile       Largest Pocket(cm)  11.79           31          3.38  RUQ(cm)       RLQ(cm)       LUQ(cm)  LLQ(cm)  3.38          3.38          2.91           2.12 ---------------------------------------------------------------------- Biophysical Evaluation  Amniotic F.V:   Pocket => 2 cm             F. Tone:        Not Observed  F. Movement:    Not Observed               Score:          4/8  F. Breathing:   Observed ---------------------------------------------------------------------- Biometry  BPD:      78.2  mm     G. Age:  31w 3d          6  %    CI:        75.13   %    70 - 86                                                          FL/HC:      21.0   %    19.9 - 21.5  HC:      286.2  mm     G. Age:  31w 3d          1  %    HC/AC:      1.12        0.96 - 1.11  AC:      255.9  mm     G. Age:  29w 5d        < 1  %    FL/BPD:     76.7   %    71 - 87  FL:         60  mm     G. Age:  31w 2d        4.5  %    FL/AC:      23.4   %    20 - 24  LV:        3.9  mm  Est. FW:    1589   gm      3 lb 8 oz    1.5  % ---------------------------------------------------------------------- OB History  Blood Type:   A+  Gravidity:    6         Term:   2        Prem:   3  Living:       4 ---------------------------------------------------------------------- Gestational Age  U/S Today:     31w 0d                                        EDD:   06/27/23  Best:          33w 1d     Det. ByMarcella Dubs         EDD:   06/12/23                                      (11/14/22) ---------------------------------------------------------------------- Anatomy  Cranium:  Appears normal         LVOT:                   Previously seen  Cavum:                 Appears normal         Aortic Arch:            Previously seen  Ventricles:            Appears normal         Ductal Arch:            Not well visualized  Choroid Plexus:        Previously seen        Diaphragm:              Appears normal  Cerebellum:            Previously seen        Stomach:                Appears normal, left                                                                        sided  Posterior Fossa:       Previously seen        Abdomen:                Previously seen  Nuchal Fold:           Not applicable (>20    Abdominal Wall:         Previously seen                         wks GA)  Face:                  Orbits appear          Cord Vessels:           Previously seen                         normal  Lips:                  Not well visualized    Kidneys:                Appear normal  Palate:                Not well visualized    Bladder:                Appears normal  Thoracic:              Appears normal         Spine:                  Not well visualized  Heart:                 Appears normal         Upper Extremities:      Not well visualized                         (  4CH, axis, and                         situs)  RVOT:                  Previously seen        Lower Extremities:      Not well visualized  Other:  Fetus appears to  be a female. ---------------------------------------------------------------------- Doppler - Fetal Vessels  Umbilical Artery   S/D     %tile      RI    %tile      PI    %tile     PSV    ADFV    RDFV                                                     (cm/s)   3.37       87     0.7       87    1.18       93    54.81      No      No ---------------------------------------------------------------------- Cervix Uterus Adnexa  Cervix  Not visualized (advanced GA >24wks)  Uterus  No abnormality visualized.  Right Ovary  Not visualized.  Left Ovary  Not visualized.  Cul De Sac  No free fluid seen.  Adnexa  No abnormality visualized ---------------------------------------------------------------------- Comments  [name] is currently at 33 weeks and 2 days.  She  has been followed due to maternal obesity with a BMI of 51  and severe IUGR.  The patient was seen in the MAU last  night due to vaginal bleeding.  Admission to the hospital was  recommended.  However, she signed out AGAINST  MEDICAL ADVICE.  She did receive the first dose of  betamethasone prior to signing out.  The patient's blood pressure today was 160/98.  The overall EFW obtained today was 3 pounds 8 ounces  (2nd percentile for her gestational age indicating severe  IUGR).  A biophysical profile performed today was 4 out of 8.  She  received a -2 for absent fetal movements and another -2  absent fetal tone.  There was normal amniotic fluid noted with a total AFI of  11.79 cm.  The fetus was in the vertex presentation.  Doppler studies of the umbilical arteries performed today  showed a normal S/D ratio of 3.37.  There were no signs of  absent or reversed end-diastolic flow.  I discussed her case with Dr. Crissie Reese.  Due to severe IUGR, her elevated blood pressures, the  abnormal fetal testing today, and her vaginal bleeding, I  recommend that the patient be admitted to the hospital for  observation and be worked up to determine if she has  preeclampsia.   She should receive the second dose of betamethasone this  evening.  Should she be diagnosed with preeclampsia, delivery is  recommended at around 34 weeks.  She should continue  inpatient management until delivery.  Should preeclampsia be ruled out, inpatient versus outpatient  management will depend on her fetal testing and the amount  of vaginal bleeding she is experiencing.  A total of 30 minutes was spent counseling and coordinating  the care for this patient.  Greater than 50% of the time  was  spent in direct face-to-face contact. ----------------------------------------------------------------------                  Ma Rings, MD Electronically Signed Final Report   04/25/2023 05:20 pm ----------------------------------------------------------------------   [place] MFM FETAL BPP W/NONSTRESS Result Date: 04/22/2023 ----------------------------------------------------------------------  OBSTETRICS REPORT                       (Signed Final 04/22/2023 01:00 pm) ---------------------------------------------------------------------- Patient Info  ID #:       [id number]                          D.O.B.:  1995/12/04 (27 yrs)(F)  Name:       [name]               Visit Date: 04/22/2023 10:31 am ---------------------------------------------------------------------- Performed By  Attending:        Braxton Feathers DO       Ref. Address:     [address]                                                             [place], [place]                                                             [address]  Performed By:     Dennis Bast RDMS      [facility]  Referred By:      [facility] ---------------------------------------------------------------------- Orders   #  Description                           Code        Ordered By  1  [place] MFM FETAL BPP  16109.6     BURK SCHAIBLE     W/NONSTRESS  2  [place] MFM UA CORD DOPPLER                [id number]    Braxton Feathers ----------------------------------------------------------------------  #  Order #                     Accession #                Episode #  1  [id number]                   [id number]                 [id number]  2  [id number]                   [id number]                 [id number] ---------------------------------------------------------------------- Indications  Maternal care for known or suspected poor      O36.5930  fetal growth, third trimester, single or  unspecified fetus IUGR  Hypertension - Chronic/Pre-existing -          O10.019  Nifedipine  Obesity complicating pregnancy, third          O99.213  trimester (BMI 51)  Marginal insertion of umbilical cord affecting O43.193  management of mother in third trimester  Cervical cerclage suture present, third        O34.33  trimester (01/28/23)  History of cesarean delivery, currently        O34.219  pregnant X2  Poor obstetric history: Previous preterm       O09.219  delivery, antepartum ([redacted]w[redacted]d, [redacted]w[redacted]d, 32w)  Poor obstetric history: Previous               O09.299  preeclampsia / eclampsia/gestational HTN  Poor obstetric history: Previous gestational   O09.299  diabetes  [redacted] weeks gestation of pregnancy                Z3A.32 ---------------------------------------------------------------------- Vital Signs                                                 Height:        5'2"  BP:          146/84 ---------------------------------------------------------------------- Fetal Evaluation  Num Of Fetuses:         1  Fetal Heart Rate(bpm):  133  Cardiac Activity:       Observed  Presentation:           Cephalic  Placenta:               Anterior  P. Cord Insertion:      Visualized  Amniotic Fluid  AFI FV:      Within normal limits  AFI Sum(cm)     %Tile       Largest  Pocket(cm)  12.19           34          4.11  RUQ(cm)       RLQ(cm)       LUQ(cm)        LLQ(cm)  3.23          3.44          1.41  4.11 ---------------------------------------------------------------------- Biophysical Evaluation  Amniotic F.V:   Within normal limits       F. Tone:        Observed  F. Movement:    Observed                   N.S.T:          Nonreactive  F. Breathing:   Observed                   Score:          8/10 ---------------------------------------------------------------------- OB History  Blood Type:   A+  Gravidity:    6         Term:   2        Prem:   3  Living:       4 ---------------------------------------------------------------------- Gestational Age  Best:          32w 5d     Det. By:  Marcella Dubs         EDD:   06/12/23                                      (11/14/22) ---------------------------------------------------------------------- Doppler - Fetal Vessels  Umbilical Artery   S/D     %tile      RI    %tile                      PSV    ADFV    RDFV                                                     (cm/s)   3.24       81    0.69       83                     42.15      No      No ---------------------------------------------------------------------- Cervix Uterus Adnexa  Cervix  Not visualized (advanced GA >24wks)  Uterus  No abnormality visualized.  Right Ovary  Not visualized.  Left Ovary  Not visualized.  Cul De Sac  No free fluid seen.  Adnexa  No abnormality visualized ---------------------------------------------------------------------- Comments  The patient is here for a follow-up ultrasound for FGR at 32w  5d. EDD: 06/12/2023. Dating: Early Ultrasound  (11/14/22).  She has no concerns today.  Sonographic findings  Single intrauterine pregnancy.  Fetal cardiac activity:  Observed and appears normal.  Presentation: Cephalic.  Interval fetal anatomy appears normal.  Amniotic fluid: Within normal limits.  MVP: 4.11 cm.  Placenta: Anterior.  Umbilical artery  dopplers findings:  -S/D:3.24 which are normal at this gestational age.  -Absent end-diastolic flow: No.  -Reversed end-diastolic flow:  No.  BPP 8/10 (NST reassuring but no 15x15 accels)  Recommendations  - Continue weekly UA dopplers and antenatal testing until  delivery.  - Serial growth [place] every 3 weeks until delivery.  There are limitations of prenatal ultrasound such as the  inability to detect certain abnormalities due to poor  visualization. Various factors such as fetal position,  gestational age and maternal body habitus may increase the  difficulty in visualizing the fetal anatomy. ----------------------------------------------------------------------  Braxton Feathers, DO Electronically Signed Final Report   04/22/2023 01:00 pm ----------------------------------------------------------------------   [place] MFM UA CORD DOPPLER Result Date: 04/22/2023 ----------------------------------------------------------------------  OBSTETRICS REPORT                       (Signed Final 04/22/2023 01:00 pm) ---------------------------------------------------------------------- Patient Info  ID #:       [id number]                          D.O.B.:  11-11-1995 (27 yrs)(F)  Name:       [name]               Visit Date: 04/22/2023 10:31 am ---------------------------------------------------------------------- Performed By  Attending:        Braxton Feathers DO       Ref. Address:     [address]                                                             [place], [place]                                                             [address]  Performed By:     Dennis Bast RDMS      [facility]  Referred By:      [facility]  ---------------------------------------------------------------------- Orders  #  Description                           Code        Ordered By  1  [place] MFM FETAL BPP                      62130.8     Charlestine Massed  SCHAIBLE     W/NONSTRESS  2  [place] MFM UA CORD DOPPLER                [id number]    Braxton Feathers ----------------------------------------------------------------------  #  Order #                     Accession #                Episode #  1  [id number]                   [id number]                 [id number]  2  [id number]                   [id number]                 [id number] ---------------------------------------------------------------------- Indications  Maternal care for known or suspected poor      O36.5930  fetal growth, third trimester, single or  unspecified fetus IUGR  Hypertension - Chronic/Pre-existing -          O10.019  Nifedipine  Obesity complicating pregnancy, third          O99.213  trimester (BMI 51)  Marginal insertion of umbilical cord affecting O43.193  management of mother in third trimester  Cervical cerclage suture present, third        O34.33  trimester (01/28/23)  History of cesarean delivery, currently        O34.219  pregnant X2  Poor obstetric history: Previous preterm       O09.219  delivery, antepartum ([redacted]w[redacted]d, [redacted]w[redacted]d, 32w)  Poor obstetric history: Previous               O09.299  preeclampsia / eclampsia/gestational HTN  Poor obstetric history: Previous gestational   O09.299  diabetes  [redacted] weeks gestation of pregnancy                Z3A.32 ---------------------------------------------------------------------- Vital Signs                                                 Height:        5'2"  BP:          146/84 ---------------------------------------------------------------------- Fetal Evaluation  Num Of Fetuses:         1  Fetal Heart Rate(bpm):  133  Cardiac Activity:       Observed  Presentation:           Cephalic  Placenta:               Anterior  P. Cord Insertion:      Visualized  Amniotic  Fluid  AFI FV:      Within normal limits  AFI Sum(cm)     %Tile       Largest Pocket(cm)  12.19           34          4.11  RUQ(cm)       RLQ(cm)       LUQ(cm)        LLQ(cm)  3.23          3.44          1.41  4.11 ---------------------------------------------------------------------- Biophysical Evaluation  Amniotic F.V:   Within normal limits       F. Tone:        Observed  F. Movement:    Observed                   N.S.T:          Nonreactive  F. Breathing:   Observed                   Score:          8/10 ---------------------------------------------------------------------- OB History  Blood Type:   A+  Gravidity:    6         Term:   2        Prem:   3  Living:       4 ---------------------------------------------------------------------- Gestational Age  Best:          32w 5d     Det. By:  Marcella Dubs         EDD:   06/12/23                                      (11/14/22) ---------------------------------------------------------------------- Doppler - Fetal Vessels  Umbilical Artery   S/D     %tile      RI    %tile                      PSV    ADFV    RDFV                                                     (cm/s)   3.24       81    0.69       83                     42.15      No      No ---------------------------------------------------------------------- Cervix Uterus Adnexa  Cervix  Not visualized (advanced GA >24wks)  Uterus  No abnormality visualized.  Right Ovary  Not visualized.  Left Ovary  Not visualized.  Cul De Sac  No free fluid seen.  Adnexa  No abnormality visualized ---------------------------------------------------------------------- Comments  The patient is here for a follow-up ultrasound for FGR at 32w  5d. EDD: 06/12/2023. Dating: Early Ultrasound  (11/14/22).  She has no concerns today.  Sonographic findings  Single intrauterine pregnancy.  Fetal cardiac activity:  Observed and appears normal.  Presentation: Cephalic.  Interval fetal anatomy appears normal.  Amniotic fluid:  Within normal limits.  MVP: 4.11 cm.  Placenta: Anterior.  Umbilical artery dopplers findings:  -S/D:3.24 which are normal at this gestational age.  -Absent end-diastolic flow: No.  -Reversed end-diastolic flow:  No.  BPP 8/10 (NST reassuring but no 15x15 accels)  Recommendations  - Continue weekly UA dopplers and antenatal testing until  delivery.  - Serial growth [place] every 3 weeks until delivery.  There are limitations of prenatal ultrasound such as the  inability to detect certain abnormalities due to poor  visualization. Various factors such as fetal position,  gestational age and maternal body habitus may increase the  difficulty in visualizing the fetal anatomy. ----------------------------------------------------------------------  Braxton Feathers, DO Electronically Signed Final Report   04/22/2023 01:00 pm ----------------------------------------------------------------------   [place] MFM UA CORD DOPPLER Result Date: 04/18/2023 ----------------------------------------------------------------------  OBSTETRICS REPORT                       (Signed Final 04/18/2023 10:34 am) ---------------------------------------------------------------------- Patient Info  ID #:       [id number]                          D.O.B.:  Jun 07, 1995 (27 yrs)(F)  Name:       [name]               Visit Date: 04/18/2023 09:04 am ---------------------------------------------------------------------- Performed By  Attending:        Ma Rings MD         Ref. Address:     [address]                                                             [place], [place]                                                             [address]  Performed By:     Octaviano Batty BS       [facility]                                     [facility]  Referred  By:      [facility] ---------------------------------------------------------------------- Orders  #  Description                           Code        Ordered By  1  [place] MFM UA CORD DOPPLER                76820.02    BURK SCHAIBLE  2  [place] MFM FETAL  BPP                      84132.4     Rosana Hoes     W/NONSTRESS ----------------------------------------------------------------------  #  Order #                     Accession #                Episode #  1  [id number]                   [id number]                 [id number]  2  [id number]                   [id number]                 [id number] ---------------------------------------------------------------------- Indications  Maternal care for known or suspected poor      O36.5930  fetal growth, third trimester, single or  unspecified fetus IUGR  Hypertension - Chronic/Pre-existing -          O10.019  Nifedipine  Obesity complicating pregnancy, third          O99.213  trimester (BMI 51)  Marginal insertion of umbilical cord affecting O43.193  management of mother in third trimester  Cervical cerclage suture present, third        O34.33  trimester (01/28/23)  History of cesarean delivery, currently        O34.219  pregnant X2  Poor obstetric history: Previous preterm       O09.219  delivery, antepartum ([redacted]w[redacted]d, [redacted]w[redacted]d, 32w)  Poor obstetric history: Previous               O09.299  preeclampsia / eclampsia/gestational HTN  Poor obstetric history: Previous gestational   O09.299  diabetes  [redacted] weeks gestation of pregnancy                Z3A.32 ---------------------------------------------------------------------- Vital Signs                                                 Height:        5'2"  BP:          139/83 ---------------------------------------------------------------------- Fetal Evaluation  Num Of Fetuses:         1  Fetal Heart Rate(bpm):  142  Cardiac Activity:       Observed  Presentation:           Cephalic  Placenta:               Anterior   P. Cord Insertion:      Previously seen  Amniotic Fluid  AFI FV:      Within normal limits  AFI Sum(cm)     %Tile       Largest Pocket(cm)  10.97           24          3.59  RUQ(cm)       RLQ(cm)       LUQ(cm)        LLQ(cm)  2.36          1.7           3.59  3.32 ---------------------------------------------------------------------- Biophysical Evaluation  Amniotic F.V:   Within normal limits       F. Tone:        Observed  F. Movement:    Observed                   N.S.T:          Nonreactive  F. Breathing:   Observed                   Score:          8/10 ---------------------------------------------------------------------- OB History  Blood Type:   A+  Gravidity:    6         Term:   2        Prem:   3  Living:       4 ---------------------------------------------------------------------- Gestational Age  Best:          32w 1d     Det. ByMarcella Dubs         EDD:   06/12/23                                      (11/14/22) ---------------------------------------------------------------------- Targeted Anatomy  Central Nervous System  Calvarium/Cranial V.:  Appears normal         Cereb./Vermis:          Not well visualized  Cavum:                 Not well visualized    Cisterna Magna:         Not well visualized  Lateral Ventricles:    Not well visualized    Midline Falx:           Not well visualized  Choroid Plexus:        Not well visualized  Spine  Cervical:              Not well visualized    Sacral:                 Not well visualized  Thoracic:              Not well visualized    Shape/Curvature:        Not well visualized  Lumbar:                Appears normal  Head/Neck  Lips:                  Previously seen        Profile:                Previously seen  Neck:                  Previously seen        Orbits/Eyes:            Appears normal  Nuchal Fold:           Previously seen        Mandible:               Appears normal  Nasal Bone:            Previously seen        Maxilla:                 Appears normal  Thorax  4 Chamber View:        Previously seen        [employer]. Septum:     Previously seen  Cardiac Rhythm:        Normal                 Cardiac Axis:           Previously seen  Cardiac Situs:         Appears normal         Diaphragm:              Appears normal  Rt Outflow Tract:      Previously seen        3 Vessel View:          Previously seen  Lt Outflow Tract:      Previously seen        3 V Trachea View:       Previously seen  Aortic Arch:           Previously seen        IVC:                    Previously seen  Ductal Arch:           Previously seen        Crossing:               Previously seen  SVC:                   Previously seen  Abdomen  Ventral Wall:          Previously seen        Lt Kidney:              Appears normal  Cord Insertion:        Previously seen        Rt Kidney:              Appears normal  Situs:                 Previously seen        Bladder:                Appears normal  Stomach:               Appears normal  Extremities  Lt Humerus:            Previously seen        Lt Femur:               Previously seen  Rt Humerus:            Previously seen        Rt Femur:               Previously seen  Lt Forearm:            Previously seen        Lt Lower Leg:           Previously seen  Rt Forearm:            Previously seen        Rt Lower Leg:           Previously seen  Lt Hand:               Previously seen        Lt Foot:  Previously seen  Rt Hand:               Previously seen        Rt Foot:                Previously seen  Other  Umbilical Cord:        Previously seen        Genitalia:              Female prev seen  Comment:     Technically difficult due to maternal habitus and fetal position. ---------------------------------------------------------------------- Doppler - Fetal Vessels  Umbilical Artery   S/D     %tile      RI    %tile      PI    %tile     PSV    ADFV    RDFV                                                     (cm/s)   3.15       77     0.68       78    1.13       87    35.04      No      No ---------------------------------------------------------------------- Cervix Uterus Adnexa  Cervix  Not visualized (advanced GA >24wks) ---------------------------------------------------------------------- Comments  Fotini Flight is currently at 32 weeks and 3 days.  She  has been followed due to maternal obesity with a BMI of 51  and severe IUGR.  The EFW measured last week was at the  3rd percentile.  She reports that she has been unable to eat  much because she feels nauseous.  She is picking up a  prescription for antinausea medication later today.  She reports feeling fetal movements throughout the day.  A biophysical profile performed today was 8/10.  She had a  nonreactive NST today.  There was normal amniotic fluid noted with a total AFI of  10.97 cm.  Doppler studies of the umbilical arteries performed today  showed a normal S/D ratio of 3.15.  There were no signs of  absent or reversed end-diastolic flow.  Due to severe IUGR and her nonreactive NST today, she will  return in 4 days (next Monday) for another NST.  She will have another BPP and umbilical artery Doppler study  performed next Thursday (in 7 days).  Fetal kick count instructions were reviewed.  The patient stated that all of her questions were answered  today.  A total of 20 minutes was spent counseling and coordinating  the care for this patient.  Greater than 50% of the time was  spent in direct face-to-face contact. ----------------------------------------------------------------------                   Ma Rings, MD Electronically Signed Final Report   04/18/2023 10:34 am ----------------------------------------------------------------------   [place] MFM FETAL BPP W/NONSTRESS Result Date: 04/18/2023 ----------------------------------------------------------------------  OBSTETRICS REPORT                       (Signed Final 04/18/2023 10:34 am)  ---------------------------------------------------------------------- Patient Info  ID #:       [id number]  D.O.B.:  05-13-1995 (27 yrs)(F)  Name:       RUNETTE SCIFRES               Visit Date: 04/18/2023 09:04 am ---------------------------------------------------------------------- Performed By  Attending:        Ma Rings MD         Ref. Address:     [address]                                                             [place], [place]                                                             [address]  Performed By:     Octaviano Batty BS       [facility]  Referred By:      [facility] ---------------------------------------------------------------------- Orders  #  Description                           Code        Ordered By  1  [place] MFM UA CORD DOPPLER                76820.02    Braxton Feathers  2  [place] MFM FETAL BPP                      25366.4     Rosana Hoes     W/NONSTRESS ----------------------------------------------------------------------  #  Order #                     Accession #                Episode #  1  [id number]  [id number]                 [id number]  2  [id number]                   [id number]                 [id number] ---------------------------------------------------------------------- Indications  Maternal care for known or suspected poor      O36.5930  fetal growth, third trimester, single or  unspecified fetus IUGR  Hypertension - Chronic/Pre-existing -          O10.019  Nifedipine  Obesity complicating pregnancy, third          O99.213  trimester (BMI 51)  Marginal insertion of umbilical cord affecting O43.193  management of mother in third trimester  Cervical cerclage suture present, third         O34.33  trimester (01/28/23)  History of cesarean delivery, currently        O34.219  pregnant X2  Poor obstetric history: Previous preterm       O09.219  delivery, antepartum ([redacted]w[redacted]d, [redacted]w[redacted]d, 32w)  Poor obstetric history: Previous               O09.299  preeclampsia / eclampsia/gestational HTN  Poor obstetric history: Previous gestational   O09.299  diabetes  [redacted] weeks gestation of pregnancy                Z3A.32 ---------------------------------------------------------------------- Vital Signs                                                 Height:        5'2"  BP:          139/83 ---------------------------------------------------------------------- Fetal Evaluation  Num Of Fetuses:         1  Fetal Heart Rate(bpm):  142  Cardiac Activity:       Observed  Presentation:           Cephalic  Placenta:               Anterior  P. Cord Insertion:      Previously seen  Amniotic Fluid  AFI FV:      Within normal limits  AFI Sum(cm)     %Tile       Largest Pocket(cm)  10.97           24          3.59  RUQ(cm)       RLQ(cm)       LUQ(cm)        LLQ(cm)  2.36          1.7           3.59           3.32 ---------------------------------------------------------------------- Biophysical Evaluation  Amniotic F.V:   Within normal limits       F. Tone:        Observed  F. Movement:    Observed                   N.S.T:          Nonreactive  F. Breathing:   Observed                   Score:  8/10 ---------------------------------------------------------------------- OB History  Blood Type:   A+  Gravidity:    6         Term:   2        Prem:   3  Living:       4 ---------------------------------------------------------------------- Gestational Age  Best:          32w 1d     Det. ByMarcella Dubs         EDD:   06/12/23                                      (11/14/22) ---------------------------------------------------------------------- Targeted Anatomy  Central Nervous System  Calvarium/Cranial V.:  Appears normal          Cereb./Vermis:          Not well visualized  Cavum:                 Not well visualized    Cisterna Magna:         Not well visualized  Lateral Ventricles:    Not well visualized    Midline Falx:           Not well visualized  Choroid Plexus:        Not well visualized  Spine  Cervical:              Not well visualized    Sacral:                 Not well visualized  Thoracic:              Not well visualized    Shape/Curvature:        Not well visualized  Lumbar:                Appears normal  Head/Neck  Lips:                  Previously seen        Profile:                Previously seen  Neck:                  Previously seen        Orbits/Eyes:            Appears normal  Nuchal Fold:           Previously seen        Mandible:               Appears normal  Nasal Bone:            Previously seen        Maxilla:                Appears normal  Thorax  4 Chamber View:        Previously seen        Interventr. Septum:     Previously seen  Cardiac Rhythm:        Normal                 Cardiac Axis:           Previously seen  Cardiac Situs:         Appears normal         Diaphragm:  Appears normal  Rt Outflow Tract:      Previously seen        3 Vessel View:          Previously seen  Lt Outflow Tract:      Previously seen        3 V Trachea View:       Previously seen  Aortic Arch:           Previously seen        IVC:                    Previously seen  Ductal Arch:           Previously seen        Crossing:               Previously seen  SVC:                   Previously seen  Abdomen  Ventral Wall:          Previously seen        Lt Kidney:              Appears normal  Cord Insertion:        Previously seen        Rt Kidney:              Appears normal  Situs:                 Previously seen        Bladder:                Appears normal  Stomach:               Appears normal  Extremities  Lt Humerus:            Previously seen        Lt Femur:               Previously seen  Rt Humerus:             Previously seen        Rt Femur:               Previously seen  Lt Forearm:            Previously seen        Lt Lower Leg:           Previously seen  Rt Forearm:            Previously seen        Rt Lower Leg:           Previously seen  Lt Hand:               Previously seen        Lt Foot:                Previously seen  Rt Hand:               Previously seen        Rt Foot:                Previously seen  Other  Umbilical Cord:        Previously seen        Genitalia:              Female prev seen  Comment:  Technically difficult due to maternal habitus and fetal position. ---------------------------------------------------------------------- Doppler - Fetal Vessels  Umbilical Artery   S/D     %tile      RI    %tile      PI    %tile     PSV    ADFV    RDFV                                                     (cm/s)   3.15       77    0.68       78    1.13       87    35.04      No      No ---------------------------------------------------------------------- Cervix Uterus Adnexa  Cervix  Not visualized (advanced GA >24wks) ---------------------------------------------------------------------- Comments  Jerie Simar is currently at 32 weeks and 3 days.  She  has been followed due to maternal obesity with a BMI of 51  and severe IUGR.  The EFW measured last week was at the  3rd percentile.  She reports that she has been unable to eat  much because she feels nauseous.  She is picking up a  prescription for antinausea medication later today.  She reports feeling fetal movements throughout the day.  A biophysical profile performed today was 8/10.  She had a  nonreactive NST today.  There was normal amniotic fluid noted with a total AFI of  10.97 cm.  Doppler studies of the umbilical arteries performed today  showed a normal S/D ratio of 3.15.  There were no signs of  absent or reversed end-diastolic flow.  Due to severe IUGR and her nonreactive NST today, she will  return in 4 days (next Monday) for another NST.   She will have another BPP and umbilical artery Doppler study  performed next Thursday (in 7 days).  Fetal kick count instructions were reviewed.  The patient stated that all of her questions were answered  today.  A total of 20 minutes was spent counseling and coordinating  the care for this patient.  Greater than 50% of the time was  spent in direct face-to-face contact. ----------------------------------------------------------------------                   Ma Rings, MD Electronically Signed Final Report   04/18/2023 10:34 am ----------------------------------------------------------------------   [place] MFM OB FOLLOW UP Result Date: 04/04/2023 ----------------------------------------------------------------------  OBSTETRICS REPORT                       (Signed Final 04/04/2023 03:59 pm) ---------------------------------------------------------------------- Patient Info  ID #:       [id number]                          D.O.B.:  [birth year] (27 yrs)(F)  Name:       [name]               Visit Date: 04/04/2023 01:26 pm ---------------------------------------------------------------------- Performed By  Attending:        Ma Rings MD         Ref. Address:     [address]  [place], [place]                                                             [address]  Performed By:     Isac Sarna        [facility]                                  [facility]  Referred By:      [facility] ---------------------------------------------------------------------- Orders  #  Description                           Code        Ordered By  1  [place] MFM OB FOLLOW UP                   76816.01    Braxton Feathers  2  [place] MFM UA CORD DOPPLER                 76820.02    BURK SCHAIBLE  3  [place] MFM OB TRANSVAGINAL                13086.5     BURK SCHAIBLE ----------------------------------------------------------------------  #  Order #                     Accession #                Episode #  1  [id number]                   [id number]                 [id number]  2  [id number]                   [id number]                 [id number]  3  [id number]                   [id number]                 [id number] ----------------------------------------------------------------------  Indications  Maternal care for known or suspected poor      O36.5930  fetal growth, third trimester, single or  unspecified fetus IUGR  Hypertension - Chronic/Pre-existing -          O10.019  Nifedipine  Obesity complicating pregnancy, third          O99.213  trimester (BMI 51)  Marginal insertion of umbilical cord affecting O43.193  management of mother in third trimester  Cervical cerclage suture present, third        O34.33  trimester (01/28/23)  History of cesarean delivery, currently        O34.219  pregnant X2  Poor obstetric history: Previous preterm       O09.219  delivery, antepartum ([redacted]w[redacted]d, [redacted]w[redacted]d, 32w)  Poor obstetric history: Previous               O09.299  preeclampsia / eclampsia/gestational HTN  Poor obstetric history: Previous gestational   O09.299  diabetes  [redacted] weeks gestation of pregnancy                Z3A.30 ---------------------------------------------------------------------- Vital Signs                                                 Height:        5'2" ---------------------------------------------------------------------- Fetal Evaluation  Num Of Fetuses:         1  Fetal Heart Rate(bpm):  144  Cardiac Activity:       Observed  Presentation:           Cephalic  Placenta:               Anterior  P. Cord Insertion:      Previously seen  Amniotic Fluid  AFI FV:      Within normal limits  AFI Sum(cm)     %Tile       Largest Pocket(cm)  11.33           24          3.27  RUQ(cm)       RLQ(cm)        LUQ(cm)        LLQ(cm)  2.94          2.79          2.33           3.27 ---------------------------------------------------------------------- Biometry  BPD:      73.6  mm     G. Age:  29w 4d         20  %    CI:        75.55   %    70 - 86                                                          FL/HC:      20.7   %    19.2 - 21.4  HC:      268.5  mm     G. Age:  29w 2d        4.3  %    HC/AC:      1.17  0.99 - 1.21  AC:      229.3  mm     G. Age:  27w 2d        < 1  %    FL/BPD:     75.4   %    71 - 87  FL:       55.5  mm     G. Age:  29w 2d         14  %    FL/AC:      24.2   %    20 - 24  Est. FW:    1205  gm    2 lb 11 oz     2.9  % ---------------------------------------------------------------------- OB History  Blood Type:   A+  Gravidity:    6         Term:   2        Prem:   3  Living:       4 ---------------------------------------------------------------------- Gestational Age  U/S Today:     28w 6d                                        EDD:   06/21/23  Best:          30w 1d     Det. ByMarcella Dubs         EDD:   06/12/23                                      (11/14/22) ---------------------------------------------------------------------- Targeted Anatomy  Central Nervous System  Calvarium/Cranial V.:  Appears normal         Cereb./Vermis:          Not well visualized  Cavum:                 Not well visualized    Cisterna Magna:         Not well visualized  Lateral Ventricles:    Not well visualized    Midline Falx:           Not well visualized  Choroid Plexus:        Not well visualized  Spine  Cervical:              Not well visualized    Sacral:                 Not well visualized  Thoracic:              Not well visualized    Shape/Curvature:        Not well visualized  Lumbar:                Not well visualized  Head/Neck  Lips:                  Previously seen        Profile:                Previously seen  Neck:                  Previously seen        Orbits/Eyes:            Appears  normal  Nuchal Fold:  Previously seen        Mandible:               Appears normal  Nasal Bone:            Previously seen        Maxilla:                Appears normal  Thorax  4 Chamber View:        Previously seen        Interventr. Septum:     Previously seen  Cardiac Rhythm:        Normal                 Cardiac Axis:           Previously seen  Cardiac Situs:         Appears normal         Diaphragm:              Appears normal  Rt Outflow Tract:      Previously seen        3 Vessel View:          Previously seen  Lt Outflow Tract:      Previously seen        3 V Trachea View:       Previously seen  Aortic Arch:           Previously seen        IVC:                    Previously seen  Ductal Arch:           Previously seen        Crossing:               Previously seen  SVC:                   Previously seen  Abdomen  Ventral Wall:          Previously seen        Lt Kidney:              Appears normal  Cord Insertion:        Previously seen        Rt Kidney:              Appears normal  Situs:                 Previously seen        Bladder:                Appears normal  Stomach:               Appears normal  Extremities  Lt Humerus:            Previously seen        Lt Femur:               Previously seen  Rt Humerus:            Previously seen        Rt Femur:               Previously seen  Lt Forearm:            Previously seen        Lt Lower Leg:  Previously seen  Rt Forearm:            Previously seen        Rt Lower Leg:           Previously seen  Lt Hand:               Previously seen        Lt Foot:                Previously seen  Rt Hand:               Previously seen        Rt Foot:                Previously seen  Other  Umbilical Cord:        Previously seen        Genitalia:              Female prev seen ---------------------------------------------------------------------- Doppler - Fetal Vessels  Umbilical Artery   S/D     %tile      RI    %tile      PI    %tile     PSV    ADFV     RDFV                                                     (cm/s)   5.44   > 97.5    0.82   > 97.5    1.52   > 97.5    49.64      No      No ---------------------------------------------------------------------- Comments  This patient was seen due to IUGR.  She denies any  problems since her last exam and reports feeling vigorous  fetal movements throughout the day.  On today's exam, the EFW of 2 pounds 11 ounces measures  at the third percentile for her gestational age indicating IUGR.  The fetus has grown 1 pound over the past 3 weeks.  The total AFI was 11.33 cm (within normal limits).  She had a reactive NST for her gestational age today.  Doppler studies of the umbilical arteries showed an elevated  S/D ratio of 5.44.  There were no signs of absent or reversed  end-diastolic flow.  As the fetal intracranial anatomy has been unable to be fully  visualized, a transvaginal ultrasound was performed to  assess the intracranial anatomy.  There were no obvious  intracranial anomalies noted.  Due to fetal growth restriction, we will continue to follow her  with weekly fetal testing and umbilical artery Doppler studies.  We will reassess the fetal growth again in 3 weeks.  She will return in 1 week for another NST and umbilical artery  Doppler study. ----------------------------------------------------------------------                   Ma Rings, MD Electronically Signed Final Report   04/04/2023 03:59 pm ----------------------------------------------------------------------   [place] MFM UA CORD DOPPLER Result Date: 04/04/2023 ----------------------------------------------------------------------  OBSTETRICS REPORT                       (Signed Final 04/04/2023 03:59 pm) ---------------------------------------------------------------------- Patient Info  ID #:       [id number]  D.O.B.:  06/13/1995 (27 yrs)(F)  Name:       [name]               Visit Date: 04/04/2023 01:26 pm  ---------------------------------------------------------------------- Performed By  Attending:        Ma Rings MD         Ref. Address:     [address]                                                             [place], [place]                                                             [address]  Performed By:     Isac Sarna        [facility]                                  [facility]  Referred By:      [facility] ---------------------------------------------------------------------- Orders  #  Description                           Code        Ordered By  1  [place] MFM OB FOLLOW UP                   76816.01    Braxton Feathers  2  [place] MFM UA CORD DOPPLER                76820.02    Braxton Feathers  3  [place] MFM OB TRANSVAGINAL                60454.0     Braxton Feathers ----------------------------------------------------------------------  #  Order #                     Accession #  Episode #  1  [id number]                   [id number]                 [id number]  2  [id number]                   [id number]                 [id number]  3  [id number]                   [id number]                 [id number] ---------------------------------------------------------------------- Indications  Maternal care for known or suspected poor      O36.5930  fetal growth, third trimester, single or  unspecified fetus IUGR  Hypertension - Chronic/Pre-existing -          O10.019  Nifedipine  Obesity complicating pregnancy, third          O99.213  trimester (BMI 51)  Marginal insertion of umbilical cord affecting O43.193  management of mother in third trimester  Cervical cerclage suture present, third        O34.33  trimester (01/28/23)  History of cesarean delivery, currently        O34.219  pregnant X2  Poor  obstetric history: Previous preterm       O09.219  delivery, antepartum ([redacted]w[redacted]d, [redacted]w[redacted]d, 32w)  Poor obstetric history: Previous               O09.299  preeclampsia / eclampsia/gestational HTN  Poor obstetric history: Previous gestational   O09.299  diabetes  [redacted] weeks gestation of pregnancy                Z3A.30 ---------------------------------------------------------------------- Vital Signs                                                 Height:        5'2" ---------------------------------------------------------------------- Fetal Evaluation  Num Of Fetuses:         1  Fetal Heart Rate(bpm):  144  Cardiac Activity:       Observed  Presentation:           Cephalic  Placenta:               Anterior  P. Cord Insertion:      Previously seen  Amniotic Fluid  AFI FV:      Within normal limits  AFI Sum(cm)     %Tile       Largest Pocket(cm)  11.33           24          3.27  RUQ(cm)       RLQ(cm)       LUQ(cm)        LLQ(cm)  2.94          2.79          2.33           3.27 ---------------------------------------------------------------------- Biometry  BPD:      73.6  mm     G. Age:  29w 4d         20  %    CI:        75.55   %  70 - 86                                                          FL/HC:      20.7   %    19.2 - 21.4  HC:      268.5  mm     G. Age:  29w 2d        4.3  %    HC/AC:      1.17        0.99 - 1.21  AC:      229.3  mm     G. Age:  27w 2d        < 1  %    FL/BPD:     75.4   %    71 - 87  FL:       55.5  mm     G. Age:  29w 2d         14  %    FL/AC:      24.2   %    20 - 24  Est. FW:    1205  gm    2 lb 11 oz     2.9  % ---------------------------------------------------------------------- OB History  Blood Type:   A+  Gravidity:    6         Term:   2        Prem:   3  Living:       4 ---------------------------------------------------------------------- Gestational Age  U/S Today:     28w 6d                                        EDD:   06/21/23  Best:          30w 1d     Det. ByMarcella Dubs          EDD:   06/12/23                                      (11/14/22) ---------------------------------------------------------------------- Targeted Anatomy  Central Nervous System  Calvarium/Cranial V.:  Appears normal         Cereb./Vermis:          Not well visualized  Cavum:                 Not well visualized    Cisterna Magna:         Not well visualized  Lateral Ventricles:    Not well visualized    Midline Falx:           Not well visualized  Choroid Plexus:        Not well visualized  Spine  Cervical:              Not well visualized    Sacral:                 Not well visualized  Thoracic:              Not well visualized    Shape/Curvature:  Not well visualized  Lumbar:                Not well visualized  Head/Neck  Lips:                  Previously seen        Profile:                Previously seen  Neck:                  Previously seen        Orbits/Eyes:            Appears normal  Nuchal Fold:           Previously seen        Mandible:               Appears normal  Nasal Bone:            Previously seen        Maxilla:                Appears normal  Thorax  4 Chamber View:        Previously seen        Interventr. Septum:     Previously seen  Cardiac Rhythm:        Normal                 Cardiac Axis:           Previously seen  Cardiac Situs:         Appears normal         Diaphragm:              Appears normal  Rt Outflow Tract:      Previously seen        3 Vessel View:          Previously seen  Lt Outflow Tract:      Previously seen        3 V Trachea View:       Previously seen  Aortic Arch:           Previously seen        IVC:                    Previously seen  Ductal Arch:           Previously seen        Crossing:               Previously seen  SVC:                   Previously seen  Abdomen  Ventral Wall:          Previously seen        Lt Kidney:              Appears normal  Cord Insertion:        Previously seen        Rt Kidney:              Appears normal  Situs:                  Previously seen        Bladder:                Appears normal  Stomach:  Appears normal  Extremities  Lt Humerus:            Previously seen        Lt Femur:               Previously seen  Rt Humerus:            Previously seen        Rt Femur:               Previously seen  Lt Forearm:            Previously seen        Lt Lower Leg:           Previously seen  Rt Forearm:            Previously seen        Rt Lower Leg:           Previously seen  Lt Hand:               Previously seen        Lt Foot:                Previously seen  Rt Hand:               Previously seen        Rt Foot:                Previously seen  Other  Umbilical Cord:        Previously seen        Genitalia:              Female prev seen ---------------------------------------------------------------------- Doppler - Fetal Vessels  Umbilical Artery   S/D     %tile      RI    %tile      PI    %tile     PSV    ADFV    RDFV                                                     (cm/s)   5.44   > 97.5    0.82   > 97.5    1.52   > 97.5    49.64      No      No ---------------------------------------------------------------------- Comments  This patient was seen due to IUGR.  She denies any  problems since her last exam and reports feeling vigorous  fetal movements throughout the day.  On today's exam, the EFW of 2 pounds 11 ounces measures  at the third percentile for her gestational age indicating IUGR.  The fetus has grown 1 pound over the past 3 weeks.  The total AFI was 11.33 cm (within normal limits).  She had a reactive NST for her gestational age today.  Doppler studies of the umbilical arteries showed an elevated  S/D ratio of 5.44.  There were no signs of absent or reversed  end-diastolic flow.  As the fetal intracranial anatomy has been unable to be fully  visualized, a transvaginal ultrasound was performed to  assess the intracranial anatomy.  There were no obvious  intracranial anomalies noted.  Due to fetal growth restriction, we  will continue to follow her  with weekly fetal testing and umbilical artery Doppler  studies.  We will reassess the fetal growth again in 3 weeks.  She will return in 1 week for another NST and umbilical artery  Doppler study. ----------------------------------------------------------------------                   Ma Rings, MD Electronically Signed Final Report   04/04/2023 03:59 pm ----------------------------------------------------------------------   [place] MFM OB Transvaginal Result Date: 04/04/2023 ----------------------------------------------------------------------  OBSTETRICS REPORT                       (Signed Final 04/04/2023 03:59 pm) ---------------------------------------------------------------------- Patient Info  ID #:       [id number]                          D.O.B.:  08/11/1995 (27 yrs)(F)  Name:       [name]               Visit Date: 04/04/2023 01:26 pm ---------------------------------------------------------------------- Performed By  Attending:        Ma Rings MD         Ref. Address:     [address]                                                             [place], [place]                                                             [address]  Performed By:     Isac Sarna        Location[facility]                                  [facility]  Referred By:      [facility] ---------------------------------------------------------------------- Orders  #  Description                           Code  Ordered By  1  [place] MFM OB FOLLOW UP                   76816.01    Braxton Feathers  2  [place] MFM UA CORD DOPPLER                76820.02    BURK SCHAIBLE  3  [place] MFM OB TRANSVAGINAL                29562.1     [facility]  ----------------------------------------------------------------------  #  Order #                     Accession #                Episode #  1  [id number]                   [id number]                 [id number]  2  [id number]                   [id number]                 [id number]  3  [id number]                   [id number]                 [id number] ---------------------------------------------------------------------- Indications  Maternal care for known or suspected poor      O36.5930  fetal growth, third trimester, single or  unspecified fetus IUGR  Hypertension - Chronic/Pre-existing -          O10.019  Nifedipine  Obesity complicating pregnancy, third          O99.213  trimester (BMI 51)  Marginal insertion of umbilical cord affecting O43.193  management of mother in third trimester  Cervical cerclage suture present, third        O34.33  trimester (01/28/23)  History of cesarean delivery, currently        O34.219  pregnant X2  Poor obstetric history: Previous preterm       O09.219  delivery, antepartum ([redacted]w[redacted]d, [redacted]w[redacted]d, 32w)  Poor obstetric history: Previous               O09.299  preeclampsia / eclampsia/gestational HTN  Poor obstetric history: Previous gestational   O09.299  diabetes  [redacted] weeks gestation of pregnancy                Z3A.30 ---------------------------------------------------------------------- Vital Signs                                                 Height:        5'2" ---------------------------------------------------------------------- Fetal Evaluation  Num Of Fetuses:         1  Fetal Heart Rate(bpm):  144  Cardiac Activity:       Observed  Presentation:           Cephalic  Placenta:               Anterior  P. Cord Insertion:      Previously seen  Amniotic Fluid  AFI FV:      Within normal limits  AFI Sum(cm)     %  Tile       Largest Pocket(cm)  11.33           24          3.27  RUQ(cm)       RLQ(cm)       LUQ(cm)        LLQ(cm)  2.94          2.79          2.33           3.27  ---------------------------------------------------------------------- Biometry  BPD:      73.6  mm     G. Age:  29w 4d         20  %    CI:        75.55   %    70 - 86                                                          FL/HC:      20.7   %    19.2 - 21.4  HC:      268.5  mm     G. Age:  29w 2d        4.3  %    HC/AC:      1.17        0.99 - 1.21  AC:      229.3  mm     G. Age:  27w 2d        < 1  %    FL/BPD:     75.4   %    71 - 87  FL:       55.5  mm     G. Age:  29w 2d         14  %    FL/AC:      24.2   %    20 - 24  Est. FW:    1205  gm    2 lb 11 oz     2.9  % ---------------------------------------------------------------------- OB History  Blood Type:   A+  Gravidity:    6         Term:   2        Prem:   3  Living:       4 ---------------------------------------------------------------------- Gestational Age  U/S Today:     28w 6d                                        EDD:   06/21/23  Best:          30w 1d     Det. ByMarcella Dubs         EDD:   06/12/23                                      (11/14/22) ---------------------------------------------------------------------- Targeted Anatomy  Central Nervous System  Calvarium/Cranial V.:  Appears normal         Cereb./Vermis:          Not well visualized  Cavum:  Not well visualized    Cisterna Magna:         Not well visualized  Lateral Ventricles:    Not well visualized    Midline Falx:           Not well visualized  Choroid Plexus:        Not well visualized  Spine  Cervical:              Not well visualized    Sacral:                 Not well visualized  Thoracic:              Not well visualized    Shape/Curvature:        Not well visualized  Lumbar:                Not well visualized  Head/Neck  Lips:                  Previously seen        Profile:                Previously seen  Neck:                  Previously seen        Orbits/Eyes:            Appears normal  Nuchal Fold:           Previously seen        Mandible:                Appears normal  Nasal Bone:            Previously seen        Maxilla:                Appears normal  Thorax  4 Chamber View:        Previously seen        Interventr. Septum:     Previously seen  Cardiac Rhythm:        Normal                 Cardiac Axis:           Previously seen  Cardiac Situs:         Appears normal         Diaphragm:              Appears normal  Rt Outflow Tract:      Previously seen        3 Vessel View:          Previously seen  Lt Outflow Tract:      Previously seen        3 V Trachea View:       Previously seen  Aortic Arch:           Previously seen        IVC:                    Previously seen  Ductal Arch:           Previously seen        Crossing:               Previously seen  SVC:                   Previously seen  Abdomen  Ventral Wall:          Previously seen        Lt Kidney:              Appears normal  Cord Insertion:        Previously seen        Rt Kidney:              Appears normal  Situs:                 Previously seen        Bladder:                Appears normal  Stomach:               Appears normal  Extremities  Lt Humerus:            Previously seen        Lt Femur:               Previously seen  Rt Humerus:            Previously seen        Rt Femur:               Previously seen  Lt Forearm:            Previously seen        Lt Lower Leg:           Previously seen  Rt Forearm:            Previously seen        Rt Lower Leg:           Previously seen  Lt Hand:               Previously seen        Lt Foot:                Previously seen  Rt Hand:               Previously seen        Rt Foot:                Previously seen  Other  Umbilical Cord:        Previously seen        Genitalia:              Female prev seen ---------------------------------------------------------------------- Doppler - Fetal Vessels  Umbilical Artery   S/D     %tile      RI    %tile      PI    %tile     PSV    ADFV    RDFV                                                     (cm/s)   5.44    > 97.5    0.82   > 97.5    1.52   > 97.5    49.64      No      No ---------------------------------------------------------------------- Comments  This patient was seen due to IUGR.  She denies any  problems since her last exam and reports feeling vigorous  fetal movements throughout the day.  On  today's exam, the EFW of 2 pounds 11 ounces measures  at the third percentile for her gestational age indicating IUGR.  The fetus has grown 1 pound over the past 3 weeks.  The total AFI was 11.33 cm (within normal limits).  She had a reactive NST for her gestational age today.  Doppler studies of the umbilical arteries showed an elevated  S/D ratio of 5.44.  There were no signs of absent or reversed  end-diastolic flow.  As the fetal intracranial anatomy has been unable to be fully  visualized, a transvaginal ultrasound was performed to  assess the intracranial anatomy.  There were no obvious  intracranial anomalies noted.  Due to fetal growth restriction, we will continue to follow her  with weekly fetal testing and umbilical artery Doppler studies.  We will reassess the fetal growth again in 3 weeks.  She will return in 1 week for another NST and umbilical artery  Doppler study. ----------------------------------------------------------------------                   Ma Rings, MD Electronically Signed Final Report   04/04/2023 03:59 pm ----------------------------------------------------------------------   [place] MFM UA CORD DOPPLER Result Date: 03/28/2023 ----------------------------------------------------------------------  OBSTETRICS REPORT                       (Signed Final 03/28/2023 03:46 pm) ---------------------------------------------------------------------- Patient Info  ID #:       [id number]                          D.O.B.:  [birth year] (27 yrs)(F)  Name:       [name]               Visit Date: 03/28/2023 02:20 pm ---------------------------------------------------------------------- Performed By   Attending:        Ma Rings MD         Ref. Address:     [address]                                                             [place], [place]                                                             [address]  Performed By:     Marcellina Millin       [facility]                                     [facility]  Referred By:      [facility] ---------------------------------------------------------------------- Orders  #  Description                           Code        Ordered By  1  [place] MFM UA CORD DOPPLER                76820.02    Braxton Feathers  2  [place] MFM OB LIMITED                     [phone]    Braxton Feathers ----------------------------------------------------------------------  #  Order #                     Accession #                Episode #  1  [id number]                   [id number]                 [id number]  2  [id number]                   [id number]                 [id number] ---------------------------------------------------------------------- Indications  Maternal care for known or suspected poor      O36.5930  fetal growth, third trimester, single or  unspecified fetus IUGR  Hypertension - Chronic/Pre-existing -          O10.019  Nifedipine  Obesity complicating pregnancy, third          O99.213  trimester (BMI 51)  Marginal insertion of umbilical cord affecting O43.193  management of mother in third trimester  Cervical cerclage suture present, third        O34.33  trimester (01/28/23)  History of cesarean delivery, currently        O34.219  pregnant X2  Poor obstetric history: Previous preterm       O09.219  delivery, antepartum ([redacted]w[redacted]d, [redacted]w[redacted]d, 32w)  Poor obstetric history: Previous               O09.299  preeclampsia / eclampsia/gestational HTN  Poor obstetric history:  Previous gestational   O09.299  diabetes  [redacted] weeks gestation of pregnancy                Z3A.29 ---------------------------------------------------------------------- Vital Signs                                                 Height:        5'2" ---------------------------------------------------------------------- Fetal Evaluation  Num Of Fetuses:         1  Fetal Heart Rate(bpm):  145  Cardiac Activity:  Observed  Presentation:           Cephalic  Placenta:               Anterior  P. Cord Insertion:      Marg insertion previously seen  Amniotic Fluid  AFI FV:      Within normal limits  AFI Sum(cm)     %Tile       Largest Pocket(cm)  11.12           21          3.63  RUQ(cm)       RLQ(cm)       LUQ(cm)        LLQ(cm)  3.22          3.63          1.88           2.39 ---------------------------------------------------------------------- OB History  Blood Type:   A+  Gravidity:    6         Term:   2        Prem:   3  Living:       4 ---------------------------------------------------------------------- Gestational Age  Best:          29w 1d     Det. By:  Marcella Dubs         EDD:   06/12/23                                      (11/14/22) ---------------------------------------------------------------------- Doppler - Fetal Vessels  Umbilical Artery   S/D     %tile      RI    %tile      PI    %tile     PSV    ADFV    RDFV                                                     (cm/s)    4.1       93    0.76       93     1.3       94    45.13      No      No ---------------------------------------------------------------------- Comments  This patient was seen due to an IUGR fetus.  She denies any  problems since her last exam.  She reports feeling vigorous  fetal movements throughout the day.  She had a reactive NST for her gestational age today.  The total AFI was 11.12 cm (within normal limits).  Doppler studies of the umbilical arteries performed due to  fetal growth restriction showed an elevated S/D ratio of  4.1.  There were no signs of absent or reversed end-diastolic flow  noted today.  Due to severe IUGR, we will continue to follow her with twice-  weekly fetal testing.  She will return next Monday for an NST and next Thursday for  another growth scan, NST, and umbilical artery Doppler study. ----------------------------------------------------------------------                  Ma Rings, MD Electronically Signed Final Report   03/28/2023 03:46 pm ----------------------------------------------------------------------   [place] MFM OB LIMITED Result Date: 03/28/2023 ----------------------------------------------------------------------  OBSTETRICS REPORT                       (Signed Final 03/28/2023 03:46 pm) ---------------------------------------------------------------------- Patient Info  ID #:       [id number]                          D.O.B.:  18-Dec-1995 (27 yrs)(F)  Name:       [name]               Visit Date: 03/28/2023 02:20 pm ---------------------------------------------------------------------- Performed By  Attending:        Ma Rings MD         Ref. Address:     [address]                                                             [place], [place]                                                             [address]  Performed By:     Marcellina Millin       [facility]  Referred By:      [facility] ---------------------------------------------------------------------- Orders  #  Description                           Code        Ordered By  1  [place] MFM UA CORD DOPPLER                76820.02    Braxton Feathers  2  [place] MFM OB LIMITED                     19147.82    BURK SCHAIBLE  ----------------------------------------------------------------------  #  Order #                     Accession #                Episode #  1  [id number]                   [id number]                 [id number]  2  [id number]                   [id number]                 [id number] ---------------------------------------------------------------------- Indications  Maternal care for known or suspected poor      O36.5930  fetal growth, third trimester, single or  unspecified fetus IUGR  Hypertension - Chronic/Pre-existing -          O10.019  Nifedipine  Obesity complicating pregnancy, third          O99.213  trimester (BMI 51)  Marginal insertion of umbilical cord affecting O43.193  management of mother in third trimester  Cervical cerclage suture present, third        O34.33  trimester (01/28/23)  History of cesarean delivery, currently        O34.219  pregnant X2  Poor obstetric history: Previous preterm       O09.219  delivery, antepartum ([redacted]w[redacted]d, [redacted]w[redacted]d, 32w)  Poor obstetric history: Previous               O09.299  preeclampsia / eclampsia/gestational HTN  Poor obstetric history: Previous gestational   O09.299  diabetes  [redacted] weeks gestation of pregnancy                Z3A.29 ---------------------------------------------------------------------- Vital Signs                                                 Height:        5'2" ---------------------------------------------------------------------- Fetal Evaluation  Num Of Fetuses:         1  Fetal Heart Rate(bpm):  145  Cardiac Activity:       Observed  Presentation:           Cephalic  Placenta:               Anterior  P. Cord Insertion:      Marg insertion previously seen  Amniotic Fluid  AFI FV:      Within normal limits  AFI Sum(cm)     %Tile       Largest Pocket(cm)  11.12           21          3.63  RUQ(cm)       RLQ(cm)       LUQ(cm)        LLQ(cm)  3.22          3.63          1.88           2.39 ---------------------------------------------------------------------- OB  History  Blood Type:   A+  Gravidity:    6         Term:   2        Prem:   3  Living:  4 ---------------------------------------------------------------------- Gestational Age  Best:          29w 1d     Det. By:  Marcella Dubs         EDD:   06/12/23                                      (11/14/22) ---------------------------------------------------------------------- Doppler - Fetal Vessels  Umbilical Artery   S/D     %tile      RI    %tile      PI    %tile     PSV    ADFV    RDFV                                                     (cm/s)    4.1       93    0.76       93     1.3       94    45.13      No      No ---------------------------------------------------------------------- Comments  This patient was seen due to an IUGR fetus.  She denies any  problems since her last exam.  She reports feeling vigorous  fetal movements throughout the day.  She had a reactive NST for her gestational age today.  The total AFI was 11.12 cm (within normal limits).  Doppler studies of the umbilical arteries performed due to  fetal growth restriction showed an elevated S/D ratio of 4.1.  There were no signs of absent or reversed end-diastolic flow  noted today.  Due to severe IUGR, we will continue to follow her with twice-  weekly fetal testing.  She will return next Monday for an NST and next Thursday for  another growth scan, NST, and umbilical artery Doppler study. ----------------------------------------------------------------------                  Ma Rings, MD Electronically Signed Final Report   03/28/2023 03:46 pm ----------------------------------------------------------------------     Assessment & Plan   27 y.o. Z6X0960 at [redacted]w[redacted]d admitted after second episode of bleeding, severe range BP, BPP 6/10 *Pregnancy: Ordered for NST tid, will repeat BPP in AM. *VB: Admit to inpatient.  Discussed with her if bleeding comes back and/or worsens, and/or decline in fetal status, patient would need delivery via  c-section *Cerclage: no current indication for removal *Preterm: Second BMZ ordered. NICU consulted. *History of c-sections:patient desires RCS and BTL *CHTN: Stable BP since arrival, continue on home Procardia XL 30 mg daily for now. Follow up labs. *H/o HSV: Continue ppx valtrex tomorrow, no current symptoms.  *FEN/GI: Regular diet *PPx: SCDs.  *Analgesia: no current needs *Dispo: pending bleeding, BP and FWB status    Jaynie Collins, MD Attending [facility])

## 2023-04-25 NOTE — Progress Notes (Signed)
 MFM Consult Note  Margaret Ingram is currently at 33 weeks and 2 days.  She has been followed due to maternal obesity with a BMI of 51 and severe IUGR.  The patient was seen in the MAU last night due to vaginal bleeding.  Admission to the hospital was recommended.  However, she signed out AGAINST MEDICAL ADVICE.  She did receive the first dose of betamethasone prior to signing out.    The patient's blood pressure today was 160/98.  The overall EFW obtained today was 3 pounds 8 ounces (2nd percentile for her gestational age indicating severe IUGR).  A biophysical profile performed today was 4 out of 8.  She received a -2 for absent fetal movements and another -2 absent fetal tone.  There was normal amniotic fluid noted with a total AFI of 11.79 cm.    The fetus was in the vertex presentation.  Doppler studies of the umbilical arteries performed today showed a normal S/D ratio of 3.37.  There were no signs of absent or reversed end-diastolic flow.  I discussed her case with Dr. Crissie Reese.    Due to severe IUGR, her elevated blood pressures, the abnormal fetal testing today, and her vaginal bleeding, I recommend that the patient be admitted to the hospital for observation and be worked up to determine if she has preeclampsia.  She should receive the second dose of betamethasone this evening.  Should she be diagnosed with preeclampsia, delivery is recommended at around 34 weeks.  She should continue inpatient management until delivery.  Should preeclampsia be ruled out, inpatient versus outpatient management will depend on her fetal testing and the amount of vaginal bleeding she is experiencing.  A total of 30 minutes was spent counseling and coordinating the care for this patient.  Greater than 50% of the time was spent in direct face-to-face contact.

## 2023-04-25 NOTE — Progress Notes (Signed)
 Subjective:  Margaret Ingram is a 28 y.o. Z6X0960 at [redacted]w[redacted]d being seen today for ongoing prenatal care.  She is currently monitored for the following issues for this high-risk pregnancy and has History of gestational diabetes mellitus (GDM); History of severe pre-eclampsia; Chronic hypertension; Depression; History of preterm labor, current pregnancy, third trimester; Chronic hypertension in pregnancy; Supervision of high risk pregnancy, antepartum; Vaginal bleeding during pregnancy; History of incompetent cervix, currently pregnant in third trimester; Cervical cerclage suture present in third trimester; BMI 45.0-49.9, adult (HCC); Obesity in pregnancy; HSV (herpes simplex virus) infection; IUGR (intrauterine growth restriction) affecting care of mother, third trimester; Unwanted fertility; and History of C-section on their problem list.  Patient reports bleeding.  Contractions: Not present. Vag. Bleeding: Scant (brown).  Movement: Present. Denies leaking of fluid.   The following portions of the patient's history were reviewed and updated as appropriate: allergies, current medications, past family history, past medical history, past social history, past surgical history and problem list. Problem list updated.  Objective:   Vitals:   04/25/23 1042 04/25/23 1102  BP: (!) 170/97 (!) 159/98  Pulse: 90   Weight: 279 lb (126.6 kg)     Fetal Status: Fetal Heart Rate (bpm): NST   Movement: Present     General:  Alert, oriented and cooperative. Patient is in no acute distress.  Skin: Skin is warm and dry. No rash noted.   Cardiovascular: Normal heart rate noted  Respiratory: Normal respiratory effort, no problems with respiration noted  Abdomen: Soft, gravid, appropriate for gestational age. Pain/Pressure: Absent     Pelvic: Vag. Bleeding: Scant (brown)     Cervical exam deferred        Extremities: Normal range of motion.  Edema: Mild pitting, slight indentation  Mental Status: Normal mood  and affect. Normal behavior. Normal judgment and thought content.   Urinalysis:      Assessment and Plan:  Pregnancy: [id number] at [redacted]w[redacted]d  1. Supervision of high risk pregnancy, antepartum (Primary) BP initially severe range, repeat BP right on the edge FHR assessed with NST reactive and with moderate variability On review of chart patient was seen late last night in MAU for bleeding, recommended to be admitted for obs but declined stating she had appts today, was given first dose of betamethasone in MAU prior to discharge Discussed with patient given other findings (see below) I recommend proceeding to hospital for PreE evaluation and likely admission Needs GTT, will notify providers at hospital Spoke with Dr. Macon Large who after review of clinical situation recommends direct admission  2. IUGR (intrauterine growth restriction) affecting care of mother, third trimester Repeat growth US done today, final read not yet back but images and calculations reviewed in PACS EFW 2%, 1590g, AC 0%, AFI 11.7, dopplers do not appear to show evidence of absent or reversed end diastolic flow and average S/D ratio is borderline at 88% Discussed with Dr. Parke Poisson who is in agreement with patient returning to the hospital for evaluation and likely admission No indication to delivery urgently at present but strong possibility she does not have long left in this pregnancy  3. HSV (herpes simplex virus) infection On valtrex  4. History of severe pre-eclampsia On ASA Likely developing it again Says she feels a headache coming on  5. History of incompetent cervix, currently pregnant in third trimester Seen for bleeding, has cerclage in place (done at [facility])  6. History of gestational diabetes mellitus (GDM)   7. History of C-section Plan for RCS+BTL  8. Unwanted fertility Consent signed 04/04/2023  9. Chronic hypertension in pregnancy On Nifed 30 XL, normotensive up to now, reports she is starting to get a  headache and is also feeling less fetal movement  10. Obesity in pregnancy   11. Depression, unspecified depression type Not addressed  Preterm labor symptoms and general obstetric precautions including but not limited to vaginal bleeding, contractions, leaking of fluid and fetal movement were reviewed in detail with the patient. Please refer to After Visit Summary for other counseling recommendations.  Return in 2 weeks (on 05/09/2023) for [facility], ob visit.   Venora Maples, MD

## 2023-04-25 NOTE — Progress Notes (Signed)
 Recommended patient be admitted for BMZ and monitoring given 2nd episode of vaginal bleeding in a pregnancy complicated by [facility], cerclage, prior PTB, history of c-section x2, and FGR.   Patient was initially agreeable but ultimately decided against admission and wished to leave AMA. Margaret Ingram discussed risks with her and papers signed.   She reports she will return if additional bleeding. She has [place] with MFM this morning.   Margaret Hock, MD Attending Obstetrician & Gynecologist, [facility] for [facility], [facility]

## 2023-04-25 NOTE — MAU Note (Signed)
 Pt signed AMA form due to not wanting to be admitted to [facility]. Pt stated that being in [facility] is not good for her mental health and has a doctors appointment in the morning.   Milas Hock, MD and Wynelle Bourgeois, CNM voiced their concerns and explained to patient the risk and Benefits.   Pt agreed to get her 1st Betamethasone shot.   Pt states she understands the risk and benefits. Pt states that she goes to the high risk OB twice a week to be evaluated.

## 2023-04-26 ENCOUNTER — Inpatient Hospital Stay (HOSPITAL_COMMUNITY): Payer: MEDICAID

## 2023-04-26 DIAGNOSIS — O113 Pre-existing hypertension with pre-eclampsia, third trimester: Secondary | ICD-10-CM | POA: Diagnosis not present

## 2023-04-26 DIAGNOSIS — O4693 Antepartum hemorrhage, unspecified, third trimester: Secondary | ICD-10-CM

## 2023-04-26 DIAGNOSIS — Z3A33 33 weeks gestation of pregnancy: Secondary | ICD-10-CM | POA: Diagnosis not present

## 2023-04-26 DIAGNOSIS — E669 Obesity, unspecified: Secondary | ICD-10-CM

## 2023-04-26 DIAGNOSIS — O99213 Obesity complicating pregnancy, third trimester: Secondary | ICD-10-CM

## 2023-04-26 DIAGNOSIS — O10013 Pre-existing essential hypertension complicating pregnancy, third trimester: Secondary | ICD-10-CM

## 2023-04-26 DIAGNOSIS — O365930 Maternal care for other known or suspected poor fetal growth, third trimester, not applicable or unspecified: Secondary | ICD-10-CM

## 2023-04-26 DIAGNOSIS — O3433 Maternal care for cervical incompetence, third trimester: Secondary | ICD-10-CM | POA: Diagnosis not present

## 2023-04-26 DIAGNOSIS — O09213 Supervision of pregnancy with history of pre-term labor, third trimester: Secondary | ICD-10-CM

## 2023-04-26 LAB — CBC
HCT: 32.2 % — ABNORMAL LOW (ref 36.0–46.0)
Hemoglobin: 10.8 g/dL — ABNORMAL LOW (ref 12.0–15.0)
MCH: 29 pg (ref 26.0–34.0)
MCHC: 33.5 g/dL (ref 30.0–36.0)
MCV: 86.6 fL (ref 80.0–100.0)
Platelets: 138 10*3/uL — ABNORMAL LOW (ref 150–400)
RBC: 3.72 MIL/uL — ABNORMAL LOW (ref 3.87–5.11)
RDW: 12.9 % (ref 11.5–15.5)
WBC: 7.8 10*3/uL (ref 4.0–10.5)
nRBC: 0.4 % — ABNORMAL HIGH (ref 0.0–0.2)

## 2023-04-26 LAB — COMPREHENSIVE METABOLIC PANEL
ALT: 12 U/L (ref 0–44)
AST: 13 U/L — ABNORMAL LOW (ref 15–41)
Albumin: 2.9 g/dL — ABNORMAL LOW (ref 3.5–5.0)
Alkaline Phosphatase: 69 U/L (ref 38–126)
Anion gap: 8 (ref 5–15)
BUN: 7 mg/dL (ref 6–20)
CO2: 19 mmol/L — ABNORMAL LOW (ref 22–32)
Calcium: 8.7 mg/dL — ABNORMAL LOW (ref 8.9–10.3)
Chloride: 110 mmol/L (ref 98–111)
Creatinine, Ser: 0.55 mg/dL (ref 0.44–1.00)
GFR, Estimated: >60 mL/min (ref >60)
Glucose, Bld: 134 mg/dL — ABNORMAL HIGH (ref 70–99)
Potassium: 3.5 mmol/L (ref 3.5–5.1)
Sodium: 137 mmol/L (ref 135–145)
Total Bilirubin: 0.5 mg/dL (ref 0.0–1.2)
Total Protein: 6.4 g/dL — ABNORMAL LOW (ref 6.5–8.1)

## 2023-04-26 LAB — PROTEIN / CREATININE RATIO, URINE
Creatinine, Urine: 269 mg/dL
Protein Creatinine Ratio: 0.07 mg/mg{creat} (ref 0.00–0.15)
Total Protein, Urine: 18 mg/dL

## 2023-04-26 LAB — RAPID URINE DRUG SCREEN, HOSP PERFORMED
Amphetamines: NOT DETECTED
Barbiturates: NOT DETECTED
Benzodiazepines: NOT DETECTED
Cocaine: NOT DETECTED
Opiates: NOT DETECTED
Tetrahydrocannabinol: NOT DETECTED

## 2023-04-26 MED ORDER — NIFEDIPINE ER OSMOTIC RELEASE 60 MG PO TB24
60.0000 mg | ORAL_TABLET | Freq: Every day | ORAL | Status: DC
  Administered 2023-04-27: 60 mg via ORAL
  Filled 2023-04-26: qty 1

## 2023-04-26 NOTE — Progress Notes (Signed)
 FACULTY PRACTICE ANTEPARTUM COMPREHENSIVE PROGRESS NOTE  Margaret Ingram is a 28 y.o. [id number] at [redacted]w[redacted]d with hx prior CS x2, cerclage in place and FGR (1.5%, nml UAD) who is admitted for vaginal bleeding, BPP 6/10, and worsening cHTN .  Estimated Date of Delivery: 06/11/23 Fetal presentation is cephalic.  Length of Stay:  1 Days. Admitted 04/25/2023  Subjective: No complaints this morning. She is feeling well. Bleeding has resolved. She denies contractions, LOF. +FM. Denies HA, visual changes, CP/SOB, RUQ/epigastric pain  Vitals:  Blood pressure (!) 149/91, pulse 77, temperature 97.7 F (36.5 C), temperature source Axillary, resp. rate 16, height 5\' 2"  (1.575 m), weight 126.1 kg, last menstrual period 09/04/2022, SpO2 99%. Physical Examination: CONSTITUTIONAL: No acute distress, well appearing CARDIOVASCULAR: Normal heart rate noted, RESPIRATORY: Effort normal, no problems with respiration noted ABDOMEN: Soft, nontender, nondistended, gravid.  BPP 8/8 this morning  Results for orders placed or performed during the hospital encounter of 04/25/23 (from the past 48 hours)  Type and screen [facility]     Status: None   Collection Time: 04/25/23  5:28 PM  Result Value Ref Range   ABO/RH(D) A POS    Antibody Screen NEG    Sample Expiration      04/28/2023,2359 Performed at [facility], [address], [place], [place] [address]   CBC     Status: Abnormal   Collection Time: 04/25/23  9:07 PM  Result Value Ref Range   WBC 8.0 4.0 - 10.5 K/uL   RBC 3.50 (L) 3.87 - 5.11 MIL/uL   Hemoglobin 10.3 (L) 12.0 - 15.0 g/dL   HCT 81.1 (L) 91.4 - 78.2 %   MCV 86.6 80.0 - 100.0 fL   MCH 29.4 26.0 - 34.0 pg   MCHC 34.0 30.0 - 36.0 g/dL   RDW 95.6 21.3 - 08.6 %   Platelets 144 (L) 150 - 400 K/uL   nRBC 0.0 0.0 - 0.2 %    Comment: Performed at [facility], [address], [place], [place] [address]  Comprehensive metabolic panel     Status: Abnormal   Collection  Time: 04/25/23  9:07 PM  Result Value Ref Range   Sodium 138 135 - 145 mmol/L   Potassium 3.6 3.5 - 5.1 mmol/L   Chloride 108 98 - 111 mmol/L   CO2 19 (L) 22 - 32 mmol/L   Glucose, Bld 149 (H) 70 - 99 mg/dL    Comment: Glucose reference range applies only to samples taken after fasting for at least 8 hours.   BUN 7 6 - 20 mg/dL   Creatinine, Ser 9.62 0.44 - 1.00 mg/dL   Calcium 8.8 (L) 8.9 - 10.3 mg/dL   Total Protein 6.6 6.5 - 8.1 g/dL   Albumin 2.7 (L) 3.5 - 5.0 g/dL   AST 19 15 - 41 U/L   ALT 13 0 - 44 U/L   Alkaline Phosphatase 62 38 - 126 U/L   Total Bilirubin 0.5 0.0 - 1.2 mg/dL   GFR, Estimated >95 >28 mL/min    Comment: (NOTE) Calculated using the CKD-EPI Creatinine Equation (2021)    Anion gap 11 5 - 15    Comment: Performed at [facility], [address], [place], [place] [address]  Protein / creatinine ratio, urine     Status: None   Collection Time: 04/25/23 11:50 PM  Result Value Ref Range   Creatinine, Urine 269 mg/dL   Total Protein, Urine 18 mg/dL  Comment: NO NORMAL RANGE ESTABLISHED FOR THIS TEST   Protein Creatinine Ratio 0.07 0.00 - 0.15 mg/mg[Cre]    Comment: Performed at [facility], [address], [place], [place] [address]  Urine rapid drug screen (hosp performed)not at [facility]     Status: None   Collection Time: 04/25/23 11:50 PM  Result Value Ref Range   Opiates NONE DETECTED NONE DETECTED   Cocaine NONE DETECTED NONE DETECTED   Benzodiazepines NONE DETECTED NONE DETECTED   Amphetamines NONE DETECTED NONE DETECTED   Tetrahydrocannabinol NONE DETECTED NONE DETECTED   Barbiturates NONE DETECTED NONE DETECTED    Comment: (NOTE) DRUG SCREEN FOR MEDICAL PURPOSES ONLY.  IF CONFIRMATION IS NEEDED FOR ANY PURPOSE, NOTIFY LAB WITHIN 5 DAYS.  LOWEST DETECTABLE LIMITS FOR URINE DRUG SCREEN Drug Class                     Cutoff (ng/mL) Amphetamine and metabolites    1000 Barbiturate and metabolites    200 Benzodiazepine                  200 Opiates and metabolites        300 Cocaine and metabolites        300 THC                            50 Performed at [facility], [address], [place], [place] [address]   CBC     Status: Abnormal   Collection Time: 04/26/23  5:55 AM  Result Value Ref Range   WBC 7.8 4.0 - 10.5 K/uL   RBC 3.72 (L) 3.87 - 5.11 MIL/uL   Hemoglobin 10.8 (L) 12.0 - 15.0 g/dL   HCT 78.4 (L) 69.6 - 29.5 %   MCV 86.6 80.0 - 100.0 fL   MCH 29.0 26.0 - 34.0 pg   MCHC 33.5 30.0 - 36.0 g/dL   RDW 28.4 13.2 - 44.0 %   Platelets 138 (L) 150 - 400 K/uL   nRBC 0.4 (H) 0.0 - 0.2 %    Comment: Performed at [facility], [address], [place], [place] [address]  Comprehensive metabolic panel     Status: Abnormal   Collection Time: 04/26/23  5:55 AM  Result Value Ref Range   Sodium 137 135 - 145 mmol/L   Potassium 3.5 3.5 - 5.1 mmol/L   Chloride 110 98 - 111 mmol/L   CO2 19 (L) 22 - 32 mmol/L   Glucose, Bld 134 (H) 70 - 99 mg/dL    Comment: Glucose reference range applies only to samples taken after fasting for at least 8 hours.   BUN 7 6 - 20 mg/dL   Creatinine, Ser 5.36 0.44 - 1.00 mg/dL   Calcium 8.7 (L) 8.9 - 10.3 mg/dL   Total Protein 6.4 (L) 6.5 - 8.1 g/dL   Albumin 2.9 (L) 3.5 - 5.0 g/dL   AST 13 (L) 15 - 41 U/L   ALT 12 0 - 44 U/L   Alkaline Phosphatase 69 38 - 126 U/L   Total Bilirubin 0.5 0.0 - 1.2 mg/dL   GFR, Estimated >64 >40 mL/min    Comment: (NOTE) Calculated using the CKD-EPI Creatinine Equation (2021)    Anion gap 8 5 - 15    Comment: Performed at [facility], [address] [place], [place] [address]    [place] MFM FETAL BPP WO NON  STRESS Result Date: 04/26/2023 ----------------------------------------------------------------------  OBSTETRICS REPORT                       (Signed Final 04/26/2023 12:18 pm) ---------------------------------------------------------------------- Patient Info  ID #:       [id number]                          D.O.B.:  [birth year] (27  yrs)(F)  Name:       Margaret Ingram               Visit Date: 04/26/2023 10:13 am ---------------------------------------------------------------------- Performed By  Attending:        Noralee Space MD        Secondary Phy.:   Tereso Newcomer MD  Performed By:     Louie Casa       Address:          [address]                                                             [place], [place]                                                             [address]  Referred By:      [facility]          [facility]  Ref. Address:     [address]                    [place], [place]                    [address] ---------------------------------------------------------------------- Orders  #  Description                           Code        Ordered By  1  [place] MFM FETAL BPP WO NON               84696.29    UGONNA ANYANWU     STRESS ----------------------------------------------------------------------  #  Order #  Accession #                Episode #  1  [id number]                   [id number]                 [id number] ---------------------------------------------------------------------- Indications  [redacted] weeks gestation of pregnancy                Z3A.33  Hypertension - Chronic/Pre-existing            O10.019  Maternal care for known or suspected poor      O36.5931  fetal growth, third trimester, fetus 1 IUGR  Obesity complicating pregnancy, third          O99.213  trimester  History of pre-term deliveries                 O09.219  Cervical cerclage suture present, third        O34.33  trimester  Vaginal bleeding in pregnancy, third trimester O46.93 ---------------------------------------------------------------------- Vital Signs  Weight (lb): 278                               Height:        5'2"  BMI:         50.84  ---------------------------------------------------------------------- Fetal Evaluation  Num Of Fetuses:         1  Fetal Heart Rate(bpm):  135  Cardiac Activity:       Observed  Presentation:           Cephalic  Placenta:               Anterior Fundal  P. Cord Insertion:      Visualized, central  Amniotic Fluid  AFI FV:      Within normal limits  AFI Sum(cm)     %Tile       Largest Pocket(cm)  13.2            42          4.2  RUQ(cm)       RLQ(cm)       LUQ(cm)        LLQ(cm)  4.2           3.1           1.7            4.2 ---------------------------------------------------------------------- Biophysical Evaluation  Amniotic F.V:   Pocket => 2 cm             F. Tone:        Observed  F. Movement:    Observed                   Score:          8/8  F. Breathing:   Observed ---------------------------------------------------------------------- OB History  Blood Type:   A+  Gravidity:    6         Term:   2        Prem:   3  Living:       4 ---------------------------------------------------------------------- Gestational Age  Clinical EDD:  33w 3d  EDD:   06/11/23  Best:          33w 3d     Det. By:  Clinical EDD             EDD:   06/11/23 ---------------------------------------------------------------------- Anatomy  Ventricles:            Not well visualized    Stomach:                Appears normal, left                                                                        sided  Heart:                 Appears normal         Kidneys:                Not well visualized                         (4CH, axis, and                         situs)  Diaphragm:             Appears normal         Bladder:                Appears normal ---------------------------------------------------------------------- Impression  Amniotic fluid is normal and good fetal activity is seen  .Antenatal testing is reassuring. BPP 8/8. ----------------------------------------------------------------------                  Noralee Space, MD Electronically Signed Final Report   04/26/2023 12:18 pm ----------------------------------------------------------------------   [place] MFM OB FOLLOW UP Result Date: 04/25/2023 ----------------------------------------------------------------------  OBSTETRICS REPORT                       (Signed Final 04/25/2023 05:20 pm) ---------------------------------------------------------------------- Patient Info  ID #:       [id number]                          D.O.B.:  26-Nov-1995 (27 yrs)(F)  Name:       Margaret Ingram               Visit Date: 04/25/2023 08:50 am ---------------------------------------------------------------------- Performed By  Attending:        Ma Rings MD         Ref. Address:     [address]                                                             [place], [place]  [address]  Performed By:     Tommie Raymond BS,       [facility]  Referred By:      [facility] ---------------------------------------------------------------------- Orders  #  Description                           Code        Ordered By  1  [place] MFM OB FOLLOW UP                   [phone]    YU FANG  2  [place] MFM UA CORD DOPPLER                76820.02    YU FANG  3  [place] MFM FETAL BPP WO NON               76819.01    YU FANG     STRESS ----------------------------------------------------------------------  #  Order #                     Accession #                Episode #  1  [id number]                   [id number]                 [id number]  2  [id number]                   [id number]                 [id number]  3  [id number]                   [id number]                 [id number]  ---------------------------------------------------------------------- Indications  Maternal care for known or suspected poor      O36.5930  fetal growth, third trimester, single or  unspecified fetus IUGR  [redacted] weeks gestation of pregnancy                Z3A.33  Hypertension - Chronic/Pre-existing -  O10.019  Nifedipine  Obesity complicating pregnancy, third          O99.213  trimester (BMI 51)  Marginal insertion of umbilical cord affecting O43.193  management of mother in third trimester  Cervical cerclage suture present, third        O34.33  trimester (01/28/23)  History of cesarean delivery, currently        O34.219  pregnant X2  Poor obstetric history: Previous preterm       O09.219  delivery, antepartum ([redacted]w[redacted]d, [redacted]w[redacted]d, 32w)  Poor obstetric history: Previous               O09.299  preeclampsia / eclampsia/gestational HTN  Poor obstetric history: Previous gestational   O09.299  diabetes ---------------------------------------------------------------------- Vital Signs                                                 Height:        5'2" ---------------------------------------------------------------------- Fetal Evaluation  Num Of Fetuses:         1  Fetal Heart Rate(bpm):  123  Cardiac Activity:       Observed  Presentation:           Cephalic  Placenta:               Anterior  P. Cord Insertion:      Previously seen  Amniotic Fluid  AFI FV:      Within normal limits  AFI Sum(cm)     %Tile       Largest Pocket(cm)  11.79           31          3.38  RUQ(cm)       RLQ(cm)       LUQ(cm)        LLQ(cm)  3.38          3.38          2.91           2.12 ---------------------------------------------------------------------- Biophysical Evaluation  Amniotic F.V:   Pocket => 2 cm             F. Tone:        Not Observed  F. Movement:    Not Observed               Score:          4/8  F. Breathing:   Observed ---------------------------------------------------------------------- Biometry  BPD:      78.2  mm     G.  Age:  31w 3d          6  %    CI:        75.13   %    70 - 86                                                          FL/HC:      21.0   %    19.9 - 21.5  HC:      286.2  mm     G. Age:  31w 3d          1  %  HC/AC:      1.12        0.96 - 1.11  AC:      255.9  mm     G. Age:  29w 5d        < 1  %    FL/BPD:     76.7   %    71 - 87  FL:         60  mm     G. Age:  31w 2d        4.5  %    FL/AC:      23.4   %    20 - 24  LV:        3.9  mm  Est. FW:    1589  gm      3 lb 8 oz    1.5  % ---------------------------------------------------------------------- OB History  Blood Type:   A+  Gravidity:    6         Term:   2        Prem:   3  Living:       4 ---------------------------------------------------------------------- Gestational Age  U/S Today:     31w 0d                                        EDD:   06/27/23  Best:          33w 1d     Det. ByMarcella Dubs         EDD:   06/12/23                                      (11/14/22) ---------------------------------------------------------------------- Anatomy  Cranium:               Appears normal         LVOT:                   Previously seen  Cavum:                 Appears normal         Aortic Arch:            Previously seen  Ventricles:            Appears normal         Ductal Arch:            Not well visualized  Choroid Plexus:        Previously seen        Diaphragm:              Appears normal  Cerebellum:            Previously seen        Stomach:                Appears normal, left  sided  Posterior Fossa:       Previously seen        Abdomen:                Previously seen  Nuchal Fold:           Not applicable (>20    Abdominal Wall:         Previously seen                         wks GA)  Face:                  Orbits appear          Cord Vessels:           Previously seen                         normal  Lips:                  Not well visualized    Kidneys:                 Appear normal  Palate:                Not well visualized    Bladder:                Appears normal  Thoracic:              Appears normal         Spine:                  Not well visualized  Heart:                 Appears normal         Upper Extremities:      Not well visualized                         (4CH, axis, and                         situs)  RVOT:                  Previously seen        Lower Extremities:      Not well visualized  Other:  Fetus appears to be a female. ---------------------------------------------------------------------- Doppler - Fetal Vessels  Umbilical Artery   S/D     %tile      RI    %tile      PI    %tile     PSV    ADFV    RDFV                                                     (cm/s)   3.37       87     0.7       87    1.18       93    54.81      No      No ---------------------------------------------------------------------- Cervix Uterus Adnexa  Cervix  Not visualized (advanced GA >24wks)  Uterus  No abnormality visualized.  Right Ovary  Not visualized.  Left Ovary  Not visualized.  Cul De Sac  No free fluid seen.  Adnexa  No abnormality visualized ---------------------------------------------------------------------- Comments  Margaret Ingram is currently at 33 weeks and 2 days.  She  has been followed due to maternal obesity with a BMI of 51  and severe IUGR.  The patient was seen in the MAU last  night due to vaginal bleeding.  Admission to the hospital was  recommended.  However, she signed out AGAINST  MEDICAL ADVICE.  She did receive the first dose of  betamethasone prior to signing out.  The patient's blood pressure today was 160/98.  The overall EFW obtained today was 3 pounds 8 ounces  (2nd percentile for her gestational age indicating severe  IUGR).  A biophysical profile performed today was 4 out of 8.  She  received a -2 for absent fetal movements and another -2  absent fetal tone.  There was normal amniotic fluid noted with a total AFI of  11.79 cm.  The fetus was  in the vertex presentation.  Doppler studies of the umbilical arteries performed today  showed a normal S/D ratio of 3.37.  There were no signs of  absent or reversed end-diastolic flow.  I discussed her case with Dr. Crissie Reese.  Due to severe IUGR, her elevated blood pressures, the  abnormal fetal testing today, and her vaginal bleeding, I  recommend that the patient be admitted to the hospital for  observation and be worked up to determine if she has  preeclampsia.  She should receive the second dose of betamethasone this  evening.  Should she be diagnosed with preeclampsia, delivery is  recommended at around 34 weeks.  She should continue  inpatient management until delivery.  Should preeclampsia be ruled out, inpatient versus outpatient  management will depend on her fetal testing and the amount  of vaginal bleeding she is experiencing.  A total of 30 minutes was spent counseling and coordinating  the care for this patient.  Greater than 50% of the time was  spent in direct face-to-face contact. ----------------------------------------------------------------------                  Ma Rings, MD Electronically Signed Final Report   04/25/2023 05:20 pm ----------------------------------------------------------------------   [place] MFM UA CORD DOPPLER Result Date: 04/25/2023 ----------------------------------------------------------------------  OBSTETRICS REPORT                       (Signed Final 04/25/2023 05:20 pm) ---------------------------------------------------------------------- Patient Info  ID #:       [id number]                          D.O.B.:  01/21/1996 (27 yrs)(F)  Name:       Margaret Ingram               Visit Date: 04/25/2023 08:50 am ---------------------------------------------------------------------- Performed By  Attending:        Ma Rings MD         Ref. Address:     [address]                                                             [place], [place]  [address]  Performed By:     Tommie Raymond BS,       [facility]                                [facility]  Referred By:      [facility] ---------------------------------------------------------------------- Orders  #  Description                           Code        Ordered By  1  [place] MFM OB FOLLOW UP                   [phone]    YU FANG  2  [place] MFM UA CORD DOPPLER                76820.02    YU FANG  3  [place] MFM FETAL BPP WO NON               76819.01    YU FANG     STRESS ----------------------------------------------------------------------  #  Order #                     Accession #                Episode #  1  [id number]                   [id number]                 [id number]  2  [id number]                   [id number]                 [id number]  3  [id number]                   [id number]                 [id number] ---------------------------------------------------------------------- Indications  Maternal care for known or suspected poor      O36.5930  fetal growth, third trimester, single or  unspecified fetus IUGR  [redacted] weeks gestation of pregnancy                Z3A.33  Hypertension - Chronic/Pre-existing -  O10.019  Nifedipine  Obesity complicating pregnancy, third          O99.213  trimester (BMI 51)  Marginal insertion of umbilical cord affecting O43.193  management of mother in third trimester  Cervical cerclage suture present, third        O34.33  trimester (01/28/23)  History of cesarean delivery, currently        O34.219  pregnant X2  Poor obstetric history: Previous preterm       O09.219  delivery, antepartum ([redacted]w[redacted]d, [redacted]w[redacted]d, 32w)  Poor obstetric history: Previous               O09.299  preeclampsia / eclampsia/gestational HTN  Poor obstetric history: Previous  gestational   O09.299  diabetes ---------------------------------------------------------------------- Vital Signs                                                 Height:        5'2" ---------------------------------------------------------------------- Fetal Evaluation  Num Of Fetuses:         1  Fetal Heart Rate(bpm):  123  Cardiac Activity:       Observed  Presentation:           Cephalic  Placenta:               Anterior  P. Cord Insertion:      Previously seen  Amniotic Fluid  AFI FV:      Within normal limits  AFI Sum(cm)     %Tile       Largest Pocket(cm)  11.79           31          3.38  RUQ(cm)       RLQ(cm)       LUQ(cm)        LLQ(cm)  3.38          3.38          2.91           2.12 ---------------------------------------------------------------------- Biophysical Evaluation  Amniotic F.V:   Pocket => 2 cm             F. Tone:        Not Observed  F. Movement:    Not Observed               Score:          4/8  F. Breathing:   Observed ---------------------------------------------------------------------- Biometry  BPD:      78.2  mm     G. Age:  31w 3d          6  %    CI:        75.13   %    70 - 86                                                          FL/HC:      21.0   %    19.9 - 21.5  HC:      286.2  mm     G. Age:  31w 3d          1  %  HC/AC:      1.12        0.96 - 1.11  AC:      255.9  mm     G. Age:  29w 5d        < 1  %    FL/BPD:     76.7   %    71 - 87  FL:         60  mm     G. Age:  31w 2d        4.5  %    FL/AC:      23.4   %    20 - 24  LV:        3.9  mm  Est. FW:    1589  gm      3 lb 8 oz    1.5  % ---------------------------------------------------------------------- OB History  Blood Type:   A+  Gravidity:    6         Term:   2        Prem:   3  Living:       4 ---------------------------------------------------------------------- Gestational Age  U/S Today:     31w 0d                                        EDD:   06/27/23  Best:          33w 1d     Det. ByMarcella Dubs          EDD:   06/12/23                                      (11/14/22) ---------------------------------------------------------------------- Anatomy  Cranium:               Appears normal         LVOT:                   Previously seen  Cavum:                 Appears normal         Aortic Arch:            Previously seen  Ventricles:            Appears normal         Ductal Arch:            Not well visualized  Choroid Plexus:        Previously seen        Diaphragm:              Appears normal  Cerebellum:            Previously seen        Stomach:                Appears normal, left  sided  Posterior Fossa:       Previously seen        Abdomen:                Previously seen  Nuchal Fold:           Not applicable (>20    Abdominal Wall:         Previously seen                         wks GA)  Face:                  Orbits appear          Cord Vessels:           Previously seen                         normal  Lips:                  Not well visualized    Kidneys:                Appear normal  Palate:                Not well visualized    Bladder:                Appears normal  Thoracic:              Appears normal         Spine:                  Not well visualized  Heart:                 Appears normal         Upper Extremities:      Not well visualized                         (4CH, axis, and                         situs)  RVOT:                  Previously seen        Lower Extremities:      Not well visualized  Other:  Fetus appears to be a female. ---------------------------------------------------------------------- Doppler - Fetal Vessels  Umbilical Artery   S/D     %tile      RI    %tile      PI    %tile     PSV    ADFV    RDFV                                                     (cm/s)   3.37       87     0.7       87    1.18       93    54.81      No      No ---------------------------------------------------------------------- Cervix Uterus  Adnexa  Cervix  Not visualized (advanced GA >24wks)  Uterus  No abnormality visualized.  Right Ovary  Not visualized.  Left Ovary  Not visualized.  Cul De Sac  No free fluid seen.  Adnexa  No abnormality visualized ---------------------------------------------------------------------- Comments  Margaret Ingram is currently at 33 weeks and 2 days.  She  has been followed due to maternal obesity with a BMI of 51  and severe IUGR.  The patient was seen in the MAU last  night due to vaginal bleeding.  Admission to the hospital was  recommended.  However, she signed out AGAINST  MEDICAL ADVICE.  She did receive the first dose of  betamethasone prior to signing out.  The patient's blood pressure today was 160/98.  The overall EFW obtained today was 3 pounds 8 ounces  (2nd percentile for her gestational age indicating severe  IUGR).  A biophysical profile performed today was 4 out of 8.  She  received a -2 for absent fetal movements and another -2  absent fetal tone.  There was normal amniotic fluid noted with a total AFI of  11.79 cm.  The fetus was in the vertex presentation.  Doppler studies of the umbilical arteries performed today  showed a normal S/D ratio of 3.37.  There were no signs of  absent or reversed end-diastolic flow.  I discussed her case with Dr. Crissie Reese.  Due to severe IUGR, her elevated blood pressures, the  abnormal fetal testing today, and her vaginal bleeding, I  recommend that the patient be admitted to the hospital for  observation and be worked up to determine if she has  preeclampsia.  She should receive the second dose of betamethasone this  evening.  Should she be diagnosed with preeclampsia, delivery is  recommended at around 34 weeks.  She should continue  inpatient management until delivery.  Should preeclampsia be ruled out, inpatient versus outpatient  management will depend on her fetal testing and the amount  of vaginal bleeding she is experiencing.  A total of 30 minutes was spent  counseling and coordinating  the care for this patient.  Greater than 50% of the time was  spent in direct face-to-face contact. ----------------------------------------------------------------------                  Ma Rings, MD Electronically Signed Final Report   04/25/2023 05:20 pm ----------------------------------------------------------------------   [place] MFM FETAL BPP WO NON STRESS Result Date: 04/25/2023 ----------------------------------------------------------------------  OBSTETRICS REPORT                       (Signed Final 04/25/2023 05:20 pm) ---------------------------------------------------------------------- Patient Info  ID #:       [id number]                          D.O.B.:  07/24/1995 (27 yrs)(F)  Name:       Margaret Ingram               Visit Date: 04/25/2023 08:50 am ---------------------------------------------------------------------- Performed By  Attending:        Ma Rings MD         Ref. Address:     [address]                                                             [place],  [place]                                                             [address]  Performed By:     Tommie Raymond BS,       [facility]  Referred By:      [facility] ---------------------------------------------------------------------- Orders  #  Description                           Code        Ordered By  1  [place] MFM OB FOLLOW UP                   [id number]    YU FANG  2  [place] MFM UA CORD DOPPLER                76820.02    YU FANG  3  [place] MFM FETAL BPP WO NON               [id number]    YU FANG     STRESS ----------------------------------------------------------------------  #  Order #                     Accession #                Episode #  1   [id number]                   [id number]                 [id number]  2  [id number]                   [id number]                 [id number]  3  [id number]                   [id number]  [id number] ---------------------------------------------------------------------- Indications  Maternal care for known or suspected poor      O36.5930  fetal growth, third trimester, single or  unspecified fetus IUGR  [redacted] weeks gestation of pregnancy                Z3A.33  Hypertension - Chronic/Pre-existing -          O10.019  Nifedipine  Obesity complicating pregnancy, third          O99.213  trimester (BMI 51)  Marginal insertion of umbilical cord affecting O43.193  management of mother in third trimester  Cervical cerclage suture present, third        O34.33  trimester (01/28/23)  History of cesarean delivery, currently        O34.219  pregnant X2  Poor obstetric history: Previous preterm       O09.219  delivery, antepartum ([redacted]w[redacted]d, [redacted]w[redacted]d, 32w)  Poor obstetric history: Previous               O09.299  preeclampsia / eclampsia/gestational HTN  Poor obstetric history: Previous gestational   O09.299  diabetes ---------------------------------------------------------------------- Vital Signs                                                 Height:        5'2" ---------------------------------------------------------------------- Fetal Evaluation  Num Of Fetuses:         1  Fetal Heart Rate(bpm):  123  Cardiac Activity:       Observed  Presentation:           Cephalic  Placenta:               Anterior  P. Cord Insertion:      Previously seen  Amniotic Fluid  AFI FV:      Within normal limits  AFI Sum(cm)     %Tile       Largest Pocket(cm)  11.79           31          3.38  RUQ(cm)       RLQ(cm)       LUQ(cm)        LLQ(cm)  3.38          3.38          2.91           2.12 ---------------------------------------------------------------------- Biophysical Evaluation  Amniotic F.V:   Pocket => 2 cm             F. Tone:        Not  Observed  F. Movement:    Not Observed               Score:          4/8  F. Breathing:   Observed ---------------------------------------------------------------------- Biometry  BPD:      78.2  mm     G. Age:  31w 3d          6  %    CI:        75.13   %    70 - 86  FL/HC:      21.0   %    19.9 - 21.5  HC:      286.2  mm     G. Age:  31w 3d          1  %    HC/AC:      1.12        0.96 - 1.11  AC:      255.9  mm     G. Age:  29w 5d        < 1  %    FL/BPD:     76.7   %    71 - 87  FL:         60  mm     G. Age:  31w 2d        4.5  %    FL/AC:      23.4   %    20 - 24  LV:        3.9  mm  Est. FW:    1589  gm      3 lb 8 oz    1.5  % ---------------------------------------------------------------------- OB History  Blood Type:   A+  Gravidity:    6         Term:   2        Prem:   3  Living:       4 ---------------------------------------------------------------------- Gestational Age  U/S Today:     31w 0d                                        EDD:   06/27/23  Best:          33w 1d     Det. ByMarcella Dubs         EDD:   06/12/23                                      (11/14/22) ---------------------------------------------------------------------- Anatomy  Cranium:               Appears normal         LVOT:                   Previously seen  Cavum:                 Appears normal         Aortic Arch:            Previously seen  Ventricles:            Appears normal         Ductal Arch:            Not well visualized  Choroid Plexus:        Previously seen        Diaphragm:              Appears normal  Cerebellum:            Previously seen        Stomach:                Appears normal, left  sided  Posterior Fossa:       Previously seen        Abdomen:                Previously seen  Nuchal Fold:           Not applicable (>20    Abdominal Wall:         Previously seen                          wks GA)  Face:                  Orbits appear          Cord Vessels:           Previously seen                         normal  Lips:                  Not well visualized    Kidneys:                Appear normal  Palate:                Not well visualized    Bladder:                Appears normal  Thoracic:              Appears normal         Spine:                  Not well visualized  Heart:                 Appears normal         Upper Extremities:      Not well visualized                         (4CH, axis, and                         situs)  RVOT:                  Previously seen        Lower Extremities:      Not well visualized  Other:  Fetus appears to be a female. ---------------------------------------------------------------------- Doppler - Fetal Vessels  Umbilical Artery   S/D     %tile      RI    %tile      PI    %tile     PSV    ADFV    RDFV                                                     (cm/s)   3.37       87     0.7       87    1.18       93    54.81      No      No ---------------------------------------------------------------------- Cervix Uterus Adnexa  Cervix  Not visualized (advanced GA >24wks)  Uterus  No abnormality visualized.  Right Ovary  Not visualized.  Left Ovary  Not visualized.  Cul De Sac  No free fluid seen.  Adnexa  No abnormality visualized ---------------------------------------------------------------------- Comments  Margaret Ingram is currently at 33 weeks and 2 days.  She  has been followed due to maternal obesity with a BMI of 51  and severe IUGR.  The patient was seen in the MAU last  night due to vaginal bleeding.  Admission to the hospital was  recommended.  However, she signed out AGAINST  MEDICAL ADVICE.  She did receive the first dose of  betamethasone prior to signing out.  The patient's blood pressure today was 160/98.  The overall EFW obtained today was 3 pounds 8 ounces  (2nd percentile for her gestational age indicating severe  IUGR).  A biophysical profile  performed today was 4 out of 8.  She  received a -2 for absent fetal movements and another -2  absent fetal tone.  There was normal amniotic fluid noted with a total AFI of  11.79 cm.  The fetus was in the vertex presentation.  Doppler studies of the umbilical arteries performed today  showed a normal S/D ratio of 3.37.  There were no signs of  absent or reversed end-diastolic flow.  I discussed her case with Dr. Crissie Reese.  Due to severe IUGR, her elevated blood pressures, the  abnormal fetal testing today, and her vaginal bleeding, I  recommend that the patient be admitted to the hospital for  observation and be worked up to determine if she has  preeclampsia.  She should receive the second dose of betamethasone this  evening.  Should she be diagnosed with preeclampsia, delivery is  recommended at around 34 weeks.  She should continue  inpatient management until delivery.  Should preeclampsia be ruled out, inpatient versus outpatient  management will depend on her fetal testing and the amount  of vaginal bleeding she is experiencing.  A total of 30 minutes was spent counseling and coordinating  the care for this patient.  Greater than 50% of the time was  spent in direct face-to-face contact. ----------------------------------------------------------------------                  Ma Rings, MD Electronically Signed Final Report   04/25/2023 05:20 pm ----------------------------------------------------------------------    Current scheduled medications  aspirin  162 mg Oral Daily   docusate sodium  100 mg Oral Daily   NIFEdipine  30 mg Oral Daily   prenatal multivitamin  1 tablet Oral Q1200   sodium chloride flush  3-10 mL Intravenous Q12H   valACYclovir  500 mg Oral BID    I have reviewed the patient's current medications.  ASSESSMENT: Principal Problem:   Vaginal bleeding in pregnancy, third trimester Active Problems:   History of severe pre-eclampsia   Chronic hypertension   Chronic hypertension  in pregnancy   Cervical cerclage suture present in third trimester   Obesity in pregnancy   IUGR (intrauterine growth restriction) affecting care of mother, third trimester   History of C-section x 2   PLAN: #. Vaginal bleeding - Bleed #2 on 3/5, suspect marginal abruption - No current bleeding - Maintain cerclage, no e/o PTL at this time - BID NST - Plan to monitor for 5-7 days post bleed  #. Worsening cHTN - Asymptomatic - 1 SR BP in MFM office 3/6, mild range BP since admission - will increase to procardia XL 60mg  daily - P/C 0.07 on admission, serum labs normal - defer magnesium  for seizure ppx, is not meeting criteria for SIPE w/ SF  #. FGR/FWB - @33 /2 1589g (1.5%), AC <1%, cephalic, anterior, 11.79, 4/8 (movement & tone), normal UAD - repeat BPP today 8/8 - given improvement in her antenatal testing, will space to BID NSTs  - s/p magnesium, BMZ & rescue BMZ 3/6-7 - s/p NICU consult in prior admission (1/9)  #. Prior CS x2, request for permanent sterilization For repeat with tubal Tubal papers signed 2/13  #. HSV Valtrex ppx  #.Routine No 2h, will get accuchecks fasting & 2h PP since she's received BMZ  Continue routine antenatal care.  Harvie Bridge, MD Obstetrician & Gynecologist, [facility] for [employer], [facility]

## 2023-04-26 NOTE — Progress Notes (Signed)
   04/26/23 1036  Spiritual Encounters  Type of Visit Initial  Care provided to: Pt and family  Reason for visit Advance directives  OnCall Visit No   Responded to consult request for AD, however patient was not interested in Center as her parents are her next of kin. Provided spiritual and emotional care while present.

## 2023-04-27 DIAGNOSIS — Z3A33 33 weeks gestation of pregnancy: Secondary | ICD-10-CM | POA: Diagnosis not present

## 2023-04-27 DIAGNOSIS — O4693 Antepartum hemorrhage, unspecified, third trimester: Secondary | ICD-10-CM | POA: Diagnosis not present

## 2023-04-27 DIAGNOSIS — O113 Pre-existing hypertension with pre-eclampsia, third trimester: Secondary | ICD-10-CM | POA: Diagnosis not present

## 2023-04-27 LAB — COMPREHENSIVE METABOLIC PANEL
ALT: 12 U/L (ref 0–44)
AST: 14 U/L — ABNORMAL LOW (ref 15–41)
Albumin: 2.6 g/dL — ABNORMAL LOW (ref 3.5–5.0)
Alkaline Phosphatase: 59 U/L (ref 38–126)
Anion gap: 9 (ref 5–15)
BUN: 9 mg/dL (ref 6–20)
CO2: 20 mmol/L — ABNORMAL LOW (ref 22–32)
Calcium: 8.4 mg/dL — ABNORMAL LOW (ref 8.9–10.3)
Chloride: 109 mmol/L (ref 98–111)
Creatinine, Ser: 0.54 mg/dL (ref 0.44–1.00)
GFR, Estimated: >60 mL/min (ref >60)
Glucose, Bld: 109 mg/dL — ABNORMAL HIGH (ref 70–99)
Potassium: 3.3 mmol/L — ABNORMAL LOW (ref 3.5–5.1)
Sodium: 138 mmol/L (ref 135–145)
Total Bilirubin: 0.2 mg/dL (ref 0.0–1.2)
Total Protein: 6.1 g/dL — ABNORMAL LOW (ref 6.5–8.1)

## 2023-04-27 LAB — CBC
HCT: 30.9 % — ABNORMAL LOW (ref 36.0–46.0)
Hemoglobin: 10.2 g/dL — ABNORMAL LOW (ref 12.0–15.0)
MCH: 29.1 pg (ref 26.0–34.0)
MCHC: 33 g/dL (ref 30.0–36.0)
MCV: 88 fL (ref 80.0–100.0)
Platelets: 126 10*3/uL — ABNORMAL LOW (ref 150–400)
RBC: 3.51 MIL/uL — ABNORMAL LOW (ref 3.87–5.11)
RDW: 13.1 % (ref 11.5–15.5)
WBC: 11.2 10*3/uL — ABNORMAL HIGH (ref 4.0–10.5)
nRBC: 0.5 % — ABNORMAL HIGH (ref 0.0–0.2)

## 2023-04-27 NOTE — Plan of Care (Signed)
  Problem: Education: Goal: Knowledge of General Education information will improve Description: Including pain rating scale, medication(s)/side effects and non-pharmacologic comfort measures Outcome: Progressing   Problem: Health Behavior/Discharge Planning: Goal: Ability to manage health-related needs will improve Outcome: Progressing   Problem: Clinical Measurements: Goal: Ability to maintain clinical measurements within normal limits will improve Outcome: Progressing Goal: Will remain free from infection Outcome: Progressing Goal: Diagnostic test results will improve Outcome: Progressing Goal: Respiratory complications will improve Outcome: Progressing Goal: Cardiovascular complication will be avoided Outcome: Progressing   Problem: Activity: Goal: Risk for activity intolerance will decrease Outcome: Progressing   Problem: Nutrition: Goal: Adequate nutrition will be maintained Outcome: Progressing   Problem: Coping: Goal: Level of anxiety will decrease Outcome: Progressing   Problem: Elimination: Goal: Will not experience complications related to bowel motility Outcome: Progressing Goal: Will not experience complications related to urinary retention Outcome: Progressing   Problem: Pain Managment: Goal: General experience of comfort will improve and/or be controlled Outcome: Progressing   Problem: Safety: Goal: Ability to remain free from injury will improve Outcome: Progressing   Problem: Skin Integrity: Goal: Risk for impaired skin integrity will decrease Outcome: Progressing   Problem: Education: Goal: Knowledge of disease or condition will improve Outcome: Progressing Goal: Knowledge of the prescribed therapeutic regimen will improve Outcome: Progressing Goal: Individualized Educational Video(s) Outcome: Progressing   Problem: Clinical Measurements: Goal: Complications related to the disease process, condition or treatment will be avoided or  minimized Outcome: Progressing   Problem: Education: Goal: Knowledge of disease or condition will improve Outcome: Progressing Goal: Knowledge of the prescribed therapeutic regimen will improve Outcome: Progressing   Problem: Fluid Volume: Goal: Peripheral tissue perfusion will improve Outcome: Progressing   Problem: Clinical Measurements: Goal: Complications related to disease process, condition or treatment will be avoided or minimized Outcome: Progressing

## 2023-04-27 NOTE — Progress Notes (Addendum)
 FACULTY PRACTICE ANTEPARTUM COMPREHENSIVE PROGRESS NOTE  Margaret Ingram is a 28 y.o. [id number] at [redacted]w[redacted]d with hx prior CS x2, cerclage in place and FGR (1.5%, nml UAD) who is admitted for vaginal bleeding, BPP 6/10, and worsening cHTN .  Estimated Date of Delivery: 06/11/23 Fetal presentation is cephalic.  Length of Stay:  2 Days. Admitted 04/25/2023  Subjective: No complaints this morning. Has not had bleeding since admission. She denies contractions, LOF. +FM. Denies HA, visual changes, CP/SOB, RUQ/epigastric pain  Vitals:  Blood pressure (!) 152/89, pulse 76, temperature 98.2 F (36.8 C), temperature source Oral, resp. rate 19, height 5\' 2"  (1.575 m), weight 126.1 kg, last menstrual period 09/04/2022, SpO2 96%. Physical Examination: CONSTITUTIONAL: No acute distress, well appearing CARDIOVASCULAR: Normal heart rate noted, RESPIRATORY: Effort normal, no problems with respiration noted ABDOMEN: Soft, nontender, nondistended, gravid.  NST 120bpm, moderate variability, +accels, no decels Toco flat  Results for orders placed or performed during the hospital encounter of 04/25/23 (from the past 48 hours)  Type and screen [facility]     Status: None   Collection Time: 04/25/23  5:28 PM  Result Value Ref Range   ABO/RH(D) A POS    Antibody Screen NEG    Sample Expiration      04/28/2023,2359 Performed at [facility], [address] [place], [place] [address]   CBC     Status: Abnormal   Collection Time: 04/25/23  9:07 PM  Result Value Ref Range   WBC 8.0 4.0 - 10.5 K/uL   RBC 3.50 (L) 3.87 - 5.11 MIL/uL   Hemoglobin 10.3 (L) 12.0 - 15.0 g/dL   HCT 95.6 (L) 21.3 - 08.6 %   MCV 86.6 80.0 - 100.0 fL   MCH 29.4 26.0 - 34.0 pg   MCHC 34.0 30.0 - 36.0 g/dL   RDW 57.8 46.9 - 62.9 %   Platelets 144 (L) 150 - 400 K/uL   nRBC 0.0 0.0 - 0.2 %    Comment: Performed at [facility], [address], [place], [place] [address]  Comprehensive metabolic panel      Status: Abnormal   Collection Time: 04/25/23  9:07 PM  Result Value Ref Range   Sodium 138 135 - 145 mmol/L   Potassium 3.6 3.5 - 5.1 mmol/L   Chloride 108 98 - 111 mmol/L   CO2 19 (L) 22 - 32 mmol/L   Glucose, Bld 149 (H) 70 - 99 mg/dL    Comment: Glucose reference range applies only to samples taken after fasting for at least 8 hours.   BUN 7 6 - 20 mg/dL   Creatinine, Ser 3.24 0.44 - 1.00 mg/dL   Calcium 8.8 (L) 8.9 - 10.3 mg/dL   Total Protein 6.6 6.5 - 8.1 g/dL   Albumin 2.7 (L) 3.5 - 5.0 g/dL   AST 19 15 - 41 U/L   ALT 13 0 - 44 U/L   Alkaline Phosphatase 62 38 - 126 U/L   Total Bilirubin 0.5 0.0 - 1.2 mg/dL   GFR, Estimated >40 >10 mL/min    Comment: (NOTE) Calculated using the CKD-EPI Creatinine Equation (2021)    Anion gap 11 5 - 15    Comment: Performed at [facility], [address], [place], [place] [address]  Protein / creatinine ratio, urine     Status: None   Collection Time: 04/25/23 11:50 PM  Result Value Ref Range   Creatinine, Urine 269 mg/dL   Total Protein,  Urine 18 mg/dL    Comment: NO NORMAL RANGE ESTABLISHED FOR THIS TEST   Protein Creatinine Ratio 0.07 0.00 - 0.15 mg/mg[Cre]    Comment: Performed at [facility], [address] [place], [place] [address]  Urine rapid drug screen (hosp performed)not at [facility]     Status: None   Collection Time: 04/25/23 11:50 PM  Result Value Ref Range   Opiates NONE DETECTED NONE DETECTED   Cocaine NONE DETECTED NONE DETECTED   Benzodiazepines NONE DETECTED NONE DETECTED   Amphetamines NONE DETECTED NONE DETECTED   Tetrahydrocannabinol NONE DETECTED NONE DETECTED   Barbiturates NONE DETECTED NONE DETECTED    Comment: (NOTE) DRUG SCREEN FOR MEDICAL PURPOSES ONLY.  IF CONFIRMATION IS NEEDED FOR ANY PURPOSE, NOTIFY LAB WITHIN 5 DAYS.  LOWEST DETECTABLE LIMITS FOR URINE DRUG SCREEN Drug Class                     Cutoff (ng/mL) Amphetamine and metabolites    1000 Barbiturate and metabolites     200 Benzodiazepine                 200 Opiates and metabolites        300 Cocaine and metabolites        300 THC                            50 Performed at [facility], [address], [place], [place] [address]   CBC     Status: Abnormal   Collection Time: 04/26/23  5:55 AM  Result Value Ref Range   WBC 7.8 4.0 - 10.5 K/uL   RBC 3.72 (L) 3.87 - 5.11 MIL/uL   Hemoglobin 10.8 (L) 12.0 - 15.0 g/dL   HCT 09.8 (L) 11.9 - 14.7 %   MCV 86.6 80.0 - 100.0 fL   MCH 29.0 26.0 - 34.0 pg   MCHC 33.5 30.0 - 36.0 g/dL   RDW 82.9 56.2 - 13.0 %   Platelets 138 (L) 150 - 400 K/uL   nRBC 0.4 (H) 0.0 - 0.2 %    Comment: Performed at [facility], [address] [place], [place] [address]  Comprehensive metabolic panel     Status: Abnormal   Collection Time: 04/26/23  5:55 AM  Result Value Ref Range   Sodium 137 135 - 145 mmol/L   Potassium 3.5 3.5 - 5.1 mmol/L   Chloride 110 98 - 111 mmol/L   CO2 19 (L) 22 - 32 mmol/L   Glucose, Bld 134 (H) 70 - 99 mg/dL    Comment: Glucose reference range applies only to samples taken after fasting for at least 8 hours.   BUN 7 6 - 20 mg/dL   Creatinine, Ser 4.69 0.44 - 1.00 mg/dL   Calcium 8.7 (L) 8.9 - 10.3 mg/dL   Total Protein 6.4 (L) 6.5 - 8.1 g/dL   Albumin 2.9 (L) 3.5 - 5.0 g/dL   AST 13 (L) 15 - 41 U/L   ALT 12 0 - 44 U/L   Alkaline Phosphatase 69 38 - 126 U/L   Total Bilirubin 0.5 0.0 - 1.2 mg/dL   GFR, Estimated >62 >95 mL/min    Comment: (NOTE) Calculated using the CKD-EPI Creatinine Equation (2021)    Anion gap 8 5 - 15    Comment: Performed at [facility], [address], [place], [place] [address]  CBC  Status: Abnormal   Collection Time: 04/27/23  4:14 AM  Result Value Ref Range   WBC 11.2 (H) 4.0 - 10.5 K/uL   RBC 3.51 (L) 3.87 - 5.11 MIL/uL   Hemoglobin 10.2 (L) 12.0 - 15.0 g/dL   HCT 16.1 (L) 09.6 - 04.5 %   MCV 88.0 80.0 - 100.0 fL   MCH 29.1 26.0 - 34.0 pg   MCHC 33.0 30.0 - 36.0 g/dL   RDW 40.9 81.1 -  91.4 %   Platelets 126 (L) 150 - 400 K/uL   nRBC 0.5 (H) 0.0 - 0.2 %    Comment: Performed at [facility], [address], [place], [place] [address]  Comprehensive metabolic panel     Status: Abnormal   Collection Time: 04/27/23  4:14 AM  Result Value Ref Range   Sodium 138 135 - 145 mmol/L   Potassium 3.3 (L) 3.5 - 5.1 mmol/L   Chloride 109 98 - 111 mmol/L   CO2 20 (L) 22 - 32 mmol/L   Glucose, Bld 109 (H) 70 - 99 mg/dL    Comment: Glucose reference range applies only to samples taken after fasting for at least 8 hours.   BUN 9 6 - 20 mg/dL   Creatinine, Ser 6.21 0.44 - 1.00 mg/dL   Calcium 8.4 (L) 8.9 - 10.3 mg/dL   Total Protein 6.1 (L) 6.5 - 8.1 g/dL   Albumin 2.6 (L) 3.5 - 5.0 g/dL   AST 14 (L) 15 - 41 U/L   ALT 12 0 - 44 U/L   Alkaline Phosphatase 59 38 - 126 U/L   Total Bilirubin 0.2 0.0 - 1.2 mg/dL   GFR, Estimated >30 >86 mL/min    Comment: (NOTE) Calculated using the CKD-EPI Creatinine Equation (2021)    Anion gap 9 5 - 15    Comment: Performed at [facility], [address], [place], [place] [address]    [place] MFM FETAL BPP WO NON STRESS Result Date: 04/26/2023 ----------------------------------------------------------------------  OBSTETRICS REPORT                       (Signed Final 04/26/2023 12:18 pm) ---------------------------------------------------------------------- Patient Info  ID #:       [id number]                          D.O.B.:  April 07, 1995 (27 yrs)(F)  Name:       [name]               Visit Date: 04/26/2023 10:13 am ---------------------------------------------------------------------- Performed By  Attending:        Noralee Space MD        Secondary Phy.:   Tereso Newcomer MD  Performed By:     Louie Casa       Address:          [address]                    [place]  [place], [place]                                                              [address]  Referred By:      [facility]          [facility]  Ref. Address:     [address]                    [place], [place]                    [address] ---------------------------------------------------------------------- Orders  #  Description                           Code        Ordered By  1  [place] MFM FETAL BPP WO NON               20254.27    Jaynie Collins     STRESS ----------------------------------------------------------------------  #  Order #                     Accession #                Episode #  1  [id number]                   [id number]                 [id number] ---------------------------------------------------------------------- Indications  [redacted] weeks gestation of pregnancy                Z3A.33  Hypertension - Chronic/Pre-existing            O10.019  Maternal care for known or suspected poor      O36.5931  fetal growth, third trimester, fetus 1 IUGR  Obesity complicating pregnancy, third          O99.213  trimester  History of pre-term deliveries                 O09.219  Cervical cerclage suture present, third        O34.33  trimester  Vaginal bleeding in pregnancy, third trimester O46.93 ---------------------------------------------------------------------- Vital Signs  Weight (lb): 278                               Height:        5'2"  BMI:         50.84 ---------------------------------------------------------------------- Fetal Evaluation  Num Of Fetuses:         1  Fetal Heart Rate(bpm):  135  Cardiac Activity:       Observed  Presentation:           Cephalic  Placenta:  Anterior Fundal  P. Cord Insertion:      Visualized, central  Amniotic Fluid  AFI FV:      Within normal limits  AFI Sum(cm)     %Tile       Largest Pocket(cm)  13.2            42          4.2  RUQ(cm)       RLQ(cm)       LUQ(cm)        LLQ(cm)  4.2           3.1           1.7            4.2  ---------------------------------------------------------------------- Biophysical Evaluation  Amniotic F.V:   Pocket => 2 cm             F. Tone:        Observed  F. Movement:    Observed                   Score:          8/8  F. Breathing:   Observed ---------------------------------------------------------------------- OB History  Blood Type:   A+  Gravidity:    6         Term:   2        Prem:   3  Living:       4 ---------------------------------------------------------------------- Gestational Age  Clinical EDD:  33w 3d                                        EDD:   06/11/23  Best:          33w 3d     Det. By:  Clinical EDD             EDD:   06/11/23 ---------------------------------------------------------------------- Anatomy  Ventricles:            Not well visualized    Stomach:                Appears normal, left                                                                        sided  Heart:                 Appears normal         Kidneys:                Not well visualized                         (4CH, axis, and                         situs)  Diaphragm:             Appears normal         Bladder:                Appears normal ---------------------------------------------------------------------- Impression  Amniotic fluid is  normal and good fetal activity is seen  .Antenatal testing is reassuring. BPP 8/8. ----------------------------------------------------------------------                 Noralee Space, MD Electronically Signed Final Report   04/26/2023 12:18 pm ----------------------------------------------------------------------    Current scheduled medications  aspirin  162 mg Oral Daily   docusate sodium  100 mg Oral Daily   NIFEdipine  60 mg Oral Daily   prenatal multivitamin  1 tablet Oral Q1200   sodium chloride flush  3-10 mL Intravenous Q12H   valACYclovir  500 mg Oral BID    I have reviewed the patient's current medications.  ASSESSMENT: Principal Problem:   Vaginal bleeding  in pregnancy, third trimester Active Problems:   History of severe pre-eclampsia   Chronic hypertension   Chronic hypertension in pregnancy   Cervical cerclage suture present in third trimester   Obesity in pregnancy   IUGR (intrauterine growth restriction) affecting care of mother, third trimester   History of C-section x 2   PLAN: #. Vaginal bleeding - Bleed #2 on 3/5, suspect marginal abruption - No current bleeding - Maintain cerclage, no e/o PTL at this time - BID NST - Plan to monitor for 5-7 days post bleed  #. Worsening cHTN - Asymptomatic - 1 SR BP in MFM office 3/6, mild range BP since admission - Continue procardia XL 60mg  daily - P/C 0.07 on admission. Plt starting to downtrend, 126 this AM from 144 on admission. Continue daily labs - defer magnesium for seizure ppx, is not meeting criteria for SIPE w/ SF  #. FGR/FWB - @33 /2 1589g (1.5%), AC <1%, cephalic, anterior, 11.79, 4/8 (movement & tone), normal UAD - BID NSTs - s/p magnesium, BMZ & rescue BMZ 3/6-7 - s/p NICU consult in prior admission (1/9)  #. Prior CS x2, request for permanent sterilization For repeat with tubal Tubal papers signed 2/13  #. HSV Valtrex ppx  #.Routine No 2h, will get accuchecks fasting & 2h PP since she's received BMZ  Continue routine antenatal care.  Harvie Bridge, MD Obstetrician & Gynecologist, [facility] for [employer], [facility]

## 2023-04-28 ENCOUNTER — Inpatient Hospital Stay (HOSPITAL_COMMUNITY): Payer: MEDICAID

## 2023-04-28 DIAGNOSIS — O288 Other abnormal findings on antenatal screening of mother: Secondary | ICD-10-CM | POA: Diagnosis not present

## 2023-04-28 DIAGNOSIS — O10013 Pre-existing essential hypertension complicating pregnancy, third trimester: Secondary | ICD-10-CM

## 2023-04-28 DIAGNOSIS — O365930 Maternal care for other known or suspected poor fetal growth, third trimester, not applicable or unspecified: Secondary | ICD-10-CM

## 2023-04-28 DIAGNOSIS — Z3A33 33 weeks gestation of pregnancy: Secondary | ICD-10-CM | POA: Diagnosis not present

## 2023-04-28 DIAGNOSIS — O99213 Obesity complicating pregnancy, third trimester: Secondary | ICD-10-CM

## 2023-04-28 DIAGNOSIS — O4693 Antepartum hemorrhage, unspecified, third trimester: Secondary | ICD-10-CM

## 2023-04-28 DIAGNOSIS — E669 Obesity, unspecified: Secondary | ICD-10-CM

## 2023-04-28 DIAGNOSIS — O3433 Maternal care for cervical incompetence, third trimester: Secondary | ICD-10-CM | POA: Diagnosis not present

## 2023-04-28 DIAGNOSIS — O113 Pre-existing hypertension with pre-eclampsia, third trimester: Secondary | ICD-10-CM | POA: Diagnosis not present

## 2023-04-28 LAB — COMPREHENSIVE METABOLIC PANEL
ALT: 11 U/L (ref 0–44)
AST: 16 U/L (ref 15–41)
Albumin: 2.7 g/dL — ABNORMAL LOW (ref 3.5–5.0)
Alkaline Phosphatase: 59 U/L (ref 38–126)
Anion gap: 7 (ref 5–15)
BUN: 6 mg/dL (ref 6–20)
CO2: 23 mmol/L (ref 22–32)
Calcium: 8.7 mg/dL — ABNORMAL LOW (ref 8.9–10.3)
Chloride: 106 mmol/L (ref 98–111)
Creatinine, Ser: 0.5 mg/dL (ref 0.44–1.00)
GFR, Estimated: >60 mL/min (ref >60)
Glucose, Bld: 117 mg/dL — ABNORMAL HIGH (ref 70–99)
Potassium: 3.4 mmol/L — ABNORMAL LOW (ref 3.5–5.1)
Sodium: 136 mmol/L (ref 135–145)
Total Bilirubin: 0.4 mg/dL (ref 0.0–1.2)
Total Protein: 6 g/dL — ABNORMAL LOW (ref 6.5–8.1)

## 2023-04-28 LAB — CBC
HCT: 33.2 % — ABNORMAL LOW (ref 36.0–46.0)
Hemoglobin: 11.1 g/dL — ABNORMAL LOW (ref 12.0–15.0)
MCH: 29.4 pg (ref 26.0–34.0)
MCHC: 33.4 g/dL (ref 30.0–36.0)
MCV: 87.8 fL (ref 80.0–100.0)
Platelets: 143 10*3/uL — ABNORMAL LOW (ref 150–400)
RBC: 3.78 MIL/uL — ABNORMAL LOW (ref 3.87–5.11)
RDW: 13 % (ref 11.5–15.5)
WBC: 8.8 10*3/uL (ref 4.0–10.5)
nRBC: 1.3 % — ABNORMAL HIGH (ref 0.0–0.2)

## 2023-04-28 MED ORDER — INSULIN ASPART 100 UNIT/ML IJ SOLN
0.0000 [IU] | Freq: Three times a day (TID) | INTRAMUSCULAR | Status: DC
Start: 2023-04-28 — End: 2023-04-28

## 2023-04-28 MED ORDER — NIFEDIPINE ER OSMOTIC RELEASE 60 MG PO TB24
90.0000 mg | ORAL_TABLET | Freq: Every morning | ORAL | Status: DC
  Administered 2023-04-29 – 2023-04-30 (×2): 90 mg via ORAL
  Filled 2023-04-28 (×2): qty 1

## 2023-04-28 MED ORDER — NIFEDIPINE ER OSMOTIC RELEASE 30 MG PO TB24
30.0000 mg | ORAL_TABLET | Freq: Every evening | ORAL | Status: DC
  Administered 2023-04-28: 30 mg via ORAL
  Filled 2023-04-28: qty 1

## 2023-04-28 MED ORDER — NIFEDIPINE ER OSMOTIC RELEASE 60 MG PO TB24
60.0000 mg | ORAL_TABLET | Freq: Every morning | ORAL | Status: DC
  Administered 2023-04-28: 60 mg via ORAL
  Filled 2023-04-28: qty 1

## 2023-04-28 NOTE — Plan of Care (Signed)
  Problem: Education: Goal: Knowledge of General Education information will improve Description: Including pain rating scale, medication(s)/side effects and non-pharmacologic comfort measures Outcome: Progressing   Problem: Health Behavior/Discharge Planning: Goal: Ability to manage health-related needs will improve Outcome: Progressing   Problem: Clinical Measurements: Goal: Ability to maintain clinical measurements within normal limits will improve Outcome: Progressing Goal: Will remain free from infection Outcome: Progressing Goal: Diagnostic test results will improve Outcome: Progressing Goal: Respiratory complications will improve Outcome: Progressing Goal: Cardiovascular complication will be avoided Outcome: Progressing   Problem: Activity: Goal: Risk for activity intolerance will decrease Outcome: Progressing   Problem: Nutrition: Goal: Adequate nutrition will be maintained Outcome: Progressing   Problem: Coping: Goal: Level of anxiety will decrease Outcome: Progressing   Problem: Elimination: Goal: Will not experience complications related to bowel motility Outcome: Progressing Goal: Will not experience complications related to urinary retention Outcome: Progressing   Problem: Pain Managment: Goal: General experience of comfort will improve and/or be controlled Outcome: Progressing   Problem: Safety: Goal: Ability to remain free from injury will improve Outcome: Progressing   Problem: Skin Integrity: Goal: Risk for impaired skin integrity will decrease Outcome: Progressing   Problem: Education: Goal: Knowledge of disease or condition will improve Outcome: Progressing Goal: Knowledge of the prescribed therapeutic regimen will improve Outcome: Progressing Goal: Individualized Educational Video(s) Outcome: Progressing   Problem: Clinical Measurements: Goal: Complications related to the disease process, condition or treatment will be avoided or  minimized Outcome: Progressing   Problem: Education: Goal: Knowledge of disease or condition will improve Outcome: Progressing Goal: Knowledge of the prescribed therapeutic regimen will improve Outcome: Progressing   Problem: Fluid Volume: Goal: Peripheral tissue perfusion will improve Outcome: Progressing   Problem: Clinical Measurements: Goal: Complications related to disease process, condition or treatment will be avoided or minimized Outcome: Progressing   Problem: Education: Goal: Ability to describe self-care measures that may prevent or decrease complications (Diabetes Survival Skills Education) will improve Outcome: Progressing Goal: Individualized Educational Video(s) Outcome: Progressing   Problem: Coping: Goal: Ability to adjust to condition or change in health will improve Outcome: Progressing   Problem: Fluid Volume: Goal: Ability to maintain a balanced intake and output will improve Outcome: Progressing   Problem: Health Behavior/Discharge Planning: Goal: Ability to identify and utilize available resources and services will improve Outcome: Progressing Goal: Ability to manage health-related needs will improve Outcome: Progressing   Problem: Metabolic: Goal: Ability to maintain appropriate glucose levels will improve Outcome: Progressing   Problem: Nutritional: Goal: Maintenance of adequate nutrition will improve Outcome: Progressing Goal: Progress toward achieving an optimal weight will improve Outcome: Progressing   Problem: Skin Integrity: Goal: Risk for impaired skin integrity will decrease Outcome: Progressing   Problem: Tissue Perfusion: Goal: Adequacy of tissue perfusion will improve Outcome: Progressing

## 2023-04-28 NOTE — Progress Notes (Signed)
 FACULTY PRACTICE ANTEPARTUM COMPREHENSIVE PROGRESS NOTE  Margaret Ingram is a 28 y.o. [id number] at [redacted]w[redacted]d with hx prior CS x2, cerclage in place and FGR (1.5%, nml UAD) who is admitted for vaginal bleeding, BPP 6/10, and worsening cHTN .  Estimated Date of Delivery: 06/11/23 Fetal presentation is cephalic.  Length of Stay:  3 Days. Admitted 04/25/2023  Subjective: Still no bleeding. Doing well with no complaints. Denies HA, visual changes, CP/SOB, RUQ/epigastric pain Denies contractions or LOF. +FM.  Vitals:  Blood pressure (!) 158/88, pulse 89, temperature 98 F (36.7 C), temperature source Oral, resp. rate 18, height 5\' 2"  (1.575 m), weight 126.1 kg, last menstrual period 09/04/2022, SpO2 100%. Physical Examination: CONSTITUTIONAL: No acute distress, well appearing CARDIOVASCULAR: Normal heart rate noted, RESPIRATORY: Effort normal, no problems with respiration noted ABDOMEN: Soft, nontender, nondistended, gravid.  NST 130bpm, moderate variability, 10x10, no decels Toco flat  Results for orders placed or performed during the hospital encounter of 04/25/23 (from the past 48 hours)  CBC     Status: Abnormal   Collection Time: 04/27/23  4:14 AM  Result Value Ref Range   WBC 11.2 (H) 4.0 - 10.5 K/uL   RBC 3.51 (L) 3.87 - 5.11 MIL/uL   Hemoglobin 10.2 (L) 12.0 - 15.0 g/dL   HCT 45.4 (L) 09.8 - 11.9 %   MCV 88.0 80.0 - 100.0 fL   MCH 29.1 26.0 - 34.0 pg   MCHC 33.0 30.0 - 36.0 g/dL   RDW 14.7 82.9 - 56.2 %   Platelets 126 (L) 150 - 400 K/uL   nRBC 0.5 (H) 0.0 - 0.2 %    Comment: Performed at [facility], [address] [place], [place] [address]  Comprehensive metabolic panel     Status: Abnormal   Collection Time: 04/27/23  4:14 AM  Result Value Ref Range   Sodium 138 135 - 145 mmol/L   Potassium 3.3 (L) 3.5 - 5.1 mmol/L   Chloride 109 98 - 111 mmol/L   CO2 20 (L) 22 - 32 mmol/L   Glucose, Bld 109 (H) 70 - 99 mg/dL    Comment: Glucose reference range applies only to  samples taken after fasting for at least 8 hours.   BUN 9 6 - 20 mg/dL   Creatinine, Ser 5.78 0.44 - 1.00 mg/dL   Calcium 8.4 (L) 8.9 - 10.3 mg/dL   Total Protein 6.1 (L) 6.5 - 8.1 g/dL   Albumin 2.6 (L) 3.5 - 5.0 g/dL   AST 14 (L) 15 - 41 U/L   ALT 12 0 - 44 U/L   Alkaline Phosphatase 59 38 - 126 U/L   Total Bilirubin 0.2 0.0 - 1.2 mg/dL   GFR, Estimated >46 >96 mL/min    Comment: (NOTE) Calculated using the CKD-EPI Creatinine Equation (2021)    Anion gap 9 5 - 15    Comment: Performed at [facility], [address], [place], [place] [address]  CBC     Status: Abnormal   Collection Time: 04/28/23  5:00 AM  Result Value Ref Range   WBC 8.8 4.0 - 10.5 K/uL   RBC 3.78 (L) 3.87 - 5.11 MIL/uL   Hemoglobin 11.1 (L) 12.0 - 15.0 g/dL   HCT 41.3 (L) 24.4 - 01.0 %   MCV 87.8 80.0 - 100.0 fL   MCH 29.4 26.0 - 34.0 pg   MCHC 33.4 30.0 - 36.0 g/dL   RDW 27.2 53.6 - 64.4 %   Platelets 143 (L) 150 -  400 K/uL   nRBC 1.3 (H) 0.0 - 0.2 %    Comment: Performed at [facility], [address] [place], [place] [address]  Comprehensive metabolic panel     Status: Abnormal   Collection Time: 04/28/23  5:00 AM  Result Value Ref Range   Sodium 136 135 - 145 mmol/L   Potassium 3.4 (L) 3.5 - 5.1 mmol/L   Chloride 106 98 - 111 mmol/L   CO2 23 22 - 32 mmol/L   Glucose, Bld 117 (H) 70 - 99 mg/dL    Comment: Glucose reference range applies only to samples taken after fasting for at least 8 hours.   BUN 6 6 - 20 mg/dL   Creatinine, Ser 2.84 0.44 - 1.00 mg/dL   Calcium 8.7 (L) 8.9 - 10.3 mg/dL   Total Protein 6.0 (L) 6.5 - 8.1 g/dL   Albumin 2.7 (L) 3.5 - 5.0 g/dL   AST 16 15 - 41 U/L   ALT 11 0 - 44 U/L   Alkaline Phosphatase 59 38 - 126 U/L   Total Bilirubin 0.4 0.0 - 1.2 mg/dL   GFR, Estimated >13 >24 mL/min    Comment: (NOTE) Calculated using the CKD-EPI Creatinine Equation (2021)    Anion gap 7 5 - 15    Comment: Performed at [facility], [address],  [place], [place] [address]    No results found.   Current scheduled medications  aspirin  162 mg Oral Daily   docusate sodium  100 mg Oral Daily   NIFEdipine  60 mg Oral q AM   NIFEdipine  30 mg Oral QPM   prenatal multivitamin  1 tablet Oral Q1200   sodium chloride flush  3-10 mL Intravenous Q12H   valACYclovir  500 mg Oral BID    I have reviewed the patient's current medications.  ASSESSMENT: Principal Problem:   Vaginal bleeding in pregnancy, third trimester Active Problems:   History of severe pre-eclampsia   Chronic hypertension   Chronic hypertension in pregnancy   Cervical cerclage suture present in third trimester   Obesity in pregnancy   IUGR (intrauterine growth restriction) affecting care of mother, third trimester   History of C-section x 2   PLAN: #. Vaginal bleeding - Bleed #2 on 3/5, suspect marginal abruption - No current bleeding - Maintain cerclage, no e/o PTL at this time - BID NST - will get BPP this afternoon for NRNST - Plan to monitor for 5-7 days post bleed  #. Worsening cHTN - Asymptomatic - 1 SR BP in MFM office 3/6, mild range BP since admission - Increase to procardia 60/30 given persistent MR BPs - P/C 0.07 on admission. Plt returned to 140s, continue daily labs - defer magnesium for seizure ppx, is not meeting criteria for SIPE w/ SF  #. FGR/FWB - @33 /2 1589g (1.5%), AC <1%, cephalic, anterior, 11.79, 4/8 (movement & tone), normal UAD > repeat BPP 8/8 - BID NSTs, next BPP/UAD due 3/13 - s/p magnesium, BMZ & rescue BMZ 3/6-7 - s/p NICU consult in prior admission (1/9)  #. Prior CS x2, request for permanent sterilization For repeat with tubal Tubal papers signed 2/13  #. HSV Valtrex ppx  #.Routine No 2h Accuchecks fasting & 2h PP Values have been pan elevated, DM coordinator consulted and SSI added  Continue routine antenatal care.  Harvie Bridge, MD Obstetrician & Gynecologist, [facility] for [employer], [facility]

## 2023-04-29 ENCOUNTER — Encounter: Payer: MEDICAID | Admitting: Family Medicine

## 2023-04-29 DIAGNOSIS — O4693 Antepartum hemorrhage, unspecified, third trimester: Secondary | ICD-10-CM

## 2023-04-29 DIAGNOSIS — O10919 Unspecified pre-existing hypertension complicating pregnancy, unspecified trimester: Secondary | ICD-10-CM | POA: Diagnosis not present

## 2023-04-29 DIAGNOSIS — O10913 Unspecified pre-existing hypertension complicating pregnancy, third trimester: Secondary | ICD-10-CM

## 2023-04-29 DIAGNOSIS — Z3A33 33 weeks gestation of pregnancy: Secondary | ICD-10-CM | POA: Diagnosis not present

## 2023-04-29 DIAGNOSIS — Z8759 Personal history of other complications of pregnancy, childbirth and the puerperium: Secondary | ICD-10-CM

## 2023-04-29 DIAGNOSIS — O1413 Severe pre-eclampsia, third trimester: Secondary | ICD-10-CM | POA: Diagnosis not present

## 2023-04-29 DIAGNOSIS — O3433 Maternal care for cervical incompetence, third trimester: Secondary | ICD-10-CM

## 2023-04-29 DIAGNOSIS — O365931 Maternal care for other known or suspected poor fetal growth, third trimester, fetus 1: Secondary | ICD-10-CM

## 2023-04-29 DIAGNOSIS — Z98891 History of uterine scar from previous surgery: Secondary | ICD-10-CM

## 2023-04-29 DIAGNOSIS — O99210 Obesity complicating pregnancy, unspecified trimester: Secondary | ICD-10-CM

## 2023-04-29 LAB — COMPREHENSIVE METABOLIC PANEL
ALT: 11 U/L (ref 0–44)
AST: 16 U/L (ref 15–41)
Albumin: 2.6 g/dL — ABNORMAL LOW (ref 3.5–5.0)
Alkaline Phosphatase: 65 U/L (ref 38–126)
Anion gap: 8 (ref 5–15)
BUN: 5 mg/dL — ABNORMAL LOW (ref 6–20)
CO2: 22 mmol/L (ref 22–32)
Calcium: 8.5 mg/dL — ABNORMAL LOW (ref 8.9–10.3)
Chloride: 106 mmol/L (ref 98–111)
Creatinine, Ser: 0.59 mg/dL (ref 0.44–1.00)
GFR, Estimated: >60 mL/min (ref >60)
Glucose, Bld: 98 mg/dL (ref 70–99)
Potassium: 3.6 mmol/L (ref 3.5–5.1)
Sodium: 136 mmol/L (ref 135–145)
Total Bilirubin: 0.5 mg/dL (ref 0.0–1.2)
Total Protein: 6.1 g/dL — ABNORMAL LOW (ref 6.5–8.1)

## 2023-04-29 LAB — GLUCOSE, CAPILLARY
Glucose-Capillary: 106 mg/dL — ABNORMAL HIGH (ref 70–99)
Glucose-Capillary: 109 mg/dL — ABNORMAL HIGH (ref 70–99)
Glucose-Capillary: 116 mg/dL — ABNORMAL HIGH (ref 70–99)
Glucose-Capillary: 126 mg/dL — ABNORMAL HIGH (ref 70–99)
Glucose-Capillary: 131 mg/dL — ABNORMAL HIGH (ref 70–99)
Glucose-Capillary: 141 mg/dL — ABNORMAL HIGH (ref 70–99)
Glucose-Capillary: 75 mg/dL (ref 70–99)
Glucose-Capillary: 95 mg/dL (ref 70–99)
Glucose-Capillary: 85 mg/dL (ref 70–99)

## 2023-04-29 LAB — CBC
HCT: 33.5 % — ABNORMAL LOW (ref 36.0–46.0)
Hemoglobin: 11.3 g/dL — ABNORMAL LOW (ref 12.0–15.0)
MCH: 29.6 pg (ref 26.0–34.0)
MCHC: 33.7 g/dL (ref 30.0–36.0)
MCV: 87.7 fL (ref 80.0–100.0)
Platelets: 143 10*3/uL — ABNORMAL LOW (ref 150–400)
RBC: 3.82 MIL/uL — ABNORMAL LOW (ref 3.87–5.11)
RDW: 13 % (ref 11.5–15.5)
WBC: 8.3 10*3/uL (ref 4.0–10.5)
nRBC: 0.8 % — ABNORMAL HIGH (ref 0.0–0.2)

## 2023-04-29 MED ORDER — MAGNESIUM SULFATE 2 GM/50ML IV SOLN
2.0000 g | Freq: Once | INTRAVENOUS | Status: DC
  Filled 2023-04-29: qty 50

## 2023-04-29 MED ORDER — MAGNESIUM SULFATE BOLUS VIA INFUSION
4.0000 g | Freq: Once | INTRAVENOUS | Status: AC
  Administered 2023-04-29: 4 g via INTRAVENOUS
  Filled 2023-04-29: qty 1000

## 2023-04-29 MED ORDER — MAGNESIUM SULFATE 40 GM/1000ML IV SOLN
2.0000 g/h | INTRAVENOUS | Status: AC
  Administered 2023-04-30 – 2023-05-01 (×3): 2 g/h via INTRAVENOUS
  Filled 2023-04-29 (×3): qty 1000

## 2023-04-29 MED ORDER — LACTATED RINGERS IV SOLN: INTRAVENOUS | Status: DC

## 2023-04-29 NOTE — Progress Notes (Signed)
 Patient ID: Margaret Ingram, female   DOB: 28-Jan-1996, 28 y.o.   MRN: [id number] FACULTY PRACTICE ANTEPARTUM COMPREHENSIVE PROGRESS NOTE  Margaret Ingram is a 28 y.o. [id number] at [redacted]w[redacted]d with hx prior CS x2, cerclage in place and FGR (1.5%, nml UAD) who is admitted for vaginal bleeding, BPP 6/10, and worsening cHTN .  Estimated Date of Delivery: 06/11/23 Fetal presentation is cephalic.  Length of Stay:  4 Days. Admitted 04/25/2023  Subjective: Patient reports feeling well  and has no complaints. Denies HA, visual changes, CP/SOB, RUQ/epigastric pain. She denies any vaginal bleeding, contractions or leakage of fluid. She reports good fetal movement.  Vitals:  Blood pressure 130/70, pulse 92, temperature 98.6 F (37 C), temperature source Oral, resp. rate 18, height 5\' 2"  (1.575 m), weight 126.1 kg, last menstrual period 09/04/2022, SpO2 100%. Physical Examination: CONSTITUTIONAL: No acute distress, well appearing CARDIOVASCULAR: Normal heart rate noted, RESPIRATORY: Effort normal, no problems with respiration noted ABDOMEN: Soft, nontender, nondistended, gravid.  NST 130bpm, moderate variability, 15x15, no decels Toco flat  Results for orders placed or performed during the hospital encounter of 04/25/23 (from the past 48 hours)  CBC     Status: Abnormal   Collection Time: 04/28/23  5:00 AM  Result Value Ref Range   WBC 8.8 4.0 - 10.5 K/uL   RBC 3.78 (L) 3.87 - 5.11 MIL/uL   Hemoglobin 11.1 (L) 12.0 - 15.0 g/dL   HCT 14.7 (L) 82.9 - 56.2 %   MCV 87.8 80.0 - 100.0 fL   MCH 29.4 26.0 - 34.0 pg   MCHC 33.4 30.0 - 36.0 g/dL   RDW 13.0 86.5 - 78.4 %   Platelets 143 (L) 150 - 400 K/uL   nRBC 1.3 (H) 0.0 - 0.2 %    Comment: Performed at [facility], [address], [place], [place] [address]  Comprehensive metabolic panel     Status: Abnormal   Collection Time: 04/28/23  5:00 AM  Result Value Ref Range   Sodium 136 135 - 145 mmol/L   Potassium 3.4 (L) 3.5 - 5.1 mmol/L   Chloride  106 98 - 111 mmol/L   CO2 23 22 - 32 mmol/L   Glucose, Bld 117 (H) 70 - 99 mg/dL    Comment: Glucose reference range applies only to samples taken after fasting for at least 8 hours.   BUN 6 6 - 20 mg/dL   Creatinine, Ser 5.28 0.44 - 1.00 mg/dL   Calcium 8.7 (L) 8.9 - 10.3 mg/dL   Total Protein 6.0 (L) 6.5 - 8.1 g/dL   Albumin 2.7 (L) 3.5 - 5.0 g/dL   AST 16 15 - 41 U/L   ALT 11 0 - 44 U/L   Alkaline Phosphatase 59 38 - 126 U/L   Total Bilirubin 0.4 0.0 - 1.2 mg/dL   GFR, Estimated >41 >32 mL/min    Comment: (NOTE) Calculated using the CKD-EPI Creatinine Equation (2021)    Anion gap 7 5 - 15    Comment: Performed at [facility], [address] [place], [place] [address]  CBC     Status: Abnormal   Collection Time: 04/29/23  4:18 AM  Result Value Ref Range   WBC 8.3 4.0 - 10.5 K/uL   RBC 3.82 (L) 3.87 - 5.11 MIL/uL   Hemoglobin 11.3 (L) 12.0 - 15.0 g/dL   HCT 27.2 (L) 53.6 - 64.4 %   MCV 87.7 80.0 - 100.0 fL   MCH 29.6 26.0 - 34.0 pg  MCHC 33.7 30.0 - 36.0 g/dL   RDW 47.8 29.5 - 62.1 %   Platelets 143 (L) 150 - 400 K/uL   nRBC 0.8 (H) 0.0 - 0.2 %    Comment: Performed at [facility], [address], [place], [place] [address]  Comprehensive metabolic panel     Status: Abnormal   Collection Time: 04/29/23  4:18 AM  Result Value Ref Range   Sodium 136 135 - 145 mmol/L   Potassium 3.6 3.5 - 5.1 mmol/L   Chloride 106 98 - 111 mmol/L   CO2 22 22 - 32 mmol/L   Glucose, Bld 98 70 - 99 mg/dL    Comment: Glucose reference range applies only to samples taken after fasting for at least 8 hours.   BUN 5 (L) 6 - 20 mg/dL   Creatinine, Ser 7.84 0.44 - 1.00 mg/dL   Calcium 8.5 (L) 8.9 - 10.3 mg/dL   Total Protein 6.1 (L) 6.5 - 8.1 g/dL   Albumin 2.6 (L) 3.5 - 5.0 g/dL   AST 16 15 - 41 U/L   ALT 11 0 - 44 U/L   Alkaline Phosphatase 65 38 - 126 U/L   Total Bilirubin 0.5 0.0 - 1.2 mg/dL   GFR, Estimated >69 >62 mL/min    Comment: (NOTE) Calculated using the CKD-EPI  Creatinine Equation (2021)    Anion gap 8 5 - 15    Comment: Performed at [facility], [address] [place], [place] [address]    [place] MFM FETAL BPP WO NON STRESS Result Date: 04/28/2023 ----------------------------------------------------------------------  OBSTETRICS REPORT                       (Signed Final 04/28/2023 05:16 pm) ---------------------------------------------------------------------- Patient Info  ID #:       [id number]                          D.O.B.:  04-Nov-1995 (28 yrs)(F)  Name:       Margaret Ingram               Visit Date: 04/28/2023 03:00 pm ---------------------------------------------------------------------- Performed By  Attending:        Noralee Space MD        Secondary Phy.:    Lennart Pall MD  Performed By:     Louie Casa       Address:           [address]                    [place]  Referred By:      [facility]          [facility]  Ref. Address:     [address]                    [place], [place]                    [address] ---------------------------------------------------------------------- Orders  #  Description                           Code        Ordered By  1  [place] MFM FETAL BPP WO NON               76819.01    [facility]     STRESS ----------------------------------------------------------------------  #  Order #                     Accession #                Episode #  1  [id number]                   [id number]                 [id number] ---------------------------------------------------------------------- Indications  Non-reactive NST                                O28.9  Hypertension - Chronic/Pre-existing             O10.019  Maternal care for known or suspected poor       O36.5931  fetal growth, third trimester, fetus 1 IUGR  Obesity complicating pregnancy, third           O99.213   trimester  History of pre-term deliveries                  O09.219  Cervical cerclage suture present, third         O34.33  trimester  Vaginal bleeding in pregnancy, third trimester  O46.93  [redacted] weeks gestation of pregnancy                 Z3A.33 ---------------------------------------------------------------------- Vital Signs  Weight (lb): 278                               Height:        5'2"  BMI:         50.84 ---------------------------------------------------------------------- Fetal Evaluation  Num Of Fetuses:          1  Fetal Heart Rate(bpm):   134  Cardiac Activity:        Observed  Presentation:            Cephalic  Placenta:                Anterior Fundal  P. Cord Insertion:       Previously seen  Amniotic Fluid  AFI FV:      Within normal limits  AFI Sum(cm)     %Tile       Largest Pocket(cm)  14.3            50          5.1  RUQ(cm)       RLQ(cm)       LUQ(cm)        LLQ(cm)  4.2  5.1           2              3  ---------------------------------------------------------------------- Biophysical Evaluation  Amniotic F.V:   Pocket => 2 cm             F. Tone:         Observed  F. Movement:    Observed                   Score:           8/8  F. Breathing:   Observed ---------------------------------------------------------------------- OB History  Blood Type:   A+  Gravidity:    6         Term:   2        Prem:   3  Living:       4 ---------------------------------------------------------------------- Gestational Age  Clinical EDD:  33w 5d                                        EDD:   06/11/23  Best:          33w 5d     Det. By:  Clinical EDD             EDD:   06/11/23 ---------------------------------------------------------------------- Anatomy  Ventricles:            Not well visualized    Stomach:                Appears normal, left                                                                        sided  Heart:                 Previously seen        Kidneys:                Not well  visualized  Diaphragm:             Appears normal         Bladder:                Appears normal ---------------------------------------------------------------------- Impression  BPP was requested because of nonreactive NST.  Amniotic fluid is normal and good fetal activity seen.  Cephalic presentation.  Antenatal testing is reassuring.  BPP  8/8. ----------------------------------------------------------------------                 Noralee Space, MD Electronically Signed Final Report   04/28/2023 05:16 pm ----------------------------------------------------------------------     Current scheduled medications  aspirin  162 mg Oral Daily   docusate sodium  100 mg Oral Daily   NIFEdipine  90 mg Oral q AM   prenatal multivitamin  1 tablet Oral Q1200   sodium chloride flush  3-10 mL Intravenous Q12H   valACYclovir  500 mg Oral BID    I have reviewed the patient's current medications.  ASSESSMENT: Principal Problem:   Vaginal bleeding in pregnancy, third trimester Active Problems:   History of severe pre-eclampsia   Chronic hypertension   Chronic  hypertension in pregnancy   Cervical cerclage suture present in third trimester   Obesity in pregnancy   IUGR (intrauterine growth restriction) affecting care of mother, third trimester   History of C-section x 2   PLAN: 28 yo [id number] at 33weeks 6 days  Worsening cHTN - Asymptomatic - Severe range BP this morning on procardia 90 mg daily - Will start magnesium sulfate for seizure prophylaxis - Plan for delivery tomorrow at 34 weeks  FGR/FWB - @33 /2 1589g (1.5%), AC <1%, cephalic, anterior, 11.79, 4/8 (movement & tone), normal UAD > repeat BPP 8/8 - s/p magnesium, BMZ & rescue BMZ 3/6-7 - s/p NICU consult in prior admission (1/9). Patient requesting NICU consult in light of eminent delivery in the setting of IUGR  Prior CS x2, request for permanent sterilization Plan for repeat c-section with BTL Tubal papers signed 2/13  Continue  routine antenatal care.  Catalina Antigua, MD Obstetrician & Gynecologist, [facility] for [employer], [facility]

## 2023-04-29 NOTE — Anesthesia Preprocedure Evaluation (Signed)
 Anesthesia Evaluation  Patient identified by MRN, date of birth, ID band Patient awake    Reviewed: Allergy & Precautions, NPO status , Patient's Chart, lab work & pertinent test results  History of Anesthesia Complications Negative for: history of anesthetic complications  Airway Mallampati: III  TM Distance: >3 FB Neck ROM: Full    Dental   Pulmonary neg shortness of breath, asthma (no recent flares, does not use inhalers) , neg sleep apnea, neg COPD, neg recent URI   Pulmonary exam normal breath sounds clear to auscultation       Cardiovascular hypertension (chronic HTN with SIPE with severe features), (-) angina + Valvular Problems/Murmurs  Rhythm:Regular Rate:Normal     Neuro/Psych  Headaches, Seizures -,  PSYCHIATRIC DISORDERS Anxiety Depression       GI/Hepatic negative GI ROS, Neg liver ROS,,,  Endo/Other    Class 4 obesityPre-diabetes  Renal/GU negative Renal ROS     Musculoskeletal   Abdominal  (+) + obese  Peds  Hematology negative hematology ROS (+) Lab Results      Component                Value               Date                      WBC                      7.9                 04/30/2023                HGB                      11.5 (L)            04/30/2023                HCT                      35.1 (L)            04/30/2023                MCV                      88.6                04/30/2023                PLT                      174                 04/30/2023               Anesthesia Other Findings H/o c-section x2  Patient has a nose ring and a tongue ring that she states that she cannot get out. The tongue ring is on the tip of the tongue with a small ball on each side. There is nothing on the top of her tongue or the center of the tip of her tongue. It does not appear that it would be in the way if we had to intubate, but patient made aware that there is a risk that it could impede [place]  maintaining and protecting her airway during an emergency. The  patient understood this risk and stated that she was willing to accept that risk as she has never taken it out and was unable to do so. She was also worried that the holes would close up. I informed her that I'd be more worried about losing her airway than her holes closing up. She is accepting of the risk.  Reproductive/Obstetrics (+) Pregnancy                             Anesthesia Physical Anesthesia Plan  ASA: 3  Anesthesia Plan: Spinal and Epidural   Post-op Pain Management:    Induction:   PONV Risk Score and Plan: Ondansetron, Dexamethasone and Treatment may vary due to age or medical condition  Airway Management Planned: Natural Airway  Additional Equipment:   Intra-op Plan:   Post-operative Plan: Extubation in OR  Informed Consent: I have reviewed the patients History and Physical, chart, labs and discussed the procedure including the risks, benefits and alternatives for the proposed anesthesia with the patient or authorized representative who has indicated his/her understanding and acceptance.     Dental advisory given  Plan Discussed with: CRNA and Anesthesiologist  Anesthesia Plan Comments: (I have discussed risks of neuraxial anesthesia including but not limited to infection, bleeding, nerve injury, back pain, headache, seizures, and failure of block. Patient denies bleeding disorders and is not currently anticoagulated. Labs have been reviewed. Risks and benefits discussed. All patient's questions answered.  )        Anesthesia Quick Evaluation

## 2023-04-29 NOTE — Consult Note (Signed)
 MFM Consult Note  Raylea Adcox is currently at 33 weeks and 6 days.    She has been hospitalized due to worsening chronic hypertension with probable superimposed preeclampsia and vaginal bleeding.    Her pregnancy has also been complicated by an IUGR fetus.  Her most recent growth scan performed 4 days ago showed an EFW of 3 pounds 8 ounces (2nd percentile indicating severe IUGR).    The patient's blood pressures remain severely elevated despite treatment with Procardia 90 mg daily.    She reports that she is doing well and denies any signs or symptoms of preeclampsia.    She has already received 2 courses (initial and rescue) of antenatal corticosteroids.    Her fetal status has been reassuring.  Her PIH labs have been within normal limits, other than a borderline low platelet count of 143,000.   Due to the diagnosis of preeclampsia with severe features given her severe range blood pressures despite treatment with a high dose of antihypertensive medication along with severe IUGR, delivery is recommended at around 34 weeks.    The patient is already scheduled for a repeat cesarean delivery and bilateral tubal ligation tomorrow.    The patient understands that her baby will require a NICU admission following delivery.    She is excited to proceed with delivery tomorrow and stated that all of her questions were answered today.

## 2023-04-29 NOTE — Inpatient Diabetes Management (Addendum)
 Inpatient Diabetes Program Recommendations  ADA Standards of Care 2025 Diabetes in Pregnancy Target Glucose Ranges:  Fasting: 70 - 95 mg/dL 1 hr postprandial:  413 - 140mg /dL (from first bite of meal) 2 hr postprandial:  100 - 120 mg/dL (from first bit of meal)    Lab Results  Component Value Date   GLUCAP 141 (H) 04/29/2023    Review of Glycemic Control  Latest Reference Range & Units 04/27/23 23:49 04/28/23 05:39 04/28/23 13:03 04/28/23 18:32 04/29/23 05:42  Glucose-Capillary 70 - 99 mg/dL 244 (H) 010 (H) 75 272 (H) 141 (H)   Diabetes history: DM-gestational Outpatient Diabetes medications:  None Current orders for Inpatient glycemic control:  None Inpatient Diabetes Program Recommendations:   Currently blood sugars are mostly within goal.  Will follow.   Thanks,  Lorenza Cambridge, RN, BC-ADM Inpatient Diabetes Coordinator Pager [phone]  (8a-5p)

## 2023-04-29 NOTE — Consult Note (Signed)
 Neonatology Prenatal Consult       04/29/2023  3:25 PM   I was asked by Dr. Jolayne Panther to consult on this patient for impending preterm delivery.  I had the pleasure of meeting with Ms. Streight again today, having previously consulted on her when we was admitted at 25+[redacted] weeks gestation with vaginal bleeding and threatened preterm labor.  She is a [id number] at now 33+[redacted] weeks gestation, currently admitted again for vaginal bleeding and worsening cHTN concerning for superimposed pre-eclampsia.  She is betamethasone mature and there is plan for c-section delivery tomorrow at 34 weeks.   I explained that the neonatal intensive care team would be present for the delivery and outlined the likely delivery room course for this baby including routine resuscitation and NRP-guided approaches to the treatment of respiratory distress. We discussed other common problems associated with prematurity including respiratory distress syndrome/CLD, apnea, feeding issues, temperature regulation, and infection risk.  We reviewed how a 34 weeker's NICU course would differ from  her prior 28 week delivery; now with significantly lower risk of complications and likely a 2-4 weeks stay primarily to work on oral feeding skills.  Mom voiced insight to her own "trauma" from her prior NICU baby coming home and then passing away from SIDS.  I encouraged her that we wanted to support her emotionally through this process too.    We discussed the average length of stay but I noted that the actual LOS would depend on the severity of problems encountered and response to treatments.  We discussed visitation policies and the resources available while her child is in the hospital.  We discussed the importance of good nutrition and various methods of providing nutrition (parenteral hyperalimentation, gavage feedings and/or oral feeding). We discussed the benefits of human milk. I encouraged breast feeding and pumping soon after birth and outlined  resources that are available to support breast feeding.    Thank you for involving [place] in the care of this patient. A member of our team will be available should the family have additional questions.  Time for consultation approximately 20 minutes.   _____________________ Electronically Signed By: Karie Schwalbe, MD, MS Neonatologist

## 2023-04-30 ENCOUNTER — Inpatient Hospital Stay (HOSPITAL_COMMUNITY): Payer: MEDICAID | Admitting: Anesthesiology

## 2023-04-30 ENCOUNTER — Encounter (HOSPITAL_COMMUNITY)
Admission: AD | Disposition: A | Discharge status: Home / Self Care | Payer: Self-pay | Source: Home / Self Care | Attending: Obstetrics and Gynecology

## 2023-04-30 ENCOUNTER — Encounter (HOSPITAL_COMMUNITY): Payer: Self-pay | Admitting: Obstetrics & Gynecology

## 2023-04-30 ENCOUNTER — Other Ambulatory Visit: Payer: Self-pay

## 2023-04-30 DIAGNOSIS — O1414 Severe pre-eclampsia complicating childbirth: Secondary | ICD-10-CM

## 2023-04-30 DIAGNOSIS — Z9079 Acquired absence of other genital organ(s): Secondary | ICD-10-CM

## 2023-04-30 DIAGNOSIS — Z3A34 34 weeks gestation of pregnancy: Secondary | ICD-10-CM

## 2023-04-30 DIAGNOSIS — O34211 Maternal care for low transverse scar from previous cesarean delivery: Secondary | ICD-10-CM

## 2023-04-30 DIAGNOSIS — O99214 Obesity complicating childbirth: Secondary | ICD-10-CM | POA: Diagnosis not present

## 2023-04-30 DIAGNOSIS — Z302 Encounter for sterilization: Secondary | ICD-10-CM

## 2023-04-30 DIAGNOSIS — O365930 Maternal care for other known or suspected poor fetal growth, third trimester, not applicable or unspecified: Secondary | ICD-10-CM | POA: Diagnosis not present

## 2023-04-30 DIAGNOSIS — O4693 Antepartum hemorrhage, unspecified, third trimester: Secondary | ICD-10-CM | POA: Diagnosis not present

## 2023-04-30 DIAGNOSIS — Z98891 History of uterine scar from previous surgery: Principal | ICD-10-CM

## 2023-04-30 DIAGNOSIS — O9832 Other infections with a predominantly sexual mode of transmission complicating childbirth: Secondary | ICD-10-CM

## 2023-04-30 HISTORY — DX: Acquired absence of other genital organ(s): Z90.79

## 2023-04-30 LAB — GLUCOSE, CAPILLARY
Glucose-Capillary: 101 mg/dL — ABNORMAL HIGH (ref 70–99)
Glucose-Capillary: 119 mg/dL — ABNORMAL HIGH (ref 70–99)
Glucose-Capillary: 100 mg/dL — ABNORMAL HIGH (ref 70–99)

## 2023-04-30 LAB — CBC
HCT: 35.1 % — ABNORMAL LOW (ref 36.0–46.0)
Hemoglobin: 11.5 g/dL — ABNORMAL LOW (ref 12.0–15.0)
MCH: 29 pg (ref 26.0–34.0)
MCHC: 32.8 g/dL (ref 30.0–36.0)
MCV: 88.6 fL (ref 80.0–100.0)
Platelets: 174 10*3/uL (ref 150–400)
RBC: 3.96 MIL/uL (ref 3.87–5.11)
RDW: 12.8 % (ref 11.5–15.5)
WBC: 7.9 10*3/uL (ref 4.0–10.5)
nRBC: 0 % (ref 0.0–0.2)

## 2023-04-30 LAB — COMPREHENSIVE METABOLIC PANEL
ALT: 13 U/L (ref 0–44)
AST: 19 U/L (ref 15–41)
Albumin: 2.9 g/dL — ABNORMAL LOW (ref 3.5–5.0)
Alkaline Phosphatase: 71 U/L (ref 38–126)
Anion gap: 7 (ref 5–15)
BUN: 6 mg/dL (ref 6–20)
CO2: 22 mmol/L (ref 22–32)
Calcium: 7.4 mg/dL — ABNORMAL LOW (ref 8.9–10.3)
Chloride: 106 mmol/L (ref 98–111)
Creatinine, Ser: 0.58 mg/dL (ref 0.44–1.00)
GFR, Estimated: >60 mL/min (ref >60)
Glucose, Bld: 121 mg/dL — ABNORMAL HIGH (ref 70–99)
Potassium: 3.8 mmol/L (ref 3.5–5.1)
Sodium: 135 mmol/L (ref 135–145)
Total Bilirubin: 0.4 mg/dL (ref 0.0–1.2)
Total Protein: 6.5 g/dL (ref 6.5–8.1)

## 2023-04-30 LAB — MAGNESIUM: Magnesium: 4.2 mg/dL — ABNORMAL HIGH (ref 1.7–2.4)

## 2023-04-30 LAB — TYPE AND SCREEN
ABO/RH(D): A POS
Antibody Screen: NEGATIVE

## 2023-04-30 SURGERY — Surgical Case
Anesthesia: Spinal | Site: Cervix

## 2023-04-30 MED ORDER — PRENATAL MULTIVITAMIN CH
1.0000 | ORAL_TABLET | Freq: Every day | ORAL | Status: DC
  Administered 2023-05-01 – 2023-05-03 (×3): 1 via ORAL
  Filled 2023-04-30 (×3): qty 1

## 2023-04-30 MED ORDER — LACTATED RINGERS IV SOLN: INTRAVENOUS | Status: AC

## 2023-04-30 MED ORDER — OXYCODONE HCL 5 MG PO TABS
5.0000 mg | ORAL_TABLET | ORAL | Status: DC | PRN
  Administered 2023-05-01: 5 mg via ORAL
  Administered 2023-05-01 (×2): 10 mg via ORAL
  Administered 2023-05-02 (×2): 5 mg via ORAL
  Administered 2023-05-02 (×3): 10 mg via ORAL
  Administered 2023-05-03: 5 mg via ORAL
  Administered 2023-05-03 (×2): 10 mg via ORAL
  Filled 2023-04-30: qty 1
  Filled 2023-04-30 (×5): qty 2
  Filled 2023-04-30: qty 1
  Filled 2023-04-30: qty 2
  Filled 2023-04-30: qty 1
  Filled 2023-04-30 (×2): qty 2
  Filled 2023-04-30: qty 1

## 2023-04-30 MED ORDER — DEXMEDETOMIDINE HCL IN NACL 80 MCG/20ML IV SOLN
INTRAVENOUS | Status: DC | PRN
  Administered 2023-04-30 (×5): 8 ug via INTRAVENOUS

## 2023-04-30 MED ORDER — ACETAMINOPHEN 500 MG PO TABS
1000.0000 mg | ORAL_TABLET | Freq: Four times a day (QID) | ORAL | Status: DC
  Administered 2023-04-30 – 2023-05-03 (×11): 1000 mg via ORAL
  Filled 2023-04-30 (×12): qty 2

## 2023-04-30 MED ORDER — FENTANYL CITRATE (PF) 100 MCG/2ML IJ SOLN
INTRAMUSCULAR | Status: DC | PRN
  Administered 2023-04-30: 15 ug via INTRATHECAL

## 2023-04-30 MED ORDER — PHENYLEPHRINE HCL-NACL 20-0.9 MG/250ML-% IV SOLN
INTRAVENOUS | Status: DC | PRN
  Administered 2023-04-30: 60 ug/min via INTRAVENOUS

## 2023-04-30 MED ORDER — OXYTOCIN-SODIUM CHLORIDE 30-0.9 UT/500ML-% IV SOLN
INTRAVENOUS | Status: DC | PRN
  Administered 2023-04-30: 300 mL via INTRAVENOUS

## 2023-04-30 MED ORDER — SENNOSIDES-DOCUSATE SODIUM 8.6-50 MG PO TABS
2.0000 | ORAL_TABLET | Freq: Every day | ORAL | Status: DC
  Administered 2023-05-01 – 2023-05-03 (×3): 2 via ORAL
  Filled 2023-04-30 (×3): qty 2

## 2023-04-30 MED ORDER — MORPHINE SULFATE (PF) 0.5 MG/ML IJ SOLN
INTRAMUSCULAR | Status: AC
  Filled 2023-04-30: qty 10

## 2023-04-30 MED ORDER — FENTANYL CITRATE (PF) 100 MCG/2ML IJ SOLN
INTRAMUSCULAR | Status: AC
  Filled 2023-04-30: qty 2

## 2023-04-30 MED ORDER — MIDAZOLAM HCL 2 MG/2ML IJ SOLN
INTRAMUSCULAR | Status: AC
  Filled 2023-04-30: qty 2

## 2023-04-30 MED ORDER — BUPIVACAINE IN DEXTROSE 0.75-8.25 % IT SOLN
INTRATHECAL | Status: DC | PRN
  Administered 2023-04-30: 1.6 mL via INTRATHECAL

## 2023-04-30 MED ORDER — ONDANSETRON HCL 4 MG/2ML IJ SOLN
INTRAMUSCULAR | Status: AC
  Filled 2023-04-30: qty 2

## 2023-04-30 MED ORDER — KETOROLAC TROMETHAMINE 30 MG/ML IJ SOLN
30.0000 mg | Freq: Four times a day (QID) | INTRAMUSCULAR | Status: AC
  Administered 2023-04-30 – 2023-05-01 (×4): 30 mg via INTRAVENOUS
  Filled 2023-04-30 (×4): qty 1

## 2023-04-30 MED ORDER — LIDOCAINE-EPINEPHRINE (PF) 2 %-1:200000 IJ SOLN
INTRAMUSCULAR | Status: DC | PRN
  Administered 2023-04-30: 4 mL via INTRADERMAL
  Administered 2023-04-30: 2 mL via INTRADERMAL

## 2023-04-30 MED ORDER — SIMETHICONE 80 MG PO CHEW
80.0000 mg | CHEWABLE_TABLET | ORAL | Status: DC | PRN
  Administered 2023-05-01 (×2): 80 mg via ORAL
  Filled 2023-04-30 (×2): qty 1

## 2023-04-30 MED ORDER — FENTANYL CITRATE (PF) 100 MCG/2ML IJ SOLN
INTRAMUSCULAR | Status: DC | PRN
  Administered 2023-04-30: 85 ug via INTRAVENOUS

## 2023-04-30 MED ORDER — SOD CITRATE-CITRIC ACID 500-334 MG/5ML PO SOLN
ORAL | Status: AC
  Administered 2023-04-30: 30 mL
  Filled 2023-04-30: qty 30

## 2023-04-30 MED ORDER — ENOXAPARIN SODIUM 60 MG/0.6ML IJ SOSY
60.0000 mg | PREFILLED_SYRINGE | INTRAMUSCULAR | Status: DC
  Administered 2023-05-01 – 2023-05-03 (×3): 60 mg via SUBCUTANEOUS
  Filled 2023-04-30 (×4): qty 0.6

## 2023-04-30 MED ORDER — ONDANSETRON HCL 4 MG/2ML IJ SOLN
INTRAMUSCULAR | Status: DC | PRN
  Administered 2023-04-30: 4 mg via INTRAVENOUS

## 2023-04-30 MED ORDER — FUROSEMIDE 20 MG PO TABS
20.0000 mg | ORAL_TABLET | Freq: Every day | ORAL | Status: DC
  Administered 2023-05-01 – 2023-05-03 (×3): 20 mg via ORAL
  Filled 2023-04-30 (×3): qty 1

## 2023-04-30 MED ORDER — DIPHENHYDRAMINE HCL 25 MG PO CAPS: 25.0000 mg | ORAL_CAPSULE | Freq: Four times a day (QID) | ORAL | Status: DC | PRN

## 2023-04-30 MED ORDER — NIFEDIPINE ER OSMOTIC RELEASE 60 MG PO TB24
90.0000 mg | ORAL_TABLET | Freq: Every day | ORAL | Status: DC
  Administered 2023-05-02 – 2023-05-03 (×2): 90 mg via ORAL
  Filled 2023-04-30 (×3): qty 1

## 2023-04-30 MED ORDER — OXYTOCIN-SODIUM CHLORIDE 30-0.9 UT/500ML-% IV SOLN: 2.5000 [IU]/h | INTRAVENOUS | Status: AC

## 2023-04-30 MED ORDER — ZOLPIDEM TARTRATE 5 MG PO TABS: 5.0000 mg | ORAL_TABLET | Freq: Every evening | ORAL | Status: DC | PRN

## 2023-04-30 MED ORDER — PHENYLEPHRINE HCL-NACL 20-0.9 MG/250ML-% IV SOLN
INTRAVENOUS | Status: AC
  Filled 2023-04-30: qty 500

## 2023-04-30 MED ORDER — POTASSIUM CHLORIDE CRYS ER 20 MEQ PO TBCR
20.0000 meq | EXTENDED_RELEASE_TABLET | Freq: Every day | ORAL | Status: DC
  Administered 2023-05-01 – 2023-05-03 (×3): 20 meq via ORAL
  Filled 2023-04-30 (×3): qty 1

## 2023-04-30 MED ORDER — DEXAMETHASONE SODIUM PHOSPHATE 10 MG/ML IJ SOLN
INTRAMUSCULAR | Status: DC | PRN
  Administered 2023-04-30 (×2): 5 mg via INTRAVENOUS

## 2023-04-30 MED ORDER — ONDANSETRON HCL 4 MG/2ML IJ SOLN
4.0000 mg | Freq: Four times a day (QID) | INTRAMUSCULAR | Status: DC | PRN
  Administered 2023-04-30 – 2023-05-02 (×3): 4 mg via INTRAVENOUS
  Filled 2023-04-30 (×3): qty 2

## 2023-04-30 MED ORDER — STERILE WATER FOR IRRIGATION IR SOLN
Status: DC | PRN
  Administered 2023-04-30: 1000 mL

## 2023-04-30 MED ORDER — MENTHOL 3 MG MT LOZG: 1.0000 | LOZENGE | OROMUCOSAL | Status: DC | PRN

## 2023-04-30 MED ORDER — WITCH HAZEL-GLYCERIN EX PADS: 1.0000 | MEDICATED_PAD | CUTANEOUS | Status: DC | PRN

## 2023-04-30 MED ORDER — SIMETHICONE 80 MG PO CHEW
80.0000 mg | CHEWABLE_TABLET | Freq: Three times a day (TID) | ORAL | Status: DC
  Administered 2023-04-30 – 2023-05-03 (×9): 80 mg via ORAL
  Filled 2023-04-30 (×9): qty 1

## 2023-04-30 MED ORDER — TRANEXAMIC ACID-NACL 1000-0.7 MG/100ML-% IV SOLN
INTRAVENOUS | Status: DC | PRN
Start: 2023-04-30 — End: 2023-04-30
  Administered 2023-04-30: 1000 mg via INTRAVENOUS

## 2023-04-30 MED ORDER — MORPHINE SULFATE (PF) 0.5 MG/ML IJ SOLN
INTRAMUSCULAR | Status: DC | PRN
  Administered 2023-04-30: 150 ug via INTRATHECAL

## 2023-04-30 MED ORDER — IBUPROFEN 600 MG PO TABS
600.0000 mg | ORAL_TABLET | Freq: Four times a day (QID) | ORAL | Status: DC
  Administered 2023-05-01 – 2023-05-03 (×9): 600 mg via ORAL
  Filled 2023-04-30 (×9): qty 1

## 2023-04-30 MED ORDER — COCONUT OIL OIL: 1.0000 | TOPICAL_OIL | Status: DC | PRN

## 2023-04-30 MED ORDER — DEXTROSE 5 % IV SOLN
INTRAVENOUS | Status: DC | PRN
  Administered 2023-04-30: 3 g via INTRAVENOUS

## 2023-04-30 MED ORDER — CEFAZOLIN IN SODIUM CHLORIDE 3-0.9 GM/100ML-% IV SOLN
INTRAVENOUS | Status: AC
  Filled 2023-04-30: qty 100

## 2023-04-30 MED ORDER — ACETAMINOPHEN 10 MG/ML IV SOLN
INTRAVENOUS | Status: DC | PRN
  Administered 2023-04-30: 1000 mg via INTRAVENOUS

## 2023-04-30 MED ORDER — DIPHENHYDRAMINE HCL 50 MG/ML IJ SOLN
25.0000 mg | Freq: Four times a day (QID) | INTRAMUSCULAR | Status: DC | PRN
  Administered 2023-04-30 – 2023-05-01 (×3): 25 mg via INTRAVENOUS
  Filled 2023-04-30 (×3): qty 1

## 2023-04-30 MED ORDER — DIBUCAINE (PERIANAL) 1 % EX OINT: 1.0000 | TOPICAL_OINTMENT | CUTANEOUS | Status: DC | PRN

## 2023-04-30 SURGICAL SUPPLY — 38 items
BENZOIN TINCTURE PRP APPL 2/3 (GAUZE/BANDAGES/DRESSINGS) IMPLANT
CHLORAPREP W/TINT 26 (MISCELLANEOUS) ×4 IMPLANT
CLAMP UMBILICAL CORD (MISCELLANEOUS) ×2 IMPLANT
CLOTH BEACON ORANGE TIMEOUT ST (SAFETY) ×2 IMPLANT
DISSECTOR SURG LIGASURE 21 (MISCELLANEOUS) IMPLANT
DRSG OPSITE POSTOP 4X10 (GAUZE/BANDAGES/DRESSINGS) ×2 IMPLANT
ELECT REM PT RETURN 9FT ADLT (ELECTROSURGICAL) ×2 IMPLANT
ELECTRODE REM PT RTRN 9FT ADLT (ELECTROSURGICAL) ×2 IMPLANT
EXTRACTOR VACUUM M CUP 4 TUBE (SUCTIONS) IMPLANT
GAUZE PAD ABD 7.5X8 STRL (GAUZE/BANDAGES/DRESSINGS) IMPLANT
GAUZE SPONGE 4X4 12PLY STRL (GAUZE/BANDAGES/DRESSINGS) IMPLANT
GLOVE BIOGEL PI IND STRL 7.0 (GLOVE) ×4 IMPLANT
GLOVE BIOGEL PI IND STRL 8 (GLOVE) ×2 IMPLANT
GLOVE ECLIPSE 7.5 STRL STRAW (GLOVE) ×2 IMPLANT
GOWN STRL REUS W/TWL LRG LVL3 (GOWN DISPOSABLE) ×4 IMPLANT
GOWN STRL REUS W/TWL XL LVL3 (GOWN DISPOSABLE) ×2 IMPLANT
HEMOSTAT ARISTA ABSORB 3G PWDR (HEMOSTASIS) IMPLANT
KIT ABG SYR 3ML LUER SLIP (SYRINGE) IMPLANT
MAT PREVALON FULL STRYKER (MISCELLANEOUS) IMPLANT
NDL HYPO 25X5/8 SAFETYGLIDE (NEEDLE) IMPLANT
NEEDLE HYPO 25X5/8 SAFETYGLIDE (NEEDLE) IMPLANT
NS IRRIG 1000ML POUR BTL (IV SOLUTION) ×2 IMPLANT
PACK C SECTION WH (CUSTOM PROCEDURE TRAY) ×2 IMPLANT
PAD OB MATERNITY 4.3X12.25 (PERSONAL CARE ITEMS) ×2 IMPLANT
RETRACTOR TRAXI PANNICULUS (MISCELLANEOUS) IMPLANT
RTRCTR C-SECT PINK 25CM LRG (MISCELLANEOUS) ×2 IMPLANT
SPONGE LAP 18X18 X RAY DECT (DISPOSABLE) IMPLANT
STRIP CLOSURE SKIN 1/2X4 (GAUZE/BANDAGES/DRESSINGS) IMPLANT
SUT MNCRL 0 VIOLET CTX 36 (SUTURE) ×4 IMPLANT
SUT PLAIN ABS 2-0 CT1 27XMFL (SUTURE) IMPLANT
SUT VIC AB 0 CT1 27XBRD ANBCTR (SUTURE) IMPLANT
SUT VIC AB 0 CTX36XBRD ANBCTRL (SUTURE) ×2 IMPLANT
SUT VIC AB 2-0 CT1 TAPERPNT 27 (SUTURE) ×2 IMPLANT
SUT VIC AB 4-0 KS 27 (SUTURE) ×2 IMPLANT
TAPE CLOTH SURG 4X10 WHT LF (GAUZE/BANDAGES/DRESSINGS) IMPLANT
TOWEL OR 17X24 6PK STRL BLUE (TOWEL DISPOSABLE) ×2 IMPLANT
TRAY FOLEY W/BAG SLVR 14FR LF (SET/KITS/TRAYS/PACK) ×2 IMPLANT
WATER STERILE IRR 1000ML POUR (IV SOLUTION) ×2 IMPLANT

## 2023-04-30 NOTE — Progress Notes (Signed)
 Patient ID: Margaret Ingram, female   DOB: [birth year], 28 y.o.   MRN: [id number] FACULTY PRACTICE ANTEPARTUM COMPREHENSIVE PROGRESS NOTE  Margaret Ingram is a 28 y.o. [id number] at [redacted]w[redacted]d with hx prior CS x2, cerclage in place and FGR (1.5%, nml UAD) who is admitted for vaginal bleeding, BPP 6/10, and worsening cHTN .  Estimated Date of Delivery: 06/11/23 Fetal presentation is cephalic.  Length of Stay:  5 Days. Admitted 04/25/2023  Subjective: Patient reports that she is doing well this morning and has no major concerns.  She is ready for her C-section.  Denies any headache or visual changes.  Reports she does feel poor from the magnesium.  She is not leaking any fluid and reports good fetal movement.   Vitals:  Blood pressure (!) 149/94, pulse 89, temperature 97.8 F (36.6 C), temperature source Oral, resp. rate 18, height 5\' 2"  (1.575 m), weight 126.1 kg, last menstrual period 09/04/2022, SpO2 100%. Physical Examination: CONSTITUTIONAL: No acute distress, well appearing CARDIOVASCULAR: Normal heart rate noted, RESPIRATORY: Effort normal, no problems with respiration noted ABDOMEN: Soft nontender, gravid  NST 135 bpm, moderate variability, 15x15, no decels Toco flat  Results for orders placed or performed during the hospital encounter of 04/25/23 (from the past 48 hours)  CBC     Status: Abnormal   Collection Time: 04/29/23  4:18 AM  Result Value Ref Range   WBC 8.3 4.0 - 10.5 K/uL   RBC 3.82 (L) 3.87 - 5.11 MIL/uL   Hemoglobin 11.3 (L) 12.0 - 15.0 g/dL   HCT 13.0 (L) 86.5 - 78.4 %   MCV 87.7 80.0 - 100.0 fL   MCH 29.6 26.0 - 34.0 pg   MCHC 33.7 30.0 - 36.0 g/dL   RDW 69.6 29.5 - 28.4 %   Platelets 143 (L) 150 - 400 K/uL   nRBC 0.8 (H) 0.0 - 0.2 %    Comment: Performed at [facility], [address] [place], [place] [address]  Comprehensive metabolic panel     Status: Abnormal   Collection Time: 04/29/23  4:18 AM  Result Value Ref Range   Sodium 136 135 - 145 mmol/L    Potassium 3.6 3.5 - 5.1 mmol/L   Chloride 106 98 - 111 mmol/L   CO2 22 22 - 32 mmol/L   Glucose, Bld 98 70 - 99 mg/dL    Comment: Glucose reference range applies only to samples taken after fasting for at least 8 hours.   BUN 5 (L) 6 - 20 mg/dL   Creatinine, Ser 0.10 0.44 - 1.00 mg/dL   Calcium 8.5 (L) 8.9 - 10.3 mg/dL   Total Protein 6.1 (L) 6.5 - 8.1 g/dL   Albumin 2.6 (L) 3.5 - 5.0 g/dL   AST 16 15 - 41 U/L   ALT 11 0 - 44 U/L   Alkaline Phosphatase 65 38 - 126 U/L   Total Bilirubin 0.5 0.0 - 1.2 mg/dL   GFR, Estimated >27 >25 mL/min    Comment: (NOTE) Calculated using the CKD-EPI Creatinine Equation (2021)    Anion gap 8 5 - 15    Comment: Performed at [facility], [address], [place], [place] [address]  CBC     Status: Abnormal   Collection Time: 04/30/23  3:21 AM  Result Value Ref Range   WBC 7.9 4.0 - 10.5 K/uL   RBC 3.96 3.87 - 5.11 MIL/uL   Hemoglobin 11.5 (L) 12.0 - 15.0 g/dL   HCT 03.4 (L) 74.2 -  46.0 %   MCV 88.6 80.0 - 100.0 fL   MCH 29.0 26.0 - 34.0 pg   MCHC 32.8 30.0 - 36.0 g/dL   RDW 95.6 21.3 - 08.6 %   Platelets 174 150 - 400 K/uL   nRBC 0.0 0.0 - 0.2 %    Comment: Performed at [facility], [address] [place], [place] [address]  Comprehensive metabolic panel     Status: Abnormal   Collection Time: 04/30/23  3:21 AM  Result Value Ref Range   Sodium 135 135 - 145 mmol/L   Potassium 3.8 3.5 - 5.1 mmol/L   Chloride 106 98 - 111 mmol/L   CO2 22 22 - 32 mmol/L   Glucose, Bld 121 (H) 70 - 99 mg/dL    Comment: Glucose reference range applies only to samples taken after fasting for at least 8 hours.   BUN 6 6 - 20 mg/dL   Creatinine, Ser 9.62 0.44 - 1.00 mg/dL   Calcium 7.4 (L) 8.9 - 10.3 mg/dL   Total Protein 6.5 6.5 - 8.1 g/dL   Albumin 2.9 (L) 3.5 - 5.0 g/dL   AST 19 15 - 41 U/L   ALT 13 0 - 44 U/L   Alkaline Phosphatase 71 38 - 126 U/L   Total Bilirubin 0.4 0.0 - 1.2 mg/dL   GFR, Estimated >95 >28 mL/min    Comment:  (NOTE) Calculated using the CKD-EPI Creatinine Equation (2021)    Anion gap 7 5 - 15    Comment: Performed at [facility], [address], [place], [place] [address]  Type and screen [facility]     Status: None   Collection Time: 04/30/23  3:21 AM  Result Value Ref Range   ABO/RH(D) A POS    Antibody Screen NEG    Sample Expiration      05/03/2023,2359 Performed at [facility], [address] [place], [place] [address]   Magnesium     Status: Abnormal   Collection Time: 04/30/23  3:21 AM  Result Value Ref Range   Magnesium 4.2 (H) 1.7 - 2.4 mg/dL    Comment: Performed at [facility], [address], [place], [place] [address]    [place] MFM FETAL BPP WO NON STRESS Result Date: 04/28/2023 ----------------------------------------------------------------------  OBSTETRICS REPORT                       (Signed Final 04/28/2023 05:16 pm) ---------------------------------------------------------------------- Patient Info  ID #:       [id number]                          D.O.B.:  [birth year] (27 yrs)(F)  Name:       Margaret Ingram               Visit Date: 04/28/2023 03:00 pm ---------------------------------------------------------------------- Performed By  Attending:        Noralee Space MD        Secondary Phy.:    Lennart Pall MD  Performed By:  Louie Casa       Address:           [address]                    RDMS  Referred By:      [facility]          Location:          [facility] and                    for [employer]  Ref. Address:     [address]                    [place], [place]                    [address] ---------------------------------------------------------------------- Orders  #  Description                           Code        Ordered By  1  [place] MFM FETAL BPP WO NON               76819.01    [facility]     STRESS  ----------------------------------------------------------------------  #  Order #                     Accession #                Episode #  1  [id number]                   [id number]                 [id number] ---------------------------------------------------------------------- Indications  Non-reactive NST                                O28.9  Hypertension - Chronic/Pre-existing             O10.019  Maternal care for known or suspected poor       O36.5931  fetal growth, third trimester, fetus 1 IUGR  Obesity complicating pregnancy, third           O99.213  trimester  History of pre-term deliveries                  O09.219  Cervical cerclage suture present, third         O34.33  trimester  Vaginal bleeding in pregnancy, third trimester  O46.93  [redacted] weeks gestation of pregnancy                 Z3A.33 ---------------------------------------------------------------------- Vital Signs  Weight (lb): 278                               Height:        5'2"  BMI:         50.84 ---------------------------------------------------------------------- Fetal Evaluation  Num Of Fetuses:          1  Fetal Heart Rate(bpm):   134  Cardiac Activity:        Observed  Presentation:            Cephalic  Placenta:                Anterior Fundal  P. Cord Insertion:       Previously seen  Amniotic Fluid  AFI FV:      Within normal limits  AFI Sum(cm)     %Tile       Largest Pocket(cm)  14.3            50          5.1  RUQ(cm)       RLQ(cm)       LUQ(cm)        LLQ(cm)  4.2           5.1           2              3  ---------------------------------------------------------------------- Biophysical Evaluation  Amniotic F.V:   Pocket => 2 cm             F. Tone:         Observed  F. Movement:    Observed                   Score:           8/8  F. Breathing:   Observed ---------------------------------------------------------------------- OB History  Blood Type:   A+  Gravidity:    6         Term:   2        Prem:   3  Living:       4  ---------------------------------------------------------------------- Gestational Age  Clinical EDD:  33w 5d                                        EDD:   06/11/23  Best:          33w 5d     Det. By:  Clinical EDD             EDD:   06/11/23 ---------------------------------------------------------------------- Anatomy  Ventricles:            Not well visualized    Stomach:                Appears normal, left                                                                        sided  Heart:                 Previously seen        Kidneys:                Not well visualized  Diaphragm:             Appears normal         Bladder:                Appears normal ---------------------------------------------------------------------- Impression  BPP was requested because of nonreactive NST.  Amniotic fluid is normal and good fetal activity seen.  Cephalic presentation.  Antenatal testing is reassuring.  BPP  8/8. ----------------------------------------------------------------------                 Noralee Space, MD Electronically Signed Final Report   04/28/2023 05:16 pm ----------------------------------------------------------------------     Current scheduled medications  NIFEdipine  90 mg Oral q AM   sodium chloride flush  3-10 mL Intravenous Q12H   valACYclovir  500 mg Oral BID    I have reviewed the patient's current medications.  ASSESSMENT: Principal Problem:   Vaginal bleeding in pregnancy, third trimester Active Problems:   History of severe pre-eclampsia   Chronic hypertension   Chronic hypertension in pregnancy   Cervical cerclage suture present in third trimester   Obesity in pregnancy   IUGR (intrauterine growth restriction) affecting care of mother, third trimester   History of C-section x 2   PLAN: 28 yo [id number] at 33weeks 6 days  Chronic hypertension with superimposed preeclampsia - Asymptomatic at this time - Patient had severe range blood pressures yesterday morning she was  consented for repeat cesarean section. - Currently on Procardia 90 mg daily - Continuing on magnesium sulfate for seizure prophylaxis - Planning on C-section today  The risks of cesarean section were discussed with the patient including but were not limited to: bleeding which may require transfusion or reoperation; infection which may require antibiotics; injury to bowel, bladder, ureters or other surrounding organs; injury to the fetus; need for additional procedures including hysterectomy in the event of a life-threatening hemorrhage; placental abnormalities wth subsequent pregnancies, incisional problems, thromboembolic phenomenon and other postoperative/anesthesia complications.  Patient also desires permanent sterilization.  Other reversible forms of contraception were discussed with patient; she declines all other modalities. Risks of procedure discussed with patient including but not limited to: risk of regret, permanence of method, bleeding, infection, injury to surrounding organs and need for additional procedures.  Failure risk of about 1% with increased risk of ectopic gestation if pregnancy occurs was also discussed with patient.  Also discussed possibility of post-tubal pain syndrome. The patient concurred with the proposed plan, giving informed written consent for the procedures.  Patient has been NPO since last night she will remain NPO for procedure. Anesthesia and OR aware.  Preoperative prophylactic antibiotics and SCDs ordered on call to the OR.    FGR/FWB - @33 /2 1589g (1.5%), AC <1%, cephalic, anterior, 11.79, 4/8 (movement & tone), normal UAD > repeat BPP 8/8 - s/p magnesium, BMZ & rescue BMZ 3/6-7 - s/p NICU consult in prior admission (1/9). Patient requesting NICU consult in light of eminent delivery in the setting of IUGR  Prior CS x2, request for permanent sterilization Plan for repeat c-section with BTL Tubal papers signed 2/13  Continue routine antenatal care.  Derrel Nip, MD Attending Family Medicine Physician, [facility] for [facility], [facility]

## 2023-04-30 NOTE — Discharge Summary (Signed)
 Postpartum Discharge Summary  Date of Service updated***     Patient Name: Margaret Ingram DOB: 1996-01-19 MRN: [id number]  Date of admission: 04/25/2023 Delivery date:04/30/2023 Delivering provider: Celedonio Savage Date of discharge: 04/30/2023  Admitting diagnosis: Vaginal bleeding in pregnancy, third trimester [O46.93] Intrauterine pregnancy: [redacted]w[redacted]d     Secondary diagnosis:  Principal Problem:   Status post repeat low transverse cesarean section Active Problems:   History of severe pre-eclampsia   Chronic hypertension   Chronic hypertension in pregnancy   Vaginal bleeding in pregnancy, third trimester   Cervical cerclage suture present in third trimester   Obesity in pregnancy   IUGR (intrauterine growth restriction) affecting care of mother, third trimester   History of C-section x 2   Status post bilateral salpingectomy  Additional problems: ***    Discharge diagnosis: Preterm Pregnancy Delivered and CHTN with superimposed preeclampsia                                              Post partum procedures:*** Augmentation: N/A Complications: None  Hospital course: 28 y.o. yo [id number] at [redacted]w[redacted]d was admitted to antepartum 04/25/2023 for vaginal bleeding, severe IUGR, cHTN w/SIPE. Received BMZ course, magnesium. Delivery planned for 34 weeks. Patient with 2 prior C/S and desire for sterilization. Underwent repeat cesarean, bilateral salpingectomy, cerclage removal. Extensive scar tissue present. Delivery details are as follows:  Membrane Rupture Time/Date: 1:01 PM,04/30/2023  Delivery Method:C-Section, Low Transverse Operative Delivery:N/A Details of operation can be found in separate operative note.  Patient had a postpartum course complicated by***.  She is ambulating, tolerating a regular diet, passing flatus, and urinating well. Patient is discharged home in stable condition on  04/30/23        Newborn Data: Birth date:04/30/2023 Birth time:1:01 PM Gender:Female Living  status:Living Apgars:1 ,7  Weight:1610 g    Magnesium Sulfate received: Yes: Seizure prophylaxis BMZ received: Yes Rhophylac:No MMR:No T-DaP:Given prenatally Flu: No RSV Vaccine received: No Transfusion:{Transfusion received:30440034}  Immunizations received: Immunization History  Administered Date(s) Administered   Tdap 09/10/2012, 03/20/2023    Physical exam  Vitals:   04/30/23 0500 04/30/23 0810 04/30/23 0900 04/30/23 1417  BP:  (!) 149/94  (!) 106/53  Pulse:  89  61  Resp:  18 18   Temp:  97.8 F (36.6 C)    TempSrc:  Oral    SpO2: 97% 100%  93%  Weight:      Height:       General: {Exam; general:21111117} Lochia: {Desc; appropriate/inappropriate:30686::"appropriate"} Uterine Fundus: {Desc; firm/soft:30687} Incision: {Exam; incision:21111123} DVT Evaluation: {Exam; dvt:2111122} Labs: Lab Results  Component Value Date   WBC 7.9 04/30/2023   HGB 11.5 (L) 04/30/2023   HCT 35.1 (L) 04/30/2023   MCV 88.6 04/30/2023   PLT 174 04/30/2023      Latest Ref Rng & Units 04/30/2023    3:21 AM  CMP  Glucose 70 - 99 mg/dL 147   BUN 6 - 20 mg/dL 6   Creatinine 8.29 - 5.62 mg/dL 1.30   Sodium 865 - 784 mmol/L 135   Potassium 3.5 - 5.1 mmol/L 3.8   Chloride 98 - 111 mmol/L 106   CO2 22 - 32 mmol/L 22   Calcium 8.9 - 10.3 mg/dL 7.4   Total Protein 6.5 - 8.1 g/dL 6.5   Total Bilirubin 0.0 - 1.2 mg/dL 0.4   Alkaline Phos  38 - 126 U/L 71   AST 15 - 41 U/L 19   ALT 0 - 44 U/L 13    Edinburgh Score:    27/05/2019    9:24 AM  Edinburgh Postnatal Depression Scale Screening Tool  I have been able to laugh and see the funny side of things. 0  I have looked forward with enjoyment to things. 1  I have blamed myself unnecessarily when things went wrong. 3  I have been anxious or worried for no good reason. 2  I have felt scared or panicky for no good reason. 2  Things have been getting on top of me. 2  I have been so unhappy that I have had difficulty sleeping. 2  I have  felt sad or miserable. 3  I have been so unhappy that I have been crying. 3  The thought of harming myself has occurred to me. 0  Edinburgh Postnatal Depression Scale Total 18   No data recorded  After visit meds:  Allergies as of 04/30/2023       Reactions   Grapefruit Concentrate Swelling   Azithromycin Nausea And Vomiting, Nausea Only   Other Rash   *Brown Band-Aid*   Wound Dressing Adhesive Rash   *Brown Band-Aid*     Med Rec must be completed prior to using this [facility]***        Discharge home in stable condition Infant Feeding: {Baby feeding:23562} Infant Disposition:{CHL IP OB HOME WITH ZHYQMV:78469} Discharge instruction: per After Visit Summary and Postpartum booklet. Activity: Advance as tolerated. Pelvic rest for 6 weeks.  Diet: {OB GEXB:28413244} Future Appointments: Future Appointments  Date Time Provider Department Center  05/02/2023 10:30 AM [facility] US2 [facility]  05/09/2023  8:30 AM [facility] US5 [facility]  05/09/2023 10:15 AM Warden Fillers, MD [facility]  05/16/2023  8:30 AM [facility] US5 [facility]  05/16/2023 11:15 AM Reva Bores, MD [facility]   Follow up Visit:  Message sent to [facility] 3/11  Please schedule this patient for a In person postpartum visit in 6 weeks with the following provider: Any provider. Additional Postpartum F/U: Incision check 1 week and BP check 1 week  High risk pregnancy complicated by:  cHTN w/SIPE, IUGR, cervical incompetence Delivery mode:  C-Section, Low Transverse Anticipated Birth Control:   bilateral salpingectomy   04/30/2023 Joanne Gavel, MD

## 2023-04-30 NOTE — Transfer of Care (Signed)
 Immediate Anesthesia Transfer of Care Note  Patient: Margaret Ingram  Procedure(s) Performed: CESAREAN DELIVERY REMOVAL, CERCLAGE SUTURE, WITH ANESTHESIA (Cervix)  Patient Location: PACU  Anesthesia Type:Spinal and Epidural  Level of Consciousness: awake  Airway & Oxygen Therapy: Patient Spontanous Breathing  Post-op Assessment: Report given to RN  Post vital signs: Reviewed and stable  Last Vitals:  Vitals Value Taken Time  BP 106/53 04/30/23 1417  Temp    Pulse 61 04/30/23 1419  Resp    SpO2 96 % 04/30/23 1419  Vitals shown include unfiled device data.  Last Pain:  Vitals:   04/30/23 0810  TempSrc: Oral  PainSc:          Complications: No notable events documented.

## 2023-04-30 NOTE — Progress Notes (Signed)
 At 2:10am, 04/30/23 Jannette Spanner RN entered room and patient was attempting to walk to bathroom, very unsteady and about to fall down. Charge nurse notified and came in room to assist and pt was helped to the bathroom with a Stedy and magnesium sulfate discontinued. Brandley Aldrete called Dr. Vergie Living and got order to d/c magnesium sulfate and order a STAT magneisum level. Vitals taken, WDL, continuous fetal monitoring resumed.

## 2023-04-30 NOTE — Lactation Note (Signed)
 This note was copied from a baby's chart.  NICU Lactation Consultation Note  Patient Name: Margaret Ingram EXBMW'U Date: 04/30/2023 Age:27 hours  Reason for consult: Initial assessment; Infant < 5lbs; Late-preterm 34-36.6wks; Other (Comment); NICU baby (severe IUGR, SGA, cHTN, Pre-E)  SUBJECTIVE Visited with family of 6 60/14 weeks old NICU female; baby Margaret "Margaret Ingram" (parents haven't picked up a name yet) was admitted due to prematurity. Margaret Ingram is a P6 and experienced breastfeeding; her plan to do both, direct breastfeeding along with pumping and bottle feeding with breastmilk/formula. Set up a DEBP but she voiced she's not ready to start pumping yet. Provided a pumping band in size XL for hands on pumping and set up a drying/washing station. Reviewed pumping schedule, pumping log, lactogenesis II, CDC and anticipatory guidelines.  OBJECTIVE Infant data: Mother's Current Feeding Choice: Breast Milk and Formula  O2 Device: CPAP FiO2 (%): 21 %  Maternal data: [id number] C-Section, Low Transverse Has patient been taught Hand Expression?: Yes Hand Expression Comments: no colostrum noted yet Significant Breast History:: (+) breast changes during the pregnancy Current breast feeding challenges:: NICU admission Previous breastfeeding challenges?: Infant separation (baby # 4 was in NICU in 2021) Does the patient have breastfeeding experience prior to this delivery?: Yes How long did the patient breastfeed?: 5-6 months Pumping frequency: Set up DEBP at 4 hours post-partum Flange Size: 21 Hands-free pumping top sizes: X-Large Chilton Si) Risk factor for low/delayed milk supply:: C/S, Pre-E, < 4 lbs, severe IUGR, SGA, BMI=45, infant separation  WIC Program: Yes WIC Referral Sent?: Yes What county?: [place]  ASSESSMENT Infant: Feeding Status: NPO  Maternal: Breasts are soft, tissue is compressible  INTERVENTIONS/PLAN Interventions: Interventions: Breast feeding basics reviewed;  Coconut oil; DEBP; Education; [employer] brochure; CDC Guidelines for Breast Pump Cleaning; NICU Pumping Log Tools: Pump; Flanges; Coconut oil; Hands-free pumping top Pump Education: Setup, frequency, and cleaning; Milk Storage  Plan: STS whenever possible Massage and hand express both breasts prior/after pumping Pump both breasts on initiate mode every 3 hours for 15 minutes; ideally 8 pumping sessions/24 hours  FOB present. All questions and concerns answered, family to contact [facility] services PRN.  Consult Status: NICU follow-up NICU Follow-up type: New admission follow up   Margaret Ingram S Philis Nettle 04/30/2023, 5:31 PM

## 2023-04-30 NOTE — Anesthesia Postprocedure Evaluation (Signed)
 Anesthesia Post Note  Patient: Scientist, research (medical)  Procedure(s) Performed: CESAREAN DELIVERY REMOVAL, CERCLAGE SUTURE, WITH ANESTHESIA (Cervix)     Patient location during evaluation: PACU Anesthesia Type: Spinal and Epidural Level of consciousness: awake Pain management: pain level controlled Vital Signs Assessment: post-procedure vital signs reviewed and stable Respiratory status: spontaneous breathing, nonlabored ventilation and respiratory function stable Cardiovascular status: stable Postop Assessment: no headache, no backache and epidural receding Anesthetic complications: no   No notable events documented.  Last Vitals:  Vitals:   04/30/23 1500 04/30/23 1515  BP: (!) 112/54   Pulse: 60 64  Resp: 15 13  Temp:  (!) 36.4 C  SpO2: 93% 98%    Last Pain:  Vitals:   04/30/23 1515  TempSrc: Oral  PainSc:    Pain Goal:    LLE Motor Response: Purposeful movement (04/30/23 1500) LLE Sensation: Tingling (04/30/23 1500) RLE Motor Response: Purposeful movement (04/30/23 1500) RLE Sensation: Tingling (04/30/23 1500)     Epidural/Spinal Function Cutaneous sensation: Tingles (04/30/23 1500), Patient able to flex knees: Yes (04/30/23 1500), Patient able to lift hips off bed: Yes (04/30/23 1500), Back pain beyond tenderness at insertion site: No (04/30/23 1500), Progressively worsening motor and/or sensory loss: No (04/30/23 1500), Bowel and/or bladder incontinence post epidural: No (04/30/23 1500)  Linton Rump

## 2023-04-30 NOTE — Anesthesia Procedure Notes (Addendum)
 Epidural Patient location during procedure: OB Start time: 04/30/2023 11:30 AM End time: 04/30/2023 11:52 AM  Staffing Anesthesiologist: Linton Rump, MD Performed: anesthesiologist   Preanesthetic Checklist Completed: patient identified, IV checked, site marked, risks and benefits discussed, surgical consent, monitors and equipment checked, pre-op evaluation and timeout performed  Epidural Patient position: sitting Prep: DuraPrep and site prepped and draped Patient monitoring: continuous pulse ox and blood pressure Approach: midline Location: L3-L4 Injection technique: LOR air  Needle:  Needle type: Tuohy  Needle gauge: 17 G Needle length: 9 cm and 9 Needle insertion depth: 8 cm Catheter type: closed end flexible Catheter size: 19 Gauge Catheter at skin depth: 14 cm Test dose: negative  Assessment Sensory level: T4 Events: blood not aspirated, injection not painful, no injection resistance, no paresthesia and negative IV test  Additional Notes A CSE was placed for surgical anesthesia. Risks and benefits including, but not limited to, infection, bleeding, local anesthetic toxicity, headache, hypotension, back pain, block failure, etc. were discussed with the patient. The patient expressed understanding and consented to the procedure. I confirmed that the patient has no bleeding disorders and is not taking blood thinners. I confirmed the patient's last platelet count with the nurse. A time-out was performed immediately prior to the procedure. Please see nursing documentation for vital signs. Sterile technique was used throughout the whole procedure. Once LOR achieved, a spinal needle was passed through the Tuohy needle with return of clear CSF. CSF aspirated freely. The spinal dose was administered, and the spinal needle was removed. The epidural catheter then threaded easily without resistance. Aspiration of the catheter was negative for blood and CSF.   4 attempt(s)  with multiple redirectionsReason for block:procedure for pain

## 2023-04-30 NOTE — Op Note (Signed)
 Cesarean Section Operative Note   Patient: Margaret Ingram  Date of Procedure: 04/30/2023  Procedure: Repeat Low Transverse Cesarean and Bilateral Tubal Ligation via Bilateral salpingectomy   Indications: previous uterine incision: low transverse and undesired fertility, hx C/S x2, cerclage in place  Pre-operative Diagnosis: repeat cesarean section with bilateral tubal ligation, cerclage removal, cHTN w/SIPE  Post-operative Diagnosis: Same and Bilateral Tubal Sterilization via Bilateral salpingectomy  TOLAC Candidate: No  Surgeon: Surgeons and Role:    * Celedonio Savage, MD - Primary    * Adam Phenix, MD - Assisting    * Joanne Gavel, MD - Fellow  Assistants: An experienced assistant was required given the standard of surgical care given the complexity of the case.  This assistant was needed for exposure, dissection, suctioning, retraction, instrument exchange, assisting with delivery with administration of fundal pressure, and for overall help during the procedure.   Anesthesia: spinal  Anesthesiologist: Linton Rump, MD   Antibiotics: Cefazolin   Estimated Blood Loss: 489 ml   Total IV Fluids: 1227 ml  Urine Output:  350 cc OF clear urine  Specimens: bilateral fallopian tubes to pathology  Complications:  severe adhesive disease    Indications: Margaret Ingram is a 28 y.o. [id number] with an IUP [redacted]w[redacted]d presenting for scheduled cesarean secondary to the indications listed above. Clinical course notable for initial admission to antepartum 3/6 for vaginal bleeding, severe IUGR, cHTN w/SIPE. Received BMZ course.  The risks of cesarean section were discussed with the patient including but were not limited to: bleeding which may require transfusion or reoperation; infection which may require antibiotics; injury to bowel, bladder, ureters or other surrounding organs; injury to the fetus; need for additional procedures including hysterectomy in the event of  a life-threatening hemorrhage; placental abnormalities wth subsequent pregnancies, incisional problems, thromboembolic phenomenon and other postoperative/anesthesia complications.  Patient also desires permanent sterilization.  Other reversible forms of contraception were discussed with patient; she declines all other modalities. Risks of procedure discussed with patient including but not limited to: risk of regret, permanence of method, bleeding, infection, injury to surrounding organs and need for additional procedures.  Failure risk of about 1% with increased risk of ectopic gestation if pregnancy occurs was also discussed with patient.  Also discussed possibility of post-tubal pain syndrome. The patient concurred with the proposed plan, giving informed written consent for the procedures.  Patient has been NPO since last night she will remain NPO for procedure. Anesthesia and OR aware.  Preoperative prophylactic antibiotics and SCDs ordered on call to the OR.   Findings: Viable infant in cephalic presentation, no nuchal cord present. Apgars 1, 7, 9. Weight 1610 g. Clear amniotic fluid. Normal placenta, three vessel cord. Uterine window present in lower uterine segment with increased vascularity, Normal bilateral fallopian tubes, Normal bilateral ovaries. Severe adhesive disease, especially between rectus, peritoneum, uterus .  Procedure Details: A Time Out was held and the above information confirmed. The patient received intravenous antibiotics and had sequential compression devices applied to her lower extremities preoperatively. The patient was taken back to the operative suite where spinal anesthesia was administered. After induction of anesthesia, the patient was draped and prepped in the usual sterile manner and placed in a dorsal supine position with a leftward tilt. A low transverse skin incision was made with scalpel and carried down through the subcutaneous tissue to the fascia. Fascial incision was  made and extended sharply transversely. The fascia was separated from the underlying rectus tissue superiorly and inferiorly  via sharp dissection. The rectus muscles were separated in the midline sharply and the peritoneum was entered sharply. Extensive, severe adhesive disease encountered. An Alexis retractor was placed to aid in visualization of the uterus. A bladder flap was not developed. A low transverse uterine incision was made. The infant was successfully delivered from cephalic presentation, the umbilical cord was clamped after 1 minute. Cord ph was not sent, and cord blood was obtained for evaluation. The placenta was removed Intact and appeared normal. The uterine incision was closed with a single layer running unlocked suture of 0-Monocryl. Overall, excellent hemostasis was noted. Bleeding noted from vessel in lower uterine segment/near bladder. Additional stitch and arista placed for hemostasis.  Attention was then turned to the fallopian tubes. The left Fallopian tube was identified and then traced to it's fimbriae. Using the Ligasure device and taking care to avoid large vascular structures, the left fallopian tube was removed sequentially from the fimbriae to the cornua, with excellent hemostasis noted. Attention was then turned to the right fallopian tube, and after confirmation of identification by tracing the tube out to the fimbriae, the same procedure was then performed on with excellent hemostasis noted.  The abdomen and pelvis were cleared of all clot and debris and the Jon Gills was removed. Hemostasis was confirmed on all surfaces.  The peritoneum/rectus/scar tissue was reapproximated using 2-0 vicryl  to contain bowel. The fascia was then closed using 0 Vicryl in a running fashion. Defect in scarpa's noted; this was closed with additional sutures. The subcutaneous layer was reapproximated with 2-0 plain gut suture. The skin was closed with a 4-0 vicryl subcuticular stitch. The patient  tolerated the procedure well. Sponge, lap, instrument and needle counts were correct x 2. She was taken to the recovery room in stable condition.  Disposition: PACU - hemodynamically stable.    Signed: Joanne Gavel, MD OB Fellow, [facility] for [facility], [facility]

## 2023-05-01 LAB — CBC
HCT: 31.9 % — ABNORMAL LOW (ref 36.0–46.0)
Hemoglobin: 10.7 g/dL — ABNORMAL LOW (ref 12.0–15.0)
MCH: 29.5 pg (ref 26.0–34.0)
MCHC: 33.5 g/dL (ref 30.0–36.0)
MCV: 87.9 fL (ref 80.0–100.0)
Platelets: 155 10*3/uL (ref 150–400)
RBC: 3.63 MIL/uL — ABNORMAL LOW (ref 3.87–5.11)
RDW: 12.6 % (ref 11.5–15.5)
WBC: 14 10*3/uL — ABNORMAL HIGH (ref 4.0–10.5)
nRBC: 0 % (ref 0.0–0.2)

## 2023-05-01 MED ORDER — HYDROMORPHONE HCL 1 MG/ML IJ SOLN: 0.2000 mg | INTRAMUSCULAR | Status: DC | PRN

## 2023-05-01 MED ORDER — GABAPENTIN 100 MG PO CAPS
100.0000 mg | ORAL_CAPSULE | Freq: Three times a day (TID) | ORAL | Status: DC
  Administered 2023-05-01 – 2023-05-03 (×6): 100 mg via ORAL
  Filled 2023-05-01 (×6): qty 1

## 2023-05-01 MED ORDER — FERROUS SULFATE 325 (65 FE) MG PO TABS
325.0000 mg | ORAL_TABLET | ORAL | Status: DC
  Administered 2023-05-01 – 2023-05-03 (×2): 325 mg via ORAL
  Filled 2023-05-01 (×2): qty 1

## 2023-05-01 NOTE — Lactation Note (Addendum)
 This note was copied from a baby's chart.  NICU Lactation Consultation Note  Patient Name: Margaret Ingram [id number] Date: 05/01/2023 Age:27 hours  Reason for consult: Follow-up assessment; NICU baby; Late-preterm 34-36.6wks; Infant < 5lbs; Maternal endocrine disorder Type of Endocrine Disorder?: Diabetes (Gestational DM, Pre-eclampsia on MgSO4) IUGR infant < 4lbs  SUBJECTIVE  LC in to visit with P6 Mom of LPTI "Aiden" in the NICU.  Mom currently on the phone with [facility] regarding picking up her pump when she is discharged.    LC noticed that pump hasn't been used yet.  LC set up a washing and a drying bin and labeled them.  Mom encouraged to start pumping to support her milk supply.    LC will F/U to speak to Mom at a later time today.   LC in to assist Mom with first pumping at 21 hrs.  Mom resized to 18 mm flanges.  Mom provided with encouragement and education on frequency of pumping and cleaning of pump parts.    OBJECTIVE Infant data: Mother's Current Feeding Choice: Breast Milk and Donor Milk  O2 Device: CPAP FiO2 (%): 21 %  Infant feeding assessment IDFTS - Readiness: 3   Maternal data: [id number] C-Section, Low Transverse Has patient been taught Hand Expression?: Yes Hand Expression Comments: no colostrum noted yet Significant Breast History:: (+) breast changes during the pregnancy Current breast feeding challenges:: NICU admission Previous breastfeeding challenges?: Infant separation (baby # 4 was in NICU in 2021) Does the patient have breastfeeding experience prior to this delivery?: Yes How long did the patient breastfeed?: 5-6 months Pumping frequency: Encouraged to pump every 3 hrs to establish and support milk supply Flange Size: 21 Hands-free pumping top sizes: X-Large (Green) Risk factor for low/delayed milk supply:: C/S, Pre-E, < 4 lbs, severe IUGR, SGA, BMI=45, infant separation  WIC Program: Yes WIC Referral Sent?: Yes (mom on the phone with [facility] office  regarding obtaining a pump on her discharge from hospital) What county?: [place] (Lives in [place], but is active with WIC in [place])  ASSESSMENT Infant:   Feeding Status: Scheduled 8-11-2-5 Feeding method: Tube/Gavage (Bolus)  Maternal: No data recorded INTERVENTIONS/PLAN Interventions: Interventions: Breast feeding basics reviewed; Coconut oil; DEBP; Education; [employer] brochure; CDC Guidelines for Breast Pump Cleaning; NICU Pumping Log Tools: Pump; Flanges; Hands-free pumping top Pump Education: Setup, frequency, and cleaning; Milk Storage  Plan: Consult Status: NICU follow-up NICU Follow-up type: Verify onset of copious milk; Verify absence of engorgement; Maternal D/C visit; Verify DEBP issuance   Judee Clara 05/01/2023, 10:57 AM

## 2023-05-01 NOTE — Progress Notes (Signed)
 Postpartum Day 1: Cesarean Delivery and Bilateral Tubal Sterilization at [redacted]w[redacted]d in the setting of CHTN with superimposed severe preeclampsia and IUGE  Subjective: Patient reports tolerating PO and no problems voiding.  Ambulating. Minimal pain.  Bottlefeeding infant who is doing well in NICU. Moderate lochia reported.  Patient denies any headaches, visual symptoms, RUQ/epigastric pain or other concerning symptoms.   Objective: Vital signs in last 24 hours: Temp:  [97.5 F (36.4 C)-98.1 F (36.7 C)] 97.6 F (36.4 C) (03/12 0421) Pulse Rate:  [58-89] 66 (03/12 0421) Resp:  [11-18] 18 (03/12 0421) BP: (105-149)/(49-94) 123/61 (03/12 0421) SpO2:  [89 %-100 %] 100 % (03/12 0421)   Patient Vitals for the past 24 hrs:  BP Temp Temp src Pulse Resp SpO2  05/01/23 0421 123/61 97.6 F (36.4 C) Oral 66 18 100 %  05/01/23 0411 -- -- -- -- 16 --  05/01/23 0405 -- -- -- -- 16 --  05/01/23 0049 -- -- -- -- 16 --  04/30/23 2337 (!) 113/53 97.6 F (36.4 C) Axillary 75 17 98 %  04/30/23 2200 -- -- -- -- 16 --  04/30/23 2129 -- -- -- -- 16 --  04/30/23 2042 -- -- -- -- 18 --  04/30/23 1954 105/60 98.1 F (36.7 C) Oral 83 17 96 %  04/30/23 1926 -- -- -- -- 18 --  04/30/23 1840 -- -- -- -- 18 --  04/30/23 1730 -- -- -- -- 18 --  04/30/23 1643 113/61 (!) 97.5 F (36.4 C) Oral 72 17 95 %  04/30/23 1531 (!) 111/49 97.8 F (36.6 C) Oral 61 16 94 %  04/30/23 1515 (!) 118/57 (!) 97.5 F (36.4 C) Oral 64 13 98 %  04/30/23 1500 (!) 112/54 -- -- 60 15 93 %  04/30/23 1445 (!) 113/50 -- -- (!) 58 15 94 %  04/30/23 1430 (!) 107/49 -- -- (!) 58 11 (!) 89 %  04/30/23 1417 (!) 106/53 97.6 F (36.4 C) Oral 61 13 93 %  04/30/23 0900 -- -- -- -- 18 --  04/30/23 0810 (!) 149/94 97.8 F (36.6 C) Oral 89 18 100 %    Physical Exam:  General: alert and no distress Lochia: appropriate Uterine Fundus: firm Incision: Dressing in place with old drainage DVT Evaluation: No evidence of DVT seen on physical exam.   Negative Homan's sign.  No cords or calf tenderness. No significant calf/ankle edema.  Recent Labs    04/30/23 0321 05/01/23 0420  HGB 11.5* 10.7*  HCT 35.1* 31.9*    Assessment/Plan: Status post Cesarean section and Bilateral Tubal Sterilization. Doing well postoperatively.  BP is stable, continue Procardia XL 90 mg daily, Lasix 20 mg daily for now Analgesia as needed, encourage OOB Patient was started on oral iron therapy for clinically significant but asymptomatic acute postoperative anemia due to expected blood loss. Continue routine postpartum care.    Jaynie Collins, MD 05/01/2023, 5:03 AM

## 2023-05-01 NOTE — Progress Notes (Signed)
 Called to see patient for 9 out of 10 abdominal pain despite scheduled tylenol/ibuprofen and oxycodone 10mg .   On arrival to patients room, she is standing at bedside in no acute distress. Abdomen soft, mildly diffusely tender throughout, c/w POD1. No rebound guarding. Pressure dressing in place.  She describes pain as migratory, comes and goes. Can be sharp. Got better after passing gas. Also has burning incisional pain.   Today's Vitals   05/01/23 1640 05/01/23 1945 05/01/23 2026 05/01/23 2148  BP:  126/63  (!) 145/76  Pulse:  71  70  Resp:  18  18  Temp:  97.9 F (36.6 C)  98 F (36.7 C)  TempSrc:  Oral  Oral  SpO2:  96%  98%  Weight:      Height:      PainSc: 3   7     Suspect gas pain and nerve pain around incision. No e/o acute intraabdominal process.  Will give simethicone now and add gabapentin 100mg  TID. Dilaudid ordered for severe breakthrough pain  Harvie Bridge, MD Obstetrician & Gynecologist, [facility] [address] for [employer], [facility]

## 2023-05-02 ENCOUNTER — Ambulatory Visit: Payer: MEDICAID

## 2023-05-02 LAB — SURGICAL PATHOLOGY

## 2023-05-02 NOTE — Progress Notes (Signed)
 POSTPARTUM PROGRESS NOTE  POD #2  Subjective:  Margaret Ingram is a 28 y.o. [id number] s/p repeat LTCS and BTL at [redacted]w[redacted]d.  No acute events overnight. She reports she is doing well. She denies any problems with ambulating, voiding or po intake. Denies nausea or vomiting. She has passed flatus. Pain is well controlled.  Lochia is normal.  Objective: Blood pressure 136/86, pulse 69, temperature 98.1 F (36.7 C), temperature source Oral, resp. rate 16, height 5\' 2"  (1.575 m), weight 126.1 kg, last menstrual period 09/04/2022, SpO2 99%, unknown if currently breastfeeding.  Physical Exam:  General: alert, cooperative and no distress Chest: no respiratory distress, CTA bilaterally Heart: regular rate and rhythm Abdomen: soft, nontender, nondistended, positive bowel sounds Uterine Fundus: firm, appropriately tender DVT Evaluation: No calf swelling or tenderness Extremities: trace edema Skin: warm, dry; incision clean/dry/intact w/ honeycomb dressing in place  Recent Labs    04/30/23 0321 05/01/23 0420  HGB 11.5* 10.7*  HCT 35.1* 31.9*    Assessment/Plan: Margaret Ingram is a 28 y.o. [id number] s/p repeat c section with BTL at [redacted]w[redacted]d for severe preeclampsia.  POD#2 -  Contraception: BTL Feeding: bottle Continue lasix, potassium previously normal S/p magnesium sulfate, BP controlled with procardia 90 mg Mom consented for circ, see separate note   Dispo: anticipate discharge on 05/03/23.   LOS: 7 days   Margaret Aloe, Md Faculty Attending, Center for [employer] 05/02/2023, 10:14 AM

## 2023-05-02 NOTE — Clinical Social Work Maternal (Signed)
 CLINICAL SOCIAL WORK MATERNAL/CHILD NOTE  Patient Details  Name: Margaret Ingram MRN: [id number] Date of Birth: [birth year]  Date:  05/02/2023  Clinical Social Worker Initiating Note:  Celso Sickle, [place] Date/Time: Initiated:  05/02/23/1140     Child's Name:  Margaret Ingram   Biological Parents:  Mother, Father (Father: Dominique Ingram 11/27/90)   Need for Interpreter:  None   Reason for Referral:  Behavioral Health Concerns, Parental Support of Premature Babies < 32 weeks/or Critically Ill babies   Address:  [address] [place] [address]    Phone number:  [phone] (home)     Additional phone number:   Household Members/Support Persons (HM/SP):   Household Member/Support Person 1, Household Member/Support Person 2, Household Member/Support Person 3, Household Member/Support Person 4, Household Member/Support Person 5, Household Member/Support Person 6, Household Member/Support Person 7   HM/SP Name Relationship DOB or Age  HM/SP -1 [name] Dad    HM/SP -2 [name] Stepmom    HM/SP -3   little brother    HM/SP -4 Chaselynn Fabela son 02/03/16  HM/SP -5 Marlene Bast Ingram son 08/12/17  HM/SP -6 Abra Lingenfelter daughter 08/28/18  HM/SP -7 Nova Ingram daughter 04/05/21  HM/SP -8          Natural Supports (not living in the home):  Immediate Family   Professional Supports:     Employment: Unemployed   Type of Work:     Education:  Engineer, agricultural   Homebound arranged:    Surveyor, quantity Resources:  [employer]   Other Resources:  [employer], Sales executive     Cultural/Religious Considerations Which May Impact Care:    Strengths:  Ability to meet basic needs  , Merchandiser, retail, Understanding of illness   Psychotropic Medications:         Pediatrician:    Fish farm manager area  Pediatrician List:   [employer] [facility] [place]    [place]    [place] [facility]      Pediatrician Fax Number:    Risk  Factors/Current Problems:  Mental Health Concerns     Cognitive State:  Able to Concentrate  , Alert  , Insightful  , Linear Thinking     Mood/Affect:  Calm  , Euthymic  , Interested     CSW Assessment: CSW met with MOB at bedside to complete psychosocial assessment. MOB was on the phone with her step mom. CSW introduced self and explained role. MOB granted CSW verbal permission to speak while her step mom was on speaker phone. MOB recalled meeting CSW in 2021.  MOB's step mom reported that she needed to call her back and got off the phone.   MOB was welcoming, pleasant, open, and remained engaged during assessment.   MOB reported that she resides with her dad, step mom, little brother, and her four older children. MOB shared that she is currently unemployed as she was unable to work during her high risk pregnancy. MOB verbalized a plan to return to work first shift and noted that FOB will work third shift so they can alternate caring for the children. MOB shared that FOB just got home from jail last month and noted that he is getting readjusted to the children. MOB inquired about any parenting class resources for the both of them, CSW agreed to provide resources. MOB reported that she has started to shop for infant and has a car seat and three in  one pack n play. MOB reported that she will need assistance with obtaining other baby items for infant. CSW informed MOB about [facility]'s Clinical cytogeneticist, MOB reported that she was interested in a referral. CSW agreed to complete a referral for baby basics. CSW inquired about MOB's support system, MOB reported that her dad, step mom, sisters, brother, and grandma are supports.  CSW provided review of Sudden Infant Death Syndrome (SIDS) precautions.  MOB shared that she loss her son to SIDS. CSW offered condolences and inquired about MOB's participation in grief counseling. MOB reported that she attempted grief counseling and shared that she  wants to do it now as she has a fear that she may have excessive worry and be overbearing with the new baby. MOB reported that she still hasn't gotten over the loss of her son. CSW acknowledged, normalized, and validated MOB's feelings. CSW positively affirmed MOB's interest in participating in grief counseling at this time and agreed to provide resources. CSW informed MOB that the NICU has a Technical brewer available for additional support, MOB reported that she would be interested in speaking with the Student Psychologist. CSW agreed to complete a referral.   CSW inquired about MOB's mental health history. MOB reported that she was diagnosed with anxiety and depression in middle school. MOB endorsed having postpartum depression after each pregnancy. MOB reported that she used medication and counseling to treat PPD. MOB reported that both were helpful. MOB reported that she is not currently taking any medication nor participating in therapy to treat mental health diagnoses. MOB reiterated her interest in counseling, CSW agreed to place resources at infant's bedside. MOB shared that she typically experiences depression during pregnancy and shared that she is experiencing a little depression. MOB described her depression as sadness, crying, and a loss of motivation at times. MOB shared that she doesn't like to be alone and it triggers symptoms of anxiety. MBO described her anxiety as being uncomfortable in large crowds, meeting new people, difficulty concentrating due to racing thoughts, excessive worry, irrational fears, and shakiness. CSW and MOB discussed MOB's coping skills, MOB reported that she likes to be around her supports or on the phone with supports. MOB shared that her parents and or FOB can calm her down when needed. CSW positively affirmed MOB's support system. CSW encouraged MOB to follow up with therapy resources and discussed the benefits.   CSW inquired about how MOB was feeling  emotionally since giving birth, MOB reported that she was very emotional. CSW acknowledged, normalized, and validated MOB's feelings. CSW emphasized the importance of MOB paying close attention for signs/symptoms of PPD as she has experienced PPD after each pregnancy. CSW discussed the importance of MOB caring for herself during this difficult time.   MOB presented calm and did not demonstrate any acute mental health signs/symptoms. MOB possessed some insight about her mental health. CSW assessed for safety, MOB denied SI, HI, and domestic violence.   CSW provided education regarding the baby blues period vs. perinatal mood disorders, discussed treatment and gave resources for mental health follow up if concerns arise.  CSW recommends self-evaluation during the postpartum time period using the New Mom Checklist from Postpartum Progress and encouraged MOB to contact a medical professional if symptoms are noted at any time.    CSW and MOB discussed infant's NICU admission. CSW informed MOB about the NICU, what to expect, and supports available while infant is admitted to the NICU. MOB reported that infant's NICU admission has  been going really good and shared that she feels well informed about infant's care. MOB denied any transportation barriers with visiting infant in the NICU and shared that she recently purchased a car. MOB denied any questions/concerns regarding the NICU.   CSW completed FSN referral for requested items.   CSW made referral to NICU Student Psychologist for additional support.   CSW placed local mental health resources and other requested resources at infant's bedside.  CSW will continue to offer resources/supports while infant is admitted to the NICU as MOB opted for CSW to check in weekly.   CSW Plan/Description:  Sudden Infant Death Syndrome (SIDS) Education, Perinatal Mood and Anxiety Disorder (PMADs) Education, Psychosocial Support and Ongoing Assessment of Needs, Other  Information/Referral to [employer], Other Patient/Family Education    Antionette Poles, LCSW 05/02/2023, 11:55 AM

## 2023-05-02 NOTE — Progress Notes (Signed)
 Attending Circumcision Counseling Progress Note  Patient desires circumcision for her female infant.  Circumcision procedure details discussed, risks and benefits of procedure were also discussed.  These include but are not limited to: Benefits of circumcision in men include reduction in the rates of urinary tract infection (UTI), penile cancer, some sexually transmitted infections, penile inflammatory and retractile disorders, as well as easier hygiene.  Risks include bleeding , infection, injury of glans which may lead to penile deformity or urinary tract issues, unsatisfactory cosmetic appearance and other potential complications related to the procedure.  It was emphasized that this is an elective procedure.  Patient wants to proceed with circumcision; written informed consent obtained.  Will do circumcision whem baby is stable per NICU team, routine circumcision and post circumcision care ordered for the infant.  Warden Fillers, MD 05/02/2023 10:28 AM

## 2023-05-03 ENCOUNTER — Other Ambulatory Visit (HOSPITAL_COMMUNITY): Payer: Self-pay

## 2023-05-03 MED ORDER — FUROSEMIDE 20 MG PO TABS
20.0000 mg | ORAL_TABLET | Freq: Every day | ORAL | 0 refills | Status: DC
  Filled 2023-05-03: qty 5, 5d supply, fill #0

## 2023-05-03 MED ORDER — SENNOSIDES-DOCUSATE SODIUM 8.6-50 MG PO TABS
2.0000 | ORAL_TABLET | Freq: Every evening | ORAL | 0 refills | Status: AC | PRN
  Filled 2023-05-03: qty 30, 15d supply, fill #0

## 2023-05-03 MED ORDER — NIFEDIPINE ER 90 MG PO TB24
90.0000 mg | ORAL_TABLET | Freq: Every day | ORAL | 0 refills | Status: DC
  Filled 2023-05-03: qty 30, 30d supply, fill #0

## 2023-05-03 MED ORDER — ACETAMINOPHEN 500 MG PO TABS
1000.0000 mg | ORAL_TABLET | Freq: Four times a day (QID) | ORAL | 0 refills | Status: DC
  Filled 2023-05-03: qty 60, 8d supply, fill #0

## 2023-05-03 MED ORDER — OXYCODONE HCL 5 MG PO TABS
5.0000 mg | ORAL_TABLET | ORAL | 0 refills | Status: AC | PRN
  Filled 2023-05-03: qty 15, 3d supply, fill #0

## 2023-05-03 MED ORDER — CHEWING GUM (ORBIT) SUGAR FREE
1.0000 | CHEWING_GUM | Freq: Three times a day (TID) | ORAL | Status: DC
  Administered 2023-05-03: 1 via ORAL
  Filled 2023-05-03: qty 1

## 2023-05-03 MED ORDER — POTASSIUM CHLORIDE CRYS ER 20 MEQ PO TBCR
20.0000 meq | EXTENDED_RELEASE_TABLET | Freq: Every day | ORAL | 0 refills | Status: AC
  Filled 2023-05-03: qty 5, 5d supply, fill #0

## 2023-05-03 MED ORDER — CALCIUM CARBONATE ANTACID 500 MG PO CHEW
1.0000 | CHEWABLE_TABLET | Freq: Three times a day (TID) | ORAL | Status: DC | PRN
  Administered 2023-05-03: 200 mg via ORAL
  Filled 2023-05-03: qty 1

## 2023-05-03 MED ORDER — IBUPROFEN 600 MG PO TABS
600.0000 mg | ORAL_TABLET | Freq: Four times a day (QID) | ORAL | 0 refills | Status: AC
  Filled 2023-05-03: qty 30, 8d supply, fill #0

## 2023-05-03 NOTE — Progress Notes (Signed)
 POD#3 s/p rLTCS & BTS Subjective: Notes persistent burning migratory abdominal pain and nausea. Was passing flatus but hasn't been overnight/this AM. Didn't eat dinner last night because of her stomach pains.  Ambulating without issue. Voiding spontaneously.   Objective: Blood pressure 122/81, pulse 92, temperature 98 F (36.7 C), temperature source Oral, resp. rate 16, height 5\' 2"  (1.575 m), weight 126.1 kg, last menstrual period 09/04/2022, SpO2 98%, unknown if currently breastfeeding.  Physical Exam:  General: alert, cooperative, and no distress Abdomen: soft, mildly distended, mildly diffusely tender, no rebound or guarding Lochia: appropriate Uterine Fundus: firm Incision: honeycomb in place, old shadowing but otherwise c/d/i DVT Evaluation: No evidence of DVT seen on physical exam.  Recent Labs    05/01/23 0420  HGB 10.7*  HCT 31.9*    Assessment/Plan: Postpartum Possible ileus given obstipation & nausea this AM. Discussed bowel rest, ambulation and will reassess clinical status in the afternoon.  - Contraception: s/p BTS - MOF: Formula - Rh status: Rh+ - Rubella status: Immune - Dispo: Anticipate discharge home POD3-4  Neonatal - In NICU - Circumcision: desired, consented by Dr. Donavan Foil. Will performed when baby is closer to discharge.   3. SIPE w/ SF (BP) - asymptomatic; s/p Mag - well controlled on procardia XL 90mg  daily - lasix x 5d PP w/ PO K  4. Positive depression screen - s/p SW eval, no barriers to discharge   LOS: 8 days   Lennart Pall 05/03/2023, 10:43 AM

## 2023-05-03 NOTE — Lactation Note (Signed)
 This note was copied from a baby's chart.  NICU Lactation Consultation Note  Patient Name: Boy Myrla Malanowski [id number] Date: 05/03/2023 Age:28 years  Reason for consult: Follow-up assessment; NICU baby; Late-preterm 34-36.6wks; Infant < 5lbs; Maternal endocrine disorder Type of Endocrine Disorder?: Diabetes  SUBJECTIVE  LC in to visit with P6 Mom of LPTI in the NICU.  Baby "Aiden" is currently on CPAP and receiving gavage feedings.    Mom is expected to be discharged tomorrow.  Mom reports to have pumped 3 times yesterday.  LC provided Mom with another pumping band and assisted to pump for the first time today.  Encouraged Mom to pump more often today.  Mom hasn't been feeling well.  Encouraged her to pump 6-8 times this 24 hrs.  Mom understands about the [facility] loaner pump for a refundable deposit of $30.   Mom also has not held baby STS yet.  Mom aware of care times at 11-2-5 and encouraged going a half hour early to let RN know she would like to hold baby.  OBJECTIVE Infant data: No data recorded O2 Device: CPAP FiO2 (%): 21 %  Infant feeding assessment IDFTS - Readiness: 3   Maternal data: [id number] C-Section, Low Transverse Pumping frequency: Mom pumped 3 times yesterday, encouraged pumping every 3 hrs Pumped volume: 0 mL (drops) Flange Size: 18 Hands-free pumping top sizes: [place] Guys [place] Si)  WIC Program: Yes WIC Referral Sent?: Yes (mom on the phone with [facility] office regarding obtaining a pump on her discharge from hospital) What county?: [place] (Lives in [place], but is active with WIC in [place]) Pump:  (Offered a [facility] loaner on discharge)  ASSESSMENT Infant:  Feeding Status: Scheduled 8-11-2-5 Feeding method: Tube/Gavage (Bolus)  Maternal: Milk volume: Normal  INTERVENTIONS/PLAN Interventions: Interventions: Breast feeding basics reviewed; Skin to skin; Breast massage; Hand express; DEBP; Education Tools: Pump; Flanges; Hands-free pumping top Pump  Education: Setup, frequency, and cleaning; Milk Storage  Plan: Consult Status: NICU follow-up NICU Follow-up type: Verify absence of engorgement; Verify onset of copious milk; Verify DEBP issuance   Judee Clara 05/03/2023, 10:52 AM

## 2023-05-03 NOTE — Progress Notes (Signed)
Pt discharged home in stable condition after discharge instructions given. Pt verbalized understanding and all questions were answered. IV was discontinued and pt was sent home with all belongings.

## 2023-05-03 NOTE — Progress Notes (Signed)
 CSW received and acknowledges consult for EDPS of 16.  Consult screened out as MOB was seen on yesterday by CSW and CSW provided education regarding the baby blues period vs. perinatal mood disorders, discussed treatment and gave resources for mental health follow up if concerns arise.  CSW recommends self-evaluation during the postpartum time period using the New Mom Checklist from Postpartum Progress and encouraged MOB to contact a medical professional if symptoms are noted at any time.    CSW identifies no further need for intervention and no barriers to discharge at this time.  CSW contacted MOB to inquire about her interest in a IBH visit, MOB reported yes and that she prefers morning appointments. Patient requests a referral to Integrated Behavioral Health.  Patient verbalizes understanding that the appointment will be virtual.  Celso Sickle, LCSW Clinical Social Worker [facility] Cell#: [phone]

## 2023-05-04 ENCOUNTER — Inpatient Hospital Stay (HOSPITAL_COMMUNITY): Payer: MEDICAID

## 2023-05-04 ENCOUNTER — Encounter (HOSPITAL_COMMUNITY): Payer: Self-pay | Admitting: Obstetrics and Gynecology

## 2023-05-04 ENCOUNTER — Other Ambulatory Visit: Payer: Self-pay

## 2023-05-04 ENCOUNTER — Inpatient Hospital Stay (HOSPITAL_COMMUNITY)
Admission: AD | Admit: 2023-05-04 | Discharge: 2023-05-07 | DRG: 776 | Disposition: A | Discharge status: Home / Self Care | Payer: MEDICAID | Attending: Obstetrics and Gynecology | Admitting: Obstetrics and Gynecology

## 2023-05-04 DIAGNOSIS — O8600 Infection of obstetric surgical wound, unspecified: Secondary | ICD-10-CM | POA: Diagnosis present

## 2023-05-04 DIAGNOSIS — L0390 Cellulitis, unspecified: Secondary | ICD-10-CM | POA: Insufficient documentation

## 2023-05-04 DIAGNOSIS — O8612 Endometritis following delivery: Secondary | ICD-10-CM

## 2023-05-04 DIAGNOSIS — Z79899 Other long term (current) drug therapy: Secondary | ICD-10-CM

## 2023-05-04 DIAGNOSIS — Z833 Family history of diabetes mellitus: Secondary | ICD-10-CM

## 2023-05-04 DIAGNOSIS — Z8249 Family history of ischemic heart disease and other diseases of the circulatory system: Secondary | ICD-10-CM

## 2023-05-04 HISTORY — DX: Endometritis following delivery: O86.12

## 2023-05-04 LAB — CBC WITH DIFFERENTIAL/PLATELET
Abs Immature Granulocytes: 0.03 10*3/uL (ref 0.00–0.07)
Basophils Absolute: 0 10*3/uL (ref 0.0–0.1)
Basophils Relative: 0 %
Eosinophils Absolute: 0.1 10*3/uL (ref 0.0–0.5)
Eosinophils Relative: 1 %
HCT: 37 % (ref 36.0–46.0)
Hemoglobin: 12.4 g/dL (ref 12.0–15.0)
Immature Granulocytes: 0 %
Lymphocytes Relative: 19 %
Lymphs Abs: 1.5 10*3/uL (ref 0.7–4.0)
MCH: 29.7 pg (ref 26.0–34.0)
MCHC: 33.5 g/dL (ref 30.0–36.0)
MCV: 88.5 fL (ref 80.0–100.0)
Monocytes Absolute: 0.6 10*3/uL (ref 0.1–1.0)
Monocytes Relative: 7 %
Neutro Abs: 5.7 10*3/uL (ref 1.7–7.7)
Neutrophils Relative %: 73 %
Platelets: 221 10*3/uL (ref 150–400)
RBC: 4.18 MIL/uL (ref 3.87–5.11)
RDW: 12.6 % (ref 11.5–15.5)
WBC: 7.9 10*3/uL (ref 4.0–10.5)
nRBC: 0 % (ref 0.0–0.2)

## 2023-05-04 LAB — COMPREHENSIVE METABOLIC PANEL
ALT: 17 U/L (ref 0–44)
AST: 25 U/L (ref 15–41)
Albumin: 2.9 g/dL — ABNORMAL LOW (ref 3.5–5.0)
Alkaline Phosphatase: 59 U/L (ref 38–126)
Anion gap: 13 (ref 5–15)
BUN: 13 mg/dL (ref 6–20)
CO2: 21 mmol/L — ABNORMAL LOW (ref 22–32)
Calcium: 8.8 mg/dL — ABNORMAL LOW (ref 8.9–10.3)
Chloride: 98 mmol/L (ref 98–111)
Creatinine, Ser: 0.62 mg/dL (ref 0.44–1.00)
GFR, Estimated: >60 mL/min (ref >60)
Glucose, Bld: 96 mg/dL (ref 70–99)
Potassium: 4.6 mmol/L (ref 3.5–5.1)
Sodium: 132 mmol/L — ABNORMAL LOW (ref 135–145)
Total Bilirubin: 0.7 mg/dL (ref 0.0–1.2)
Total Protein: 6.6 g/dL (ref 6.5–8.1)

## 2023-05-04 LAB — URINALYSIS, ROUTINE W REFLEX MICROSCOPIC
Bilirubin Urine: NEGATIVE
Glucose, UA: NEGATIVE mg/dL
Ketones, ur: NEGATIVE mg/dL
Leukocytes,Ua: NEGATIVE
Nitrite: NEGATIVE
Protein, ur: NEGATIVE mg/dL
Specific Gravity, Urine: 1.021 (ref 1.005–1.030)
pH: 5 (ref 5.0–8.0)

## 2023-05-04 LAB — LACTIC ACID, PLASMA: Lactic Acid, Venous: 0.8 mmol/L (ref 0.5–1.9)

## 2023-05-04 MED ORDER — SIMETHICONE 80 MG PO CHEW
80.0000 mg | CHEWABLE_TABLET | Freq: Four times a day (QID) | ORAL | Status: DC
  Administered 2023-05-04 – 2023-05-07 (×12): 80 mg via ORAL
  Filled 2023-05-04 (×12): qty 1

## 2023-05-04 MED ORDER — NIFEDIPINE ER OSMOTIC RELEASE 60 MG PO TB24
90.0000 mg | ORAL_TABLET | Freq: Every day | ORAL | Status: DC
  Administered 2023-05-04 – 2023-05-07 (×4): 90 mg via ORAL
  Filled 2023-05-04 (×4): qty 1

## 2023-05-04 MED ORDER — OXYCODONE HCL 5 MG PO TABS
5.0000 mg | ORAL_TABLET | Freq: Four times a day (QID) | ORAL | Status: DC | PRN
  Administered 2023-05-05 (×4): 10 mg via ORAL
  Administered 2023-05-06 (×2): 5 mg via ORAL
  Filled 2023-05-04 (×5): qty 2
  Filled 2023-05-04: qty 1

## 2023-05-04 MED ORDER — SODIUM CHLORIDE 0.9% FLUSH
3.0000 mL | Freq: Two times a day (BID) | INTRAVENOUS | Status: DC
  Administered 2023-05-05: 3 mL via INTRAVENOUS

## 2023-05-04 MED ORDER — LABETALOL HCL 5 MG/ML IV SOLN: 40.0000 mg | INTRAVENOUS | Status: DC | PRN

## 2023-05-04 MED ORDER — GENTAMICIN SULFATE 40 MG/ML IJ SOLN
5.0000 mg/kg | INTRAVENOUS | Status: AC
  Administered 2023-05-04 – 2023-05-06 (×3): 400 mg via INTRAVENOUS
  Filled 2023-05-04 (×3): qty 10

## 2023-05-04 MED ORDER — LABETALOL HCL 5 MG/ML IV SOLN: 20.0000 mg | INTRAVENOUS | Status: DC | PRN

## 2023-05-04 MED ORDER — LABETALOL HCL 5 MG/ML IV SOLN: 80.0000 mg | INTRAVENOUS | Status: DC | PRN

## 2023-05-04 MED ORDER — HYDROMORPHONE HCL 1 MG/ML IJ SOLN
1.0000 mg | Freq: Once | INTRAMUSCULAR | Status: AC
  Administered 2023-05-04: 1 mg via INTRAVENOUS
  Filled 2023-05-04: qty 1

## 2023-05-04 MED ORDER — CLINDAMYCIN PHOSPHATE 900 MG/50ML IV SOLN
900.0000 mg | Freq: Three times a day (TID) | INTRAVENOUS | Status: AC
  Administered 2023-05-04 – 2023-05-07 (×8): 900 mg via INTRAVENOUS
  Filled 2023-05-04 (×8): qty 50

## 2023-05-04 MED ORDER — NIFEDIPINE ER OSMOTIC RELEASE 60 MG PO TB24: 90.0000 mg | ORAL_TABLET | Freq: Every day | ORAL | Status: DC

## 2023-05-04 MED ORDER — METOCLOPRAMIDE HCL 5 MG/ML IJ SOLN
10.0000 mg | Freq: Once | INTRAMUSCULAR | Status: AC
Start: 2023-05-04 — End: 2023-05-04
  Administered 2023-05-04: 10 mg via INTRAVENOUS
  Filled 2023-05-04: qty 2

## 2023-05-04 MED ORDER — LACTATED RINGERS IV BOLUS
1000.0000 mL | Freq: Once | INTRAVENOUS | Status: AC
Start: 2023-05-04 — End: 2023-05-04
  Administered 2023-05-04: 1000 mL via INTRAVENOUS

## 2023-05-04 MED ORDER — SODIUM CHLORIDE 0.9 % IV SOLN
250.0000 mL | INTRAVENOUS | Status: AC | PRN
  Administered 2023-05-04: 250 mL via INTRAVENOUS

## 2023-05-04 MED ORDER — POLYETHYLENE GLYCOL 3350 17 G PO PACK
17.0000 g | PACK | Freq: Two times a day (BID) | ORAL | Status: DC | PRN
  Administered 2023-05-04: 17 g via ORAL
  Filled 2023-05-04 (×2): qty 1

## 2023-05-04 MED ORDER — HYDRALAZINE HCL 20 MG/ML IJ SOLN: 10.0000 mg | INTRAMUSCULAR | Status: DC | PRN

## 2023-05-04 MED ORDER — SODIUM CHLORIDE 0.9% FLUSH: 3.0000 mL | INTRAVENOUS | Status: DC | PRN

## 2023-05-04 NOTE — MAU Note (Signed)
.  Kyelle Urbas is a 28 y.o. at Unknown here in MAU reporting: Sharp and burning pains in her entire abdomen since 3/12. She reports she had a C/S on 3/11. She reports the pain stopped yesterday so she was discharged. She reports when she arrived home the pain in her abdomen returned. She reports she began having hot flashes, N/V, and fatigue. She reports she has been unable to sleep due to the pain. She reports she has not passed gas since prior to discharge yesterday. Last BM prior to delivery. Reports her bleeding has improved.  Has not taken anything for the pain as she has been vomiting.  Onset of complaint: 3/12 Pain score: 9/10 abdomen  Vitals:   05/04/23 1104  BP: 127/85  Pulse: (!) 124  Resp: 18  Temp: 99 F (37.2 C)  SpO2: 100%     Lab orders placed from triage: none

## 2023-05-04 NOTE — Plan of Care (Signed)
  Problem: Education: Goal: Knowledge of disease or condition will improve Outcome: Progressing Goal: Knowledge of the prescribed therapeutic regimen will improve Outcome: Progressing   Problem: Fluid Volume: Goal: Peripheral tissue perfusion will improve Outcome: Progressing   Problem: Clinical Measurements: Goal: Complications related to disease process, condition or treatment will be avoided or minimized Outcome: Progressing   Problem: Education: Goal: Knowledge of General Education information will improve Description: Including pain rating scale, medication(s)/side effects and non-pharmacologic comfort measures Outcome: Progressing   Problem: Health Behavior/Discharge Planning: Goal: Ability to manage health-related needs will improve Outcome: Progressing   Problem: Clinical Measurements: Goal: Ability to maintain clinical measurements within normal limits will improve Outcome: Progressing Goal: Will remain free from infection Outcome: Progressing Goal: Diagnostic test results will improve Outcome: Progressing Goal: Respiratory complications will improve Outcome: Progressing Goal: Cardiovascular complication will be avoided Outcome: Progressing   Problem: Activity: Goal: Risk for activity intolerance will decrease Outcome: Progressing   Problem: Nutrition: Goal: Adequate nutrition will be maintained Outcome: Progressing   Problem: Coping: Goal: Level of anxiety will decrease Outcome: Progressing   Problem: Elimination: Goal: Will not experience complications related to bowel motility Outcome: Progressing Goal: Will not experience complications related to urinary retention Outcome: Progressing   Problem: Pain Managment: Goal: General experience of comfort will improve and/or be controlled Outcome: Progressing   Problem: Safety: Goal: Ability to remain free from injury will improve Outcome: Progressing   Problem: Skin Integrity: Goal: Risk for impaired  skin integrity will decrease Outcome: Progressing

## 2023-05-04 NOTE — H&P (Addendum)
 History     CSN: [id number]  Arrival date and time: 05/04/23 1049   Event Date/Time   First Provider Initiated Contact with Patient 05/04/23 1204      Chief Complaint  Patient presents with   Abdominal Pain   Margaret Ingram , a  28 y.o. [id number] at 4 days postpartum with complaints of severe abdominal pain and tenderness.  Patient reports lower abdominal pain and burning since yesterday prior to discharge.  Patient states attempted to relieve with p.o. Tylenol ibuprofen  and discharge meds but unrelieved. Today noted tenderness to touch redness around incision and dressing wet or damp.  Patient currently reports pain 10 out of 10. Denies having a bowel movement since prior to discharge.  Reports last flatulence yesterday prior to discharge.  Patient also complains of frequent nausea and vomiting since discharge home last night.  Reports unable to keep anything down.  Reports attempted to take meds for pain and nausea and vomiting but thrown up fairly soon afterwards.  Reports light bleeding denies saturating pads or passing clots.  Patient denies urinary symptoms or abnormal vaginal discharge.  Patient denies fever chills at home.    OB History     Gravida  6   Para  6   Term  2   Preterm  4   AB      Living  5      SAB      IAB      Ectopic      Multiple  0   Live Births  6           Past Medical History:  Diagnosis Date   Anxiety    Asthma    Chronic hypertension affecting pregnancy 11/26/2022   Depression    with pregnancies   Fibroid    Headache    Heart murmur    Hx of chlamydia infection 08/17/2018   Hypertension    chronic   Infection    UTI   Pre-diabetes 06/22/2019   Pregnancy induced hypertension    Seizures (HCC)    anxiety    Past Surgical History:  Procedure Laterality Date   CERVICAL CERCLAGE  01/28/2023   CERVICAL CERCLAGE  10/2020   rescue cerclage   CESAREAN SECTION N/A 06/22/2019   Procedure: CESAREAN SECTION;   Surgeon: Adam Phenix, MD;  Location: [facility] LD ORS;  Service: Obstetrics;  Laterality: N/A;  STAT Cord prolapse Liletta   CESAREAN SECTION  03/2021   and cerclage removal   CESAREAN SECTION     CESAREAN SECTION N/A 04/30/2023   Procedure: CESAREAN DELIVERY;  Surgeon: Celedonio Savage, MD;  Location: [facility] LD ORS;  Service: Obstetrics;  Laterality: N/A;   WISDOM TOOTH EXTRACTION      Family History  Problem Relation Age of Onset   Asthma Mother    Healthy Father    Cancer Maternal Grandmother    Gout Paternal Grandmother    Hypertension Paternal Grandmother    Diabetes Paternal Grandmother    Gout Paternal Uncle    Hypertension Paternal Uncle    Kidney failure Paternal Uncle    Diabetes Other     Social History   Tobacco Use   Smoking status: Never   Smokeless tobacco: Never   Tobacco comments:    occ  Vaping Use   Vaping status: Never Used  Substance Use Topics   Alcohol use: Not Currently    Comment: occasionally    Drug use: Not Currently  Types: Marijuana    Comment: "not since I got pregnant"    Allergies:  Allergies  Allergen Reactions   Grapefruit Concentrate Swelling   Azithromycin Nausea And Vomiting and Nausea Only   Other Rash    *Brown Band-Aid*   Wound Dressing Adhesive Rash    *Brown Band-Aid*    Medications Prior to Admission  Medication Sig Dispense Refill Last Dose/Taking   acetaminophen (TYLENOL) 500 MG tablet Take 2 tablets (1,000 mg total) by mouth every 6 (six) hours. 60 tablet 0 Past Week   ibuprofen (ADVIL) 600 MG tablet Take 1 tablet (600 mg total) by mouth every 6 (six) hours. 30 tablet 0 Past Week   potassium chloride SA (KLOR-CON M) 20 MEQ tablet Take 1 tablet (20 mEq total) by mouth daily. 5 tablet 0 Past Week   Prenatal Vit-Fe Fumarate-FA (MULTIVITAMIN-PRENATAL) 27-0.8 MG TABS tablet Take 1 tablet by mouth daily. 30 tablet 0 Past Week   cyclobenzaprine (FLEXERIL) 10 MG tablet Take 1 tablet (10 mg total) by mouth 3 (three) times daily  as needed for muscle spasms. 30 tablet 0 Unknown   furosemide (LASIX) 20 MG tablet Take 1 tablet (20 mg total) by mouth daily. 5 tablet 0    NIFEdipine (ADALAT CC) 90 MG 24 hr tablet Take 1 tablet (90 mg total) by mouth daily. 30 tablet 0    ondansetron (ZOFRAN-ODT) 4 MG disintegrating tablet Take 1 tablet (4 mg total) by mouth every 8 (eight) hours as needed for nausea. 5 tablet 0    oxyCODONE (OXY IR/ROXICODONE) 5 MG immediate release tablet Take 1 tablet (5 mg total) by mouth every 4 (four) hours as needed for breakthrough pain. 15 tablet 0    senna-docusate (SENOKOT-S) 8.6-50 MG tablet Take 2 tablets by mouth at bedtime as needed for mild constipation. 30 tablet 0     Review of Systems  Constitutional:  Positive for appetite change. Negative for chills, fatigue and fever.  Eyes:  Negative for pain and visual disturbance.  Respiratory:  Negative for apnea, shortness of breath and wheezing.   Cardiovascular:  Negative for chest pain and palpitations.  Gastrointestinal:  Positive for abdominal pain, nausea and vomiting. Negative for constipation and diarrhea.  Genitourinary:  Positive for vaginal bleeding and vaginal discharge. Negative for difficulty urinating, dysuria, pelvic pain and vaginal pain.  Musculoskeletal:  Negative for back pain.  Skin:  Positive for color change.  Neurological:  Negative for seizures, weakness and headaches.  Psychiatric/Behavioral:  Negative for suicidal ideas.    Physical Exam   Blood pressure 127/85, pulse (!) 124, temperature 99 F (37.2 C), temperature source Oral, resp. rate 18, SpO2 100%, currently breastfeeding.  Physical Exam Vitals and nursing note reviewed.  Constitutional:      General: She is in acute distress.     Appearance: Normal appearance. She is ill-appearing.  HENT:     Head: Normocephalic.  Pulmonary:     Effort: Pulmonary effort is normal.  Abdominal:     General: Bowel sounds are decreased.     Tenderness: There is abdominal  tenderness in the right lower quadrant and left lower quadrant. There is guarding. There is no right CVA tenderness or left CVA tenderness.     Comments: Very minimal bowel sounds heard. Also very notable tenderness to abdomen. Incision is intact and dry. HC dressing wet and removed. Reddened area to panis noted and warm to touch. (See imaging)   Musculoskeletal:     Cervical back: Normal range of motion.  Skin:    General: Skin is warm and dry.  Neurological:     Mental Status: She is alert and oriented to person, place, and time.  Psychiatric:        Mood and Affect: Mood normal.        MAU Course  Procedures Orders Placed This Encounter  Procedures   DG Abdomen 1 View   CBC with Differential/Platelet   Comprehensive metabolic panel   Meds ordered this encounter  Medications   metoCLOPramide (REGLAN) injection 10 mg   HYDROmorphone (DILAUDID) injection 1 mg    Refill:  0    MDM - Patient reports passing gas soon after evaluation.  - Consult to Dr. Earlene Plater given patient presentation. Reviewed patient lab results and concern for cellulitis vs endometritis.  - MD down to assess patient.  - Plan to admit to [facility]   Assessment and Plan  - [facility]  - RN notified of plan of care.   Claudette Head, MSN CNM  05/04/2023, 12:04 PM   Attestation of Attending Supervision of Advanced Practitioner (PA/CNM/NP): Evaluation, management, and procedures were performed by the Advanced Practitioner under my supervision and collaboration.  I have reviewed the Advanced Practitioner's note and chart, and I agree with the management and plan.  Patient seen and examined. She reports ongoing abdominal pain, diffusely and severely tender with new nausea and vomiting today. She denies fever/chills.   BP (!) 141/85 (BP Location: Right Arm)   Pulse 98   Temp 99 F (37.2 C) (Oral)   Resp 20   Ht 5\' 2"  (1.575 m)   SpO2 98%   Breastfeeding Yes   BMI 50.85 kg/m  Gen: alert, oriented Abd:  diffusely moderately tender throughout abdomen, incision c/d/I, pannus erythematous and significantly tender  A/P: G6P5 four days post partum from repeat c-section for severe pre-eclampsia presenting with worsening nausea/vomiting and severe abdominal pain. No fever, but diffusely tender throughout abdomen and erythematous pannus. Given severe abdominal pain post c-section plus new nausea/vomiting, concern for postpartum endometritis. Will admit for IV antibiotics.  Blood cultures x2 Start gent/clinda Cont home procardia Regular diet Activity as tolerated  K. Therese Sarah, MD, [facility] Attending Center for [facility] (Faculty Practice)  05/04/2023 8:21 PM

## 2023-05-04 NOTE — Consult Note (Signed)
 ANTIBIOTIC CONSULT NOTE - INITIAL  Pharmacy Consult for Gentamicin Indication: postpartum endometritis   Allergies  Allergen Reactions   Grapefruit Concentrate Swelling   Azithromycin Nausea And Vomiting and Nausea Only   Other Rash    *Aarvi Stotts Band-Aid*   Wound Dressing Adhesive Rash    Manson Passey Band-Aid*    Patient Measurements:   Adjusted Body Weight: 80.5 kg  Vital Signs: Temp: 99 F (37.2 C) (03/15 1104) Temp Source: Oral (03/15 1104) BP: 141/96 (03/15 1545) Pulse Rate: 96 (03/15 1545)  Labs: Recent Labs    05/04/23 1245  WBC 7.9  HGB 12.4  PLT 221  CREATININE 0.62   No results for input(s): "GENTTROUGH", "GENTPEAK", "GENTRANDOM" in the last 72 hours.   Microbiology: No results found for this or any previous visit (from the past 720 hours).  Medications:  Clindamycin 900 mg q8h (3/15>>  Gentamicin 400 mg q24h (3/15>>   Assessment: 28 y.o. female [id number] who is POD4 s/p rLTCS & BTS presenting with postpartum endometritis. Pharmacy has been consulted for gentamicin dosing.   Goal of Therapy:  Gentamicin peak 6-8 mg/L and Trough < 1 mg/L  Plan:  Gentamicin 400 mg IV every 24 hrs  Check Scr with next labs if gentamicin continued. Will check gentamicin levels if continued > 72hr or clinically indicated.  Janey Greaser 05/04/2023,4:31 PM

## 2023-05-05 DIAGNOSIS — O8612 Endometritis following delivery: Principal | ICD-10-CM

## 2023-05-05 DIAGNOSIS — Z79899 Other long term (current) drug therapy: Secondary | ICD-10-CM | POA: Diagnosis not present

## 2023-05-05 DIAGNOSIS — Z8249 Family history of ischemic heart disease and other diseases of the circulatory system: Secondary | ICD-10-CM | POA: Diagnosis not present

## 2023-05-05 DIAGNOSIS — Z833 Family history of diabetes mellitus: Secondary | ICD-10-CM | POA: Diagnosis not present

## 2023-05-05 DIAGNOSIS — R109 Unspecified abdominal pain: Secondary | ICD-10-CM | POA: Diagnosis present

## 2023-05-05 DIAGNOSIS — O8600 Infection of obstetric surgical wound, unspecified: Secondary | ICD-10-CM | POA: Diagnosis present

## 2023-05-05 LAB — URINE CULTURE: Culture: NO GROWTH

## 2023-05-05 NOTE — Lactation Note (Signed)
 This note was copied from a baby's chart.  NICU Lactation Consultation Note  Patient Name: Margaret Ingram [id number] Date: 05/05/2023 Age:27 days  Reason for consult: Follow-up assessment; NICU baby; Late-preterm 34-36.6wks; Other (Comment); Infant < 5lbs (severe IUGR, SGA, cHTN, Readmission to [facility])  SUBJECTIVE Visited with family of 58 67/33 weeks old AGA NICU female; Margaret Ingram is a P6 and was readmitted yesterday to [facility] due to post-partum endometriosis. She voiced pumping hasn't been consistent because of her health status. Encouraged to stay as close as she can to "Margaret Ingram" 3 hour feeding schedule. Assisted with labeling a colostrum container that she had on fridge and praised her for her efforts of providing her EBM. She has a WIC appt coming up on 05/08/2023 to get a WIC pump; she believes she should be d/c by then. Reviewed pump settings.   OBJECTIVE Infant data: Mother's Current Feeding Choice: Breast Milk and Donor Milk  O2 Device: HHFNC O2 Flow Rate (L/min): 3 L/min FiO2 (%): 21 %  Infant feeding assessment IDFTS - Readiness: 4   Maternal data: [id number] C-Section, Low Transverse Pumping frequency: 1 time/24 hours due to [facility] readmission Pumped volume: 5 mL  WIC Program: Yes WIC Referral Sent?: Yes (mom on the phone with [facility] office regarding obtaining a pump on her discharge from hospital) What county?: [place] (Lives in [place], but is active with WIC in [place]) Pump:  (Offered [facility] loaner on 05/05/2023)  ASSESSMENT Infant: Feeding Status: Scheduled 8-11-2-5 Feeding method: Tube/Gavage (Bolus)  Maternal: Milk volume: Low  INTERVENTIONS/PLAN Interventions: Interventions: Breast feeding basics reviewed; DEBP; Education  Plan: STS whenever possible Pump both breasts on maintain mode every 3 hours for 30 minutes; ideally 8 pumping sessions/24 hours P/U her pump from the [facility] office on 05/08/2023   No other support person at this time. All questions and  concerns answered, family to contact [facility] services PRN.  Consult Status: NICU follow-up NICU Follow-up type: Verify DEBP issuance   Margaret Ingram 05/05/2023, 11:46 AM

## 2023-05-05 NOTE — Progress Notes (Signed)
 POD#5  Subjective: Postpartum Day 5: Cesarean Delivery Patient reports feels better Not as tender.    Objective: Vital signs in last 24 hours: Temp:  [98.4 F (36.9 C)-99 F (37.2 C)] 98.4 F (36.9 C) (03/16 0400) Pulse Rate:  [89-124] 101 (03/16 0400) Resp:  [16-20] 16 (03/16 0400) BP: (116-151)/(68-96) 116/68 (03/16 0400) SpO2:  [96 %-100 %] 99 % (03/16 0400)  Physical Exam:  General: alert, cooperative, and no distress Lochia: appropriate Uterine Fundus: firm Incision: healing well, no significant drainage, no dehiscence, no significant erythema DVT Evaluation: No evidence of DVT seen on physical exam.  Recent Labs    05/04/23 1245  HGB 12.4  HCT 37.0    Assessment/Plan: Status post Cesarean section. Endometritis + cellulitis  Continue current care: Gent + Clinda Anticipate 72 hours IV abx.  Lazaro Arms, MD 05/05/2023, 8:43 AM

## 2023-05-06 DIAGNOSIS — L0390 Cellulitis, unspecified: Secondary | ICD-10-CM

## 2023-05-06 HISTORY — DX: Cellulitis, unspecified: L03.90

## 2023-05-06 MED ORDER — BISACODYL 10 MG RE SUPP
10.0000 mg | Freq: Once | RECTAL | Status: AC
  Administered 2023-05-06: 10 mg via RECTAL
  Filled 2023-05-06: qty 1

## 2023-05-06 MED ORDER — AMOXICILLIN-POT CLAVULANATE 875-125 MG PO TABS
1.0000 | ORAL_TABLET | Freq: Two times a day (BID) | ORAL | Status: DC
  Administered 2023-05-06 – 2023-05-07 (×2): 1 via ORAL
  Filled 2023-05-06 (×2): qty 1

## 2023-05-06 NOTE — Progress Notes (Signed)
 Patient ID: Margaret Ingram, female   DOB: [birth year], 28 y.o.   MRN: [id number]    POD #6 rLTCS readmit for cellulitis with or without endometritis Margaret Ingram is a 28 y.o. female patient.     Past Medical History:  Diagnosis Date   Anxiety    Asthma    Chronic hypertension affecting pregnancy 11/26/2022   Depression    with pregnancies   Fibroid    Headache    Heart murmur    Hx of chlamydia infection 08/17/2018   Hypertension    chronic   Infection    UTI   Pre-diabetes 06/22/2019   Pregnancy induced hypertension    Seizures (HCC)    anxiety    No past surgical history pertinent negatives on file.  Scheduled Meds:  NIFEdipine  90 mg Oral Daily   simethicone  80 mg Oral QID   sodium chloride flush  3 mL Intravenous Q12H    Continuous Infusions:  clindamycin (CLEOCIN) IV 900 mg (05/06/23 0030)   gentamicin 400 mg (05/05/23 1639)    PRN Meds:labetalol **AND** labetalol **AND** labetalol **AND** hydrALAZINE **AND** Measure blood pressure, oxyCODONE, polyethylene glycol, sodium chloride flush  Allergies  Allergen Reactions   Grapefruit Concentrate Swelling   Azithromycin Nausea And Vomiting and Nausea Only   Other Rash    *Brown Band-Aid*   Wound Dressing Adhesive Rash    Manson Passey Band-Aid*    Principal Problem:   Postpartum endometritis   Subjective   Much improved, feels much better Voiding  Normal bowel function Eating ambulatory  Objective   Vitals:   05/05/23 1408 05/05/23 2127 05/06/23 0033 05/06/23 0519  BP: (!) 129/90 (!) 147/78 (!) 146/75 125/64  Pulse: (!) 107 (!) 102 (!) 101 95  Resp: 19 18 16 16   Temp: 98.5 F (36.9 C) 98 F (36.7 C) 98.2 F (36.8 C) 98 F (36.7 C)  TempSrc: Oral Oral Oral Oral  SpO2: 95% 96% 95% 95%  Height:       Vitals:   05/04/23 1545 05/04/23 1600 05/04/23 1728 05/04/23 1842  BP: (!) 141/96 (!) 143/78 (!) 144/90 (!) 141/85   05/04/23 2044 05/04/23 2348 05/05/23 0400 05/05/23 1035  BP: (!) 142/70  132/71 116/68 120/65   05/05/23 1408 05/05/23 2127 05/06/23 0033 05/06/23 0519  BP: (!) 129/90 (!) 147/78 (!) 146/75 125/64     Subjective Objective: Vital signs (most recent): Blood pressure 125/64, pulse 95, temperature 98 F (36.7 C), temperature source Oral, resp. rate 16, height 5\' 2"  (1.575 m), SpO2 95%, currently breastfeeding.   Gen  obese female NAD Abdomen  soft improving cellulitis(which is secondary to compression of abdominal lymphatics from her pannus) Incision  clean dry intact Exam most consistent with cellulitis not impressed with endometritis primarily     Latest Ref Rng & Units 05/04/2023   12:45 PM 05/01/2023    4:20 AM 04/30/2023    3:21 AM  CBC  WBC 4.0 - 10.5 K/uL 7.9  14.0  7.9   Hemoglobin 12.0 - 15.0 g/dL 44.0  10.2  72.5   Hematocrit 36.0 - 46.0 % 37.0  31.9  35.1   Platelets 150 - 400 K/uL 221  155  174        Latest Ref Rng & Units 05/04/2023   12:45 PM 04/30/2023    3:21 AM 04/29/2023    4:18 AM  CMP  Glucose 70 - 99 mg/dL 96  366  98   BUN 6 - 20 mg/dL 13  6  5   Creatinine 0.44 - 1.00 mg/dL 1.61  0.96  0.45   Sodium 135 - 145 mmol/L 132  135  136   Potassium 3.5 - 5.1 mmol/L 4.6  3.8  3.6   Chloride 98 - 111 mmol/L 98  106  106   CO2 22 - 32 mmol/L 21  22  22    Calcium 8.9 - 10.3 mg/dL 8.8  7.4  8.5   Total Protein 6.5 - 8.1 g/dL 6.6  6.5  6.1   Total Bilirubin 0.0 - 1.2 mg/dL 0.7  0.4  0.5   Alkaline Phos 38 - 126 U/L 59  71  65   AST 15 - 41 U/L 25  19  16    ALT 0 - 44 U/L 17  13  11       Assessment & Plan POD#6 readmit for cellulitis/ +/- endometritis  Good response to IV gent + clinda Start augmentin tonight which will ocver both cellulitis and endometritis  Lazaro Arms, MD 05/06/2023, 7:29 AM

## 2023-05-06 NOTE — Lactation Note (Signed)
 This note was copied from a baby's chart.  NICU Lactation Consultation Note  Patient Name: Margaret Ingram [id number] Date: 05/06/2023 Age:27 days  Reason for consult: Follow-up assessment; Late-preterm 34-36.6wks; NICU baby; Other (Comment) (Readmisson to OBSC, SGA, Severe IUGR, GTN, PP Endometriosis)  SUBJECTIVE Mother states pumping has been going well as there is no present pain or discomfort, and is utilizing the in-patient DEBP in maintain mode.Mother reports her medications have made her tired, impacting her pumping schedule frequency. Encouraged to pump every 3 hours, but to do what is best for her as she is recovering. Discussed milk production and nipple stimulation. Engorgement prevention discussed.   OBJECTIVE Infant data: Mother's Current Feeding Choice: Breast Milk and Donor Milk  O2 Device: HHFNC O2 Flow Rate (L/min): 2 L/min FiO2 (%): 21 %  Infant feeding assessment IDFTS - Readiness: 3   Maternal data: [id number] C-Section, Low Transverse Pumping frequency: 4 times/ 24 hours Pumped volume: 15 mL Flange Size: 21  WIC Program: Yes WIC Referral Sent?: Yes (mom on the phone with [facility] office regarding obtaining a pump on her discharge from hospital) What county?: [place] (Lives in [place], but is active with WIC in [place]) Pump:  [facility] appointment 3/18.)  ASSESSMENT Infant:  Feeding Status: Scheduled 8-11-2-5 Feeding method: Tube/Gavage (Bolus)  Maternal: Milk volume: Low No signs and symptoms of engorgement.   INTERVENTIONS/PLAN Interventions: Interventions: DEBP; Breast feeding basics reviewed; Education Tools: Flanges Pump Education: Setup, frequency, and cleaning  Plan:  1) Continue pumping, ideally every 3 hours. 2) Contact WIC to maintain appointment for [facility] pump. 3) Bring pump parts from mother's room, into infant room upon discharge.  Consult Status: NICU follow-up NICU Follow-up type: Verify DEBP issuance; Verify onset of copious  milk; Weekly NICU follow up   Chinita Greenland 05/06/2023, 4:59 PM

## 2023-05-07 ENCOUNTER — Other Ambulatory Visit (HOSPITAL_COMMUNITY): Payer: Self-pay

## 2023-05-07 ENCOUNTER — Ambulatory Visit: Payer: MEDICAID

## 2023-05-07 ENCOUNTER — Telehealth (HOSPITAL_COMMUNITY): Payer: Self-pay | Admitting: *Deleted

## 2023-05-07 DIAGNOSIS — Z1331 Encounter for screening for depression: Secondary | ICD-10-CM

## 2023-05-07 LAB — CBC
HCT: 32.1 % — ABNORMAL LOW (ref 36.0–46.0)
Hemoglobin: 10.6 g/dL — ABNORMAL LOW (ref 12.0–15.0)
MCH: 29 pg (ref 26.0–34.0)
MCHC: 33 g/dL (ref 30.0–36.0)
MCV: 87.9 fL (ref 80.0–100.0)
Platelets: 223 10*3/uL (ref 150–400)
RBC: 3.65 MIL/uL — ABNORMAL LOW (ref 3.87–5.11)
RDW: 12.3 % (ref 11.5–15.5)
WBC: 7.9 10*3/uL (ref 4.0–10.5)
nRBC: 0 % (ref 0.0–0.2)

## 2023-05-07 MED ORDER — AMOXICILLIN-POT CLAVULANATE 875-125 MG PO TABS
1.0000 | ORAL_TABLET | Freq: Two times a day (BID) | ORAL | 0 refills | Status: AC
Start: 2023-05-07 — End: 2023-05-17
  Filled 2023-05-07: qty 20, 10d supply, fill #0

## 2023-05-07 NOTE — Lactation Note (Signed)
 This note was copied from a baby's chart.  NICU Lactation Consultation Note  Patient Name: Margaret Ingram [id number] Date: 05/07/2023 Age:27 days  Reason for consult: Follow-up assessment; NICU baby; Late-preterm 34-36.6wks; Maternal endocrine disorder; Other (Comment) (post partum endometritis) Type of Endocrine Disorder?: Diabetes  SUBJECTIVE  LC in to visit with P6 Mom of baby "Margaret Ingram" in the NICU.  Mom was readmitted for treatment of her post partum endometritis.  Mom is to be discharged from [facility] and has an appointment today with [facility] to obtain her Medela Symphony DEBP.  Mom reports she pumped yesterday twice and can express 15 ml.  Encouraged more frequent pumping to stimulate her milk supply.  Mom encouraged to pump 6-8 times per 24 hrs.   OBJECTIVE Infant data: Mother's Current Feeding Choice: Breast Milk and Donor Milk  O2 Device: HHFNC O2 Flow Rate (L/min): 2 L/min FiO2 (%): 21 %  Infant feeding assessment IDFTS - Readiness: 3   Maternal data: [id number] C-Section, Low Transverse Pumping frequency: Pumped twice yesterday, encouraged to pump 6-8 times per 24 hrs. Pumped volume: 15 mL Flange Size: 21  WIC Program: Yes WIC Referral Sent?: Yes (mom on the phone with [facility] office regarding obtaining a pump on her discharge from hospital) What county?: [place] (Lives in [place], but is active with WIC in [place]) Pump:  (WIC appt today 3/18 and will obtain her DEBP)  ASSESSMENT Infant:  Feeding Status: Scheduled 8-11-2-5 Feeding method: Tube/Gavage (Bolus)  Maternal: Milk volume: Low  INTERVENTIONS/PLAN Interventions: Interventions: Skin to skin; Breast massage; Hand express; DEBP; Education Tools: Pump; Flanges Pump Education: Setup, frequency, and cleaning; Milk Storage  Plan: 1- STS with baby during care times 2-Breast massage and hand express often 3- Pump both breasts 15-30 mins. 4- ask for LC prn  Consult Status: NICU follow-up NICU Follow-up  type: Verify onset of copious milk; Verify absence of engorgement; Verify DEBP issuance   Judee Clara 05/07/2023, 11:01 AM

## 2023-05-07 NOTE — Discharge Summary (Signed)
 Discharge Summary   Admit Date: 05/04/2023 Discharge Date: 05/07/2023 Discharging Service: ob/gyn  Primary OBGYN: [facility] Admitting Physician: Conan Bowens, MD  Discharge Physician: Mariel Aloe, MD  Referring Provider: n/a  Primary Care Provider: [facility]  Admission Diagnoses: Postpartum endometritis [O86.12] cellulitis  Discharge Diagnoses: Endometritis / cellulitis  Consult Orders: GENTAMICIN PER PHARMACY CONSULT MEDS TO BEDS PHARMACY CONSULT ([facility] ONLY)   Surgeries/Procedures Performed: N/a  History and Physical: See original H and P  Hospital Course: Pt was admitted 4 days postpartum with severe abdominal pain.  Skin around incision was tender to touch.  Pt also had experienced some increased some nausea and vomiting with minimal bowel movements.  Pt was admitted for IV antibiotics for presumed endometritis/cellulitis with gentamicin and clindamycin.  Pt did well and remained afebrile.  Bowel function continued to improve.  Erythema, pain and induration improved on antibiotics.  She was transitioned to oral antibiotics and discharged on hospital day 3.  Discharge Exam:  Vitals:   05/06/23 2007 05/07/23 0016 05/07/23 0417 05/07/23 1001  BP: 129/66 131/66 (!) 116/57 118/68  Pulse: 92 (!) 111 98 98  Resp: 16 18 17 16   Temp: 98.4 F (36.9 C) 98.2 F (36.8 C) 98.2 F (36.8 C) 98.3 F (36.8 C)  TempSrc: Oral Oral Oral Oral  SpO2: 96%  98% 99%  Height:        Temp:  [97.9 F (36.6 C)-98.4 F (36.9 C)] 98.3 F (36.8 C) (03/18 1001) Pulse Rate:  [92-111] 98 (03/18 1001) Resp:  [16-18] 16 (03/18 1001) BP: (116-131)/(57-72) 118/68 (03/18 1001) SpO2:  [96 %-99 %] 99 % (03/18 1001) I/O last 3 completed shifts: In: 200 [IV Piggyback:200] Out: -  No intake/output data recorded.  Intake/Output Summary (Last 24 hours) at 05/07/2023 1133 Last data filed at 05/06/2023 1710 Gross per 24 hour  Intake 150 ml  Output --   Net 150 ml     Current Vital Signs 24h Vital Sign Ranges  T 98.3 F (36.8 C) Temp  Avg: 98.2 F (36.8 C)  Min: 97.9 F (36.6 C)  Max: 98.4 F (36.9 C)  BP 118/68 BP  Min: 116/57  Max: 131/66  HR 98 Pulse  Avg: 98.8  Min: 92  Max: 111  RR 16 Resp  Avg: 17  Min: 16  Max: 18  SaO2 99 % Room Air SpO2  Avg: 97.5 %  Min: 96 %  Max: 99 %       24 Hour I/O Current Shift I/O  Time Ins Outs 03/17 0701 - 03/18 0700 In: 200  Out: -  No intake/output data recorded.   Patient Vitals for the past 12 hrs:  BP Temp Temp src Pulse Resp SpO2  05/07/23 1001 118/68 98.3 F (36.8 C) Oral 98 16 99 %  05/07/23 0417 (!) 116/57 98.2 F (36.8 C) Oral 98 17 98 %  05/07/23 0016 131/66 98.2 F (36.8 C) Oral (!) 111 18 --    Patient Vitals for the past 6 hrs:  BP Temp Temp src Pulse Resp SpO2  05/07/23 1001 118/68 98.3 F (36.8 C) Oral 98 16 99 %     Patient Vitals for the past 24 hrs:  BP Temp Temp src Pulse Resp SpO2  05/07/23 1001 118/68 98.3 F (36.8 C) Oral 98 16 99 %  05/07/23 0417 (!) 116/57 98.2 F (36.8 C) Oral 98 17 98 %  05/07/23 0016 131/66 98.2 F (36.8 C) Oral (!) 111 18 --  05/06/23 2007 129/66 98.4 F (36.9 C) Oral 92 16 96 %  05/06/23 1655 122/72 97.9 F (36.6 C) Oral 95 18 97 %    General appearance: Well nourished, well developed female in no acute distress.  Neck:  Supple, normal appearance, and no thyromegaly  Cardiovascular: regular rate and rhythm Respiratory:  Clear to auscultation bilateral. Normal respiratory effort Abdomen: positive bowel sounds and no masses, hernias; diffusely non tender to palpation, non distended Breasts: breasts appear normal, no suspicious masses, no skin or nipple changes or axillary nodes, not examined. Neuro/Psych:  Normal mood and affect.  Skin:  Warm and dry.  Lymphatic:  No inguinal lymphadenopathy.   Pelvic exam: not done  Discharge Disposition:  Home  Patient Instructions:  Follow up in 1 week for wound check   Results  Pending at Discharge:  none  Discharge Medications: Allergies as of 05/07/2023       Reactions   Grapefruit Concentrate Swelling   Azithromycin Nausea And Vomiting, Nausea Only   Other Rash   *Brown Band-Aid*   Wound Dressing Adhesive Rash   *Brown Band-Aid*        Medication List     STOP taking these medications    cyclobenzaprine 10 MG tablet Commonly known as: FLEXERIL       TAKE these medications    Acetaminophen Extra Strength 500 MG Tabs Take 2 tablets (1,000 mg total) by mouth every 6 (six) hours.   amoxicillin-clavulanate 875-125 MG tablet Commonly known as: AUGMENTIN Take 1 tablet by mouth every 12 (twelve) hours for 10 days.   furosemide 20 MG tablet Commonly known as: LASIX Take 1 tablet (20 mg total) by mouth daily.   ibuprofen 600 MG tablet Commonly known as: ADVIL Take 1 tablet (600 mg total) by mouth every 6 (six) hours.   multivitamin-prenatal 27-0.8 MG Tabs tablet Take 1 tablet by mouth daily.   NIFEdipine 90 MG 24 hr tablet Commonly known as: ADALAT CC Take 1 tablet (90 mg total) by mouth daily.   ondansetron 4 MG disintegrating tablet Commonly known as: ZOFRAN-ODT Take 1 tablet (4 mg total) by mouth every 8 (eight) hours as needed for nausea.   oxyCODONE 5 MG immediate release tablet Commonly known as: Oxy IR/ROXICODONE Take 1 tablet (5 mg total) by mouth every 4 (four) hours as needed for breakthrough pain.   potassium chloride SA 20 MEQ tablet Commonly known as: KLOR-CON M Take 1 tablet (20 mEq total) by mouth daily.   Senna-S 8.6-50 MG tablet Generic drug: senna-docusate Take 2 tablets by mouth at bedtime as needed for mild constipation.               Discharge Care Instructions  (From admission, onward)           Start     Ordered   05/07/23 0000  Discharge wound care:       Comments: Keep area clean and dry, may shower, allow soap and water to run across incision, but dry afterwards   05/07/23 1045              Future Appointments  Date Time Provider Department Center  05/17/2023 10:15 AM Warden Fillers, MD [facility]  06/20/2023  1:55 PM Adam Phenix, MD [facility]    Mariel Aloe, MD Attending Center for [facility])

## 2023-05-07 NOTE — Telephone Encounter (Signed)
 Patient scored 16 on the EPDS in the hospital, with answer to question ten being "0-Never".  Placed order for [facility] referral.  Dr. Berton Lan notified via [facility] order.  Salena Saner, RN 05/07/2023 09:53

## 2023-05-09 ENCOUNTER — Ambulatory Visit: Payer: MEDICAID

## 2023-05-09 ENCOUNTER — Encounter: Payer: MEDICAID | Admitting: Obstetrics and Gynecology

## 2023-05-09 LAB — CULTURE, BLOOD (ROUTINE X 2)
Culture: NO GROWTH
Culture: NO GROWTH

## 2023-05-13 ENCOUNTER — Telehealth (HOSPITAL_COMMUNITY): Payer: Self-pay | Admitting: *Deleted

## 2023-05-13 NOTE — Telephone Encounter (Signed)
 05/13/2023  Name: Margaret Ingram MRN: [id number] DOB: 1995/03/22  Reason for Call:  Transition of Care Hospital Discharge Call  Contact Status: Patient Contact Status: Complete  Language assistant needed: Interpreter Mode: Interpreter Not Needed        Follow-Up Questions: Do You Have Any Concerns About Your Health As You Heal From Delivery?: Yes What Concerns Do You Have About Your Health?: Has been vomiting this morning, and is having increased abdominal pain - she thinks pain increase may be due to not being able to hold down Tylenol and ibuprofen which had been helping. Has been tolerating oral antibiotics up to this morning. Denies fever.  LBM this morning.  Encouraged patient to try drinking clear liquids and to try eating something bland like toast or crackers.  Advised her to call provider or come to MAU if symptoms do not resolve in a few hours.  Concern is for dehydration, the need for antibiotics via another route if PO is not tolerated, and to determine/treat underlying cause of vomiting if it persists. Do You Have Any Concerns About Your Infants Health?: Infant in NICU  Edinburgh Postnatal Depression Scale:  In the Past 7 Days: I have been able to laugh and see the funny side of things.: As much as I always could I have looked forward with enjoyment to things.: Rather less than I used to I have blamed myself unnecessarily when things went wrong.: Yes, most of the time I have been anxious or worried for no good reason.: Yes, sometimes I have felt scared or panicky for no good reason.: Yes, sometimes Things have been getting on top of me.: Yes, most of the time I haven't been able to cope at all I have been so unhappy that I have had difficulty sleeping.: Not at all I have felt sad or miserable.: Not very often I have been so unhappy that I have been crying.: Yes, most of the time The thought of harming myself has occurred to me.: Never Edinburgh Postnatal Depression Scale  Total: (!) 15  PHQ2-9 Depression Scale:     Discharge Follow-up: Edinburgh score requires follow up?: Yes Provider notified of Edinburgh score?: Yes Have you already been referred for a counseling appointment?: Yes (IB[facility] referral currently in place, no appt yet.) Patient was advised of the following resources:: Support Group, Breastfeeding Support Group  Post-discharge interventions: Reviewed Newborn Safe Sleep Practices Maternal Mental Health Resources provided  Salena Saner, RN 05/13/2023  11:56

## 2023-05-13 NOTE — Op Note (Signed)
 Cesarean Section Operative Note   Patient: Margaret Ingram  Date of Procedure: 04/30/2023  Procedure: Repeat Low Transverse Cesarean and Bilateral Tubal Ligation via Bilateral salpingectomy   Indications: previous uterine incision: low transverse and undesired fertility, hx C/S x2, cerclage in place  Pre-operative Diagnosis: repeat cesarean section with bilateral tubal ligation, cerclage removal, cHTN w/SIPE  Post-operative Diagnosis: Same and Bilateral Tubal Sterilization via Bilateral salpingectomy  TOLAC Candidate: No  Surgeon: Surgeons and Role:    * Celedonio Savage, MD - Primary    * Adam Phenix, MD - Assisting    * Joanne Gavel, MD - Fellow  Assistants: An experienced assistant was required given the standard of surgical care given the complexity of the case.  This assistant was needed for exposure, dissection, suctioning, retraction, instrument exchange, assisting with delivery with administration of fundal pressure, and for overall help during the procedure.   Anesthesia: spinal  Anesthesiologist: Responsible provider cannot be found from this context.   Antibiotics: Cefazolin   Estimated Blood Loss: 489 ml   Total IV Fluids: 1227 ml  Urine Output:  350 cc OF clear urine  Specimens: bilateral fallopian tubes to pathology  Complications:  severe adhesive disease    Indications: Margaret Ingram is a 28 y.o. [id number] with an IUP [redacted]w[redacted]d presenting for scheduled cesarean secondary to the indications listed above. Clinical course notable for initial admission to antepartum 3/6 for vaginal bleeding, severe IUGR, cHTN w/SIPE. Received BMZ course.  The risks of cesarean section were discussed with the patient including but were not limited to: bleeding which may require transfusion or reoperation; infection which may require antibiotics; injury to bowel, bladder, ureters or other surrounding organs; injury to the fetus; need for additional procedures including  hysterectomy in the event of a life-threatening hemorrhage; placental abnormalities wth subsequent pregnancies, incisional problems, thromboembolic phenomenon and other postoperative/anesthesia complications.  Patient also desires permanent sterilization.  Other reversible forms of contraception were discussed with patient; she declines all other modalities. Risks of procedure discussed with patient including but not limited to: risk of regret, permanence of method, bleeding, infection, injury to surrounding organs and need for additional procedures.  Failure risk of about 1% with increased risk of ectopic gestation if pregnancy occurs was also discussed with patient.  Also discussed possibility of post-tubal pain syndrome. The patient concurred with the proposed plan, giving informed written consent for the procedures.  Patient has been NPO since last night she will remain NPO for procedure. Anesthesia and OR aware.  Preoperative prophylactic antibiotics and SCDs ordered on call to the OR.   Findings: Viable infant in cephalic presentation, no nuchal cord present. Apgars 1, 7, 9. Weight 1.61 kg. Clear amniotic fluid. Normal placenta, three vessel cord. Uterine window present in lower uterine segment with increased vascularity, Normal bilateral fallopian tubes, Normal bilateral ovaries. Severe adhesive disease, especially between rectus, peritoneum, uterus .  Procedure Details: A Time Out was held and the above information confirmed. The patient received intravenous antibiotics and had sequential compression devices applied to her lower extremities preoperatively. The patient was taken back to the operative suite where spinal anesthesia was administered. After induction of anesthesia patient was put in stirrups and cerclage suture was visualized and cut.  Cerclage removed without difficulty.  The patient was then draped and prepped in the usual sterile manner and placed in a dorsal supine position with a  leftward tilt. A low transverse skin incision was made with scalpel and carried down through the subcutaneous tissue  to the fascia. Fascial incision was made and extended sharply transversely. The fascia was separated from the underlying rectus tissue superiorly and inferiorly via sharp dissection. The rectus muscles were separated in the midline sharply and the peritoneum was entered sharply. Extensive, severe adhesive disease encountered. An Alexis retractor was placed to aid in visualization of the uterus. A bladder flap was not developed. A low transverse uterine incision was made. The infant was successfully delivered from cephalic presentation, the umbilical cord was clamped after 1 minute. Cord ph was not sent, and cord blood was obtained for evaluation. The placenta was removed Intact and appeared normal. The uterine incision was closed with a single layer running unlocked suture of 0-Monocryl. Overall, excellent hemostasis was noted. Bleeding noted from vessel in lower uterine segment/near bladder. Additional stitch and arista placed for hemostasis.  Attention was then turned to the fallopian tubes. The left Fallopian tube was identified and then traced to it's fimbriae. Using the Ligasure device and taking care to avoid large vascular structures, the left fallopian tube was removed sequentially from the fimbriae to the cornua, with excellent hemostasis noted. Attention was then turned to the right fallopian tube, and after confirmation of identification by tracing the tube out to the fimbriae, the same procedure was then performed on with excellent hemostasis noted.  The abdomen and pelvis were cleared of all clot and debris and the Jon Gills was removed. Hemostasis was confirmed on all surfaces.  The peritoneum/rectus/scar tissue was reapproximated using 2-0 vicryl  to contain bowel. The fascia was then closed using 0 Vicryl in a running fashion. Defect in scarpa's noted; this was closed with additional  sutures. The subcutaneous layer was reapproximated with 2-0 plain gut suture. The skin was closed with a 4-0 vicryl subcuticular stitch. The patient tolerated the procedure well. Sponge, lap, instrument and needle counts were correct x 2. She was taken to the recovery room in stable condition.  Disposition: PACU - hemodynamically stable.    Signed: Celedonio Savage, MD Center for [facility] Healthcare, [facility]

## 2023-05-16 ENCOUNTER — Encounter: Payer: MEDICAID | Admitting: Family Medicine

## 2023-05-16 ENCOUNTER — Ambulatory Visit: Payer: MEDICAID

## 2023-05-17 ENCOUNTER — Ambulatory Visit: Payer: MEDICAID | Admitting: Obstetrics and Gynecology

## 2023-05-20 ENCOUNTER — Ambulatory Visit: Payer: MEDICAID | Admitting: Obstetrics and Gynecology

## 2023-05-23 ENCOUNTER — Ambulatory Visit (HOSPITAL_COMMUNITY): Payer: Self-pay

## 2023-05-23 NOTE — Lactation Note (Signed)
 This note was copied from a baby's chart.  NICU Lactation Consultation Note  Patient Name: Margaret Ingram Date: 05/23/2023 Age:27 wk.o.  Reason for consult: Weekly NICU follow-up; NICU baby; Early term 37-38.6wks; Other (Comment); Infant < 5lbs; Maternal endocrine disorder (severe IUGR, SGA, cHTN) Type of Endocrine Disorder?: Diabetes (GDM)  SUBJECTIVE Visited with family of 22 27/35 weeks old AGA NICU female "Margaret Ingram"; Ms. Condie reported she's no longer pumping, she said that every time she tried she kept getting less and less, her last pumping session was on 05/19/2022. She inquired if she could continue "trying"; she wasn't able to pick up her pump from the [facility] office but her mom got her a wearable pump to use at home. Noticed that baby has been on donor milk since birth and then transitioned to formula. Explained that if she would like to try again, she could but that her supply is most likely gone at 3 weeks post-partum without pumping, her breasts are soft now and they feel "back to normal". Revised re-lactation, and let her know that we'll still be supportive of her feeding choice as long as baby is getting fed. FOB present. All questions and concerns answered, LC services available upon request PRN.   OBJECTIVE Infant data: Mother's Current Feeding Choice: Formula  O2 Device: Room Air  Infant feeding assessment IDFTS - Readiness: 2 IDFTS - Quality: 2   Maternal data: [id number] C-Section, Low Transverse No data recorded WIC Program: Yes WIC Referral Sent?: Yes (mom on the phone with [facility] office regarding obtaining a pump on her discharge from hospital) What county?: [place] (Lives in [place], but is active with WIC in [place]) Pump: [employer] (off brand, it was a gift)  ASSESSMENT Infant: Feeding Status: Scheduled 8-11-2-5 Feeding method: Bottle; Tube/Gavage (Bolus) Nipple Type: Dr. Levert Feinstein Preemie  Maternal: Not  pumping  INTERVENTIONS/PLAN Interventions: Interventions: Education Discharge Education: Engorgement and breast care  Plan: Consult Status: Complete   Hayzel Ruberg S Shataria Crist 05/23/2023, 2:15 PM

## 2023-05-24 ENCOUNTER — Telehealth: Payer: Self-pay

## 2023-05-24 NOTE — Telephone Encounter (Signed)
 RN notified by clinical staff Jamie regarding patient is endorsing headaches and BP of 200/155 during a phone call.  RN called patient regarding these symptoms. Patient reports having headaches for the last two days and recent BP was 176/98. Patient reports being compliant of BP medication Nifedipine 90 mg daily. Last doses taken yesterday morning. Advised patient to go to the MAU to be further evaluated. Patient voiced understanding and voiced that she will go to the MAU.   Marcelino Duster, RN
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
## 2023-06-17 ENCOUNTER — Ambulatory Visit: Payer: MEDICAID | Admitting: Obstetrics and Gynecology

## 2023-06-19 ENCOUNTER — Ambulatory Visit: Payer: MEDICAID

## 2023-06-19 DIAGNOSIS — F4323 Adjustment disorder with mixed anxiety and depressed mood: Secondary | ICD-10-CM

## 2023-06-19 DIAGNOSIS — Z658 Other specified problems related to psychosocial circumstances: Secondary | ICD-10-CM

## 2023-06-19 NOTE — Patient Instructions (Signed)
 Center for [facility] at [facility] [address] [place], [place] [address] [phone] (main office) [phone] [facility] office)  [facility] [address] [place], [place]  [address]  ADHD testing Learning disorder testing Psychological evaluations  https://www.hughes-[email or web address]/ [phone]   [place] Development  (Childcare options, Early childcare development, etc.) www.gu[place]children.org  [place]  Child Care Facility Search Engine  https://ncchildcare.http://cook.com/   Housing Resources                    [employer] (serves [place], [place], [place], [place], [place], [place], [place], [place], [place], [place], [place], [place], and [place] counties) [address] [place], [place] [address] [phone] DeveloperU.ch  **Rental assistance, Home Rehabilitation,Weatherization Assistance Program, Chief Financial Officer, Housing Voucher Program  [employer] for Housing and [employer] Studies: Proofreader Resources to residents of [place], [place], and [place] Make sure you have your documents ready, including:  (Household income verification: 2 months pay stubs, unemployment/social security award letter, statement of no income for all household members over 29) Photo ID for all household members over 18 Utility Bill/Rent Ledger/Lease: must show past due amount for utilities/rent, or the rental agreement if rent is current 2. Start your application online or by paper (in [place] or [place]) at:     [email or web address].castaneda.[email or web address]  3. Once you have completed the online application, you will get an email confirmation message from the county. Expect to hear back by phone or email at least 6-10 weeks from submitting your application.  4. While you wait:  Call [phone] to  check in on your application Let your landlord know that you've applied. Your landlord will be asked to submit documents (W-9) during this application process. Payments will be made directly to the landlord/property management company and utility company Rent or utility assistance for [employer], [place], and [place] Apply at https://rb.gy/dvxbfv Questions? Call or email Renee at [phone] or [email or web address]   Eviction Mediation Program: The HOPE Program Https://www.rebuild.BedroomRental.com.cy HOPE Progam serves low-income renters in 55 [place]  counties, defined as less than or equal to 80% of the area median income for the county where the renter lives. In the following 12 counties, you should apply to your local rent and utility assistance program INSTEAD OF the HOPE Program: [place], [place], [place], [place], [place], [place], [place], [place], [place], [place], [place], [place]  If you live outside of [place], contact HOPE call center at [phone] to talk to a Program Representative Monday-Friday, 8am-5pm Note that Native American tribes also received federal funding for rent and utility assistance programs. Recognized members of the following tribes will be served by programs managed by tribal governments, including: [place] Indians, [place], [place], [place] of [place]  and [facility] Eviction Mediation Coordinator, [place], [phone] [email or web address] .[employer], [place], [phone] [email or web address]   Housing Resources [facility] [place] [address], [place], [place] [address] [phone] www.gha-[place].[facility] [address] Marya Smack [place], [place] [address] [phone] PhoneCaptions.ch **Programs include: Hospital doctor and Housing Counseling, Healthy Radiographer, therapeutic, Homeless  Prevention and Housing Assistance  Government [facility] [address], [place], [place] [address] [phone] www.PaintingEmporium.co.za **housing applications/recertification; tax payment relief/exemption under specific qualifications  [facility] [address] [place], [place] [address] www.onlinegreensboro.com/~maryshouse **transitional housing for women in recovery who have minor children or are pregnant  [facility] [phone]  [address] [place], [place], [place] [address] https://johnson-smith.net/  **emergency shelter and support services for families facing homelessness  [facility] [address] [place], [place] [address] [phone] www.youthfocus.org **transitional housing to pregnant women; emergency housing for youth who have run away, are experiencing a family crisis, are victims of abuse or neglect, or are homeless  [facility] [address], [place], [place] [address] [phone] ircgso.org **Drop-in center for people experiencing homelessness; overnight warming center when temperature is 25 degrees or below  Re[employer]Entry [employer]ing [address] [place], [place], [place] [address] [phone] https://reentrystaffingagency.org/ **help with affordable housing to people experiencing homelessness or unemployment due to incarceration  [facility] [address], [place], [place] [address] [phone] www.greensborourbanministry.org  **emergency and transitional housing, rent/mortgage assistance, utility assistance  [facility] [address], [place], [place] [address] [phone] www.salvationarmyofgreensboro.org **emergency and transitional housing  Habitat for [employer] [address], [place], [place] [address] [phone] Www.habitatgreensboro.[facility]   [employer] [address], [place], [place] [address] [phone] https://chshousing.org **Home Ownership/Affordable Housing Program and [facility]  [employer] [facility] [address], [place], [place] [address] [phone] PaidValue.com.cy **home buyer education courses, foreclosure prevention  [facility] [address], [place], [place] [address] [phone] DefMagazine.is **Environmental Exposure Assessment (investigation of homes where either children or pregnant women with a confirmed elevated blood lead level reside)  [place]  [facility] [address] Maurie Southern [place], [place] [address] [phone] ShowReturn.ca **Home Expense Assistance/Repairs Program; offers home accessibility updates, such as ramps or bars in the bathroom  [facility] [address], [place], [place] [address] [phone] https://www.self-help.org/locations/[place]-branch **Offers credit-building and banking services to people unable to use traditional banking   Housing Resources [facility]  Housing Authority- [place] of [facility] [address] Berton Brock Iglesia [place], [place] [address] [phone] ChatRepair.pl   [facility] [address] [place], [place] [address] [phone]  WrestlingMonthly.pl **housing applications/recertification, emergency and transitional housing  Open [employer] of [employer] [address], [place], [place] [address] [phone] www.odm-hp.org  **emergency and permanent housing; rent/mortgage payment assistance  Habitat for [employer], [place] and [place] [address], [place], [place] [address] [phone] https://russell-walls.com/  Family Services of the [place], [employer] [address] [place], [place], [place] [address] www.familyservice-piedmont.org **emergency shelter  for victims of domestic violence and sexual assault  [facility] [address], [place], [place] [address] [phone] www.senior-resources-guilford.org **Home expense assistance/repairs for older adults  Housing Resources [facility] [address] [place], [place] [address] [phone] http://cohen-reilly.biz/  **May offer help with minor housing repairs  [facility] [address], [place], [place] [address] [phone] **emergency housing for victims of domestic violence  Housing Resources [place], [place], [place] [facility])  [facility] [address], [place], [place] [address] [phone] www.newrha.[facility] [address] [place], [place] [address] [phone]  [facility] [address], [place], [place] [address] [phone] www.co.[place].us  **Housing applications/recertification; tax payment relief/exemption w specific qualifications  [facility] Help for Homeless [address], [place], [place] [address] [phone] Http://www.rchelpforhomeless.org/HOME_PAGE.html  HELP, Incorporated [facility] [address] [place], [place] [address] [phone] 24 Hour Crisis Line [phone] Hours of Operation: Monday-Friday, 8:30am-5:00pm http://helpincorporated.org **Includes emergency housing for victims of domestic violence

## 2023-06-20 ENCOUNTER — Ambulatory Visit: Payer: MEDICAID | Admitting: Obstetrics & Gynecology

## 2023-06-27 NOTE — BH Specialist Note (Signed)
 Pt did not arrive to video visit and did not answer the phone; Unable to leave voice message as mailbox is full; left MyChart message for patient.

## 2023-07-11 ENCOUNTER — Ambulatory Visit: Payer: MEDICAID | Admitting: Clinical

## 2023-07-11 DIAGNOSIS — Z91199 Patient's noncompliance with other medical treatment and regimen due to unspecified reason: Secondary | ICD-10-CM

## 2023-08-05 ENCOUNTER — Encounter (HOSPITAL_COMMUNITY): Payer: Self-pay

## 2023-08-05 ENCOUNTER — Ambulatory Visit (HOSPITAL_COMMUNITY)
Admission: EM | Admit: 2023-08-05 | Discharge: 2023-08-05 | Disposition: A | Discharge status: Home / Self Care | Payer: MEDICAID | Attending: Internal Medicine | Admitting: Internal Medicine

## 2023-08-05 ENCOUNTER — Other Ambulatory Visit: Payer: Self-pay

## 2023-08-05 ENCOUNTER — Emergency Department (HOSPITAL_COMMUNITY): Payer: MEDICAID

## 2023-08-05 ENCOUNTER — Emergency Department (HOSPITAL_COMMUNITY)
Admission: EM | Admit: 2023-08-05 | Discharge: 2023-08-05 | Disposition: A | Discharge status: Home / Self Care | Payer: MEDICAID

## 2023-08-05 DIAGNOSIS — R1013 Epigastric pain: Secondary | ICD-10-CM

## 2023-08-05 DIAGNOSIS — K439 Ventral hernia without obstruction or gangrene: Secondary | ICD-10-CM

## 2023-08-05 DIAGNOSIS — R109 Unspecified abdominal pain: Secondary | ICD-10-CM

## 2023-08-05 DIAGNOSIS — K920 Hematemesis: Secondary | ICD-10-CM | POA: Diagnosis not present

## 2023-08-05 LAB — URINALYSIS, ROUTINE W REFLEX MICROSCOPIC
Bilirubin Urine: NEGATIVE
Glucose, UA: NEGATIVE mg/dL
Hgb urine dipstick: NEGATIVE
Ketones, ur: NEGATIVE mg/dL
Nitrite: NEGATIVE
Protein, ur: NEGATIVE mg/dL
Specific Gravity, Urine: 1.012 (ref 1.005–1.030)
pH: 6 (ref 5.0–8.0)

## 2023-08-05 LAB — CBC WITH DIFFERENTIAL/PLATELET
Abs Immature Granulocytes: 0.02 10*3/uL (ref 0.00–0.07)
Basophils Absolute: 0 10*3/uL (ref 0.0–0.1)
Basophils Relative: 0 %
Eosinophils Absolute: 0 10*3/uL (ref 0.0–0.5)
Eosinophils Relative: 1 %
HCT: 34.7 % — ABNORMAL LOW (ref 36.0–46.0)
Hemoglobin: 11.2 g/dL — ABNORMAL LOW (ref 12.0–15.0)
Immature Granulocytes: 0 %
Lymphocytes Relative: 25 %
Lymphs Abs: 1.5 10*3/uL (ref 0.7–4.0)
MCH: 28.1 pg (ref 26.0–34.0)
MCHC: 32.3 g/dL (ref 30.0–36.0)
MCV: 87 fL (ref 80.0–100.0)
Monocytes Absolute: 0.5 10*3/uL (ref 0.1–1.0)
Monocytes Relative: 7 %
Neutro Abs: 4 10*3/uL (ref 1.7–7.7)
Neutrophils Relative %: 67 %
Platelets: 209 10*3/uL (ref 150–400)
RBC: 3.99 MIL/uL (ref 3.87–5.11)
RDW: 11.8 % (ref 11.5–15.5)
WBC: 6.1 10*3/uL (ref 4.0–10.5)
nRBC: 0 % (ref 0.0–0.2)

## 2023-08-05 LAB — COMPREHENSIVE METABOLIC PANEL WITH GFR
ALT: 13 U/L (ref 0–44)
AST: 18 U/L (ref 15–41)
Albumin: 3.4 g/dL — ABNORMAL LOW (ref 3.5–5.0)
Alkaline Phosphatase: 67 U/L (ref 38–126)
Anion gap: 7 (ref 5–15)
BUN: 11 mg/dL (ref 6–20)
CO2: 21 mmol/L — ABNORMAL LOW (ref 22–32)
Calcium: 8.7 mg/dL — ABNORMAL LOW (ref 8.9–10.3)
Chloride: 108 mmol/L (ref 98–111)
Creatinine, Ser: 0.69 mg/dL (ref 0.44–1.00)
GFR, Estimated: >60 mL/min (ref >60)
Glucose, Bld: 98 mg/dL (ref 70–99)
Potassium: 4.2 mmol/L (ref 3.5–5.1)
Sodium: 136 mmol/L (ref 135–145)
Total Bilirubin: 0.6 mg/dL (ref 0.0–1.2)
Total Protein: 7.1 g/dL (ref 6.5–8.1)

## 2023-08-05 LAB — HCG, SERUM, QUALITATIVE: Preg, Serum: NEGATIVE

## 2023-08-05 LAB — LIPASE, BLOOD: Lipase: 22 U/L (ref 11–51)

## 2023-08-05 MED ORDER — ALUM & MAG HYDROXIDE-SIMETH 200-200-20 MG/5ML PO SUSP
30.0000 mL | Freq: Once | ORAL | Status: AC
  Administered 2023-08-05: 30 mL via ORAL

## 2023-08-05 MED ORDER — ONDANSETRON 4 MG PO TBDP
4.0000 mg | ORAL_TABLET | Freq: Three times a day (TID) | ORAL | 0 refills | Status: AC | PRN
Start: 2023-08-05 — End: ?

## 2023-08-05 MED ORDER — LIDOCAINE VISCOUS HCL 2 % MT SOLN
15.0000 mL | Freq: Once | OROMUCOSAL | Status: AC
  Administered 2023-08-05: 15 mL via OROMUCOSAL

## 2023-08-05 MED ORDER — MORPHINE SULFATE (PF) 4 MG/ML IV SOLN
4.0000 mg | Freq: Once | INTRAVENOUS | Status: AC
  Administered 2023-08-05: 4 mg via INTRAVENOUS
  Filled 2023-08-05: qty 1

## 2023-08-05 MED ORDER — ONDANSETRON HCL 4 MG/2ML IJ SOLN
4.0000 mg | Freq: Once | INTRAMUSCULAR | Status: AC
  Administered 2023-08-05: 4 mg via INTRAVENOUS
  Filled 2023-08-05: qty 2

## 2023-08-05 MED ORDER — ALUM & MAG HYDROXIDE-SIMETH 200-200-20 MG/5ML PO SUSP
ORAL | Status: AC
  Filled 2023-08-05: qty 30

## 2023-08-05 MED ORDER — LIDOCAINE VISCOUS HCL 2 % MT SOLN
OROMUCOSAL | Status: AC
  Filled 2023-08-05: qty 15

## 2023-08-05 MED ORDER — PANTOPRAZOLE SODIUM 40 MG IV SOLR
80.0000 mg | Freq: Once | INTRAVENOUS | Status: AC
  Administered 2023-08-05: 80 mg via INTRAVENOUS
  Filled 2023-08-05: qty 20

## 2023-08-05 MED ORDER — ONDANSETRON 4 MG PO TBDP
ORAL_TABLET | ORAL | Status: AC
  Filled 2023-08-05: qty 1

## 2023-08-05 MED ORDER — LACTATED RINGERS IV BOLUS
1000.0000 mL | Freq: Once | INTRAVENOUS | Status: AC
  Administered 2023-08-05: 1000 mL via INTRAVENOUS

## 2023-08-05 MED ORDER — PANTOPRAZOLE SODIUM 20 MG PO TBEC: 20.0000 mg | DELAYED_RELEASE_TABLET | Freq: Every day | ORAL | 0 refills | Status: AC

## 2023-08-05 MED ORDER — IOHEXOL 350 MG/ML SOLN
75.0000 mL | Freq: Once | INTRAVENOUS | Status: AC | PRN
  Administered 2023-08-05: 75 mL via INTRAVENOUS

## 2023-08-05 MED ORDER — ONDANSETRON 4 MG PO TBDP
4.0000 mg | ORAL_TABLET | Freq: Once | ORAL | Status: AC
Start: 2023-08-05 — End: 2023-08-05
  Administered 2023-08-05: 4 mg via ORAL

## 2023-08-05 NOTE — ED Provider Notes (Addendum)
 [facility]    CSN: [id number] Arrival date & time: 08/05/23  1232      History   Chief Complaint Chief Complaint  Patient presents with   Abdominal Pain    HPI Margaret Ingram is a 28 y.o. female.   Patient presents to urgent care for evaluation of sudden onset severe epigastric abdominal pain and hematemesis that started abruptly this morning.  She has had a few episodes of nausea with vomiting.  States emesis is clear with clots of red.  Epigastric pain is constant, radiates from the central chest to the epigastric abdomen, and is currently a 9 on a scale 0-10.  She has never had this type of pain in the past.  Denies history of GERD.  No recent intake of alcohol, NSAIDs, spicy/acidic foods.  Reports dizziness with position changes, feeling clammy/sweaty, and persistent nausea.  Denies recent diarrhea, fever, chills, lower abdominal pain, flank pain, urinary symptoms, and chance of pregnancy.  No blood or mucus in the stools.  She had a salpingectomy with her last cesarean section 3 months ago.  Reports minimal shortness of breath associated with chest/epigastric pain.  History of hypertension, takes nifedipine  daily.  Denies history of acute cardiovascular event.  She is not currently breast-feeding.  She has not attempted use of any over-the-counter medications no for symptoms PTA.   Abdominal Pain   Past Medical History:  Diagnosis Date   Anxiety    Asthma    Chronic hypertension affecting pregnancy 11/26/2022   Depression    with pregnancies   Fibroid    Headache    Heart murmur    Hx of chlamydia infection 08/17/2018   Hypertension    chronic   Infection    UTI   Pre-diabetes 06/22/2019   Pregnancy induced hypertension    Seizures (HCC)    anxiety    Patient Active Problem List   Diagnosis Date Noted   Cellulitis 05/06/2023   Postpartum endometritis 05/04/2023   Status post repeat low transverse cesarean section 04/30/2023   Status post  bilateral salpingectomy 04/30/2023   History of C-section x 2 03/25/2023   Unwanted fertility 03/20/2023   IUGR (intrauterine growth restriction) affecting care of mother, third trimester 03/14/2023   BMI 45.0-49.9, adult (HCC) 03/01/2023   Obesity in pregnancy 03/01/2023   HSV (herpes simplex virus) infection 03/01/2023   Vaginal bleeding in pregnancy, third trimester 02/28/2023   History of incompetent cervix, currently pregnant in third trimester 02/28/2023   Cervical cerclage suture present in third trimester 02/28/2023   Supervision of high risk pregnancy, antepartum 02/19/2023   Chronic hypertension in pregnancy 11/26/2022   History of preterm labor, current pregnancy, third trimester    History of severe pre-eclampsia 08/17/2018   Chronic hypertension 08/17/2018   Depression 08/17/2018   History of gestational diabetes mellitus (GDM) 03/26/2018    Past Surgical History:  Procedure Laterality Date   CERVICAL CERCLAGE  01/28/2023   CERVICAL CERCLAGE  10/2020   rescue cerclage   CESAREAN SECTION N/A 06/22/2019   Procedure: CESAREAN SECTION;  Surgeon: Tresia Fruit, MD;  Location: [facility] LD [facility];  Service: Obstetrics;  Laterality: N/A;  STAT Cord prolapse Liletta    CESAREAN SECTION  03/2021   and cerclage removal   CESAREAN SECTION     CESAREAN SECTION N/A 04/30/2023   Procedure: CESAREAN DELIVERY;  Surgeon: Ferdie Housekeeper, MD;  Location: [facility];  Service: Obstetrics;  Laterality: N/A;   WISDOM TOOTH EXTRACTION  OB History     Gravida  6   Para  6   Term  2   Preterm  4   AB      Living  5      SAB      IAB      Ectopic      Multiple  0   Live Births  6            Home Medications    Prior to Admission medications   Medication Sig Start Date End Date Taking? Authorizing Provider  acetaminophen  (TYLENOL ) 500 MG tablet Take 2 tablets (1,000 mg total) by mouth every 6 (six) hours. 05/03/23   Izell Marsh, MD  furosemide  (LASIX ) 20 MG  tablet Take 1 tablet (20 mg total) by mouth daily. 05/04/23   Izell Marsh, MD  ibuprofen  (ADVIL ) 600 MG tablet Take 1 tablet (600 mg total) by mouth every 6 (six) hours. 05/03/23   Izell Marsh, MD  NIFEdipine  (ADALAT  CC) 90 MG 24 hr tablet Take 1 tablet (90 mg total) by mouth daily. 05/04/23   Izell Marsh, MD  ondansetron  (ZOFRAN -ODT) 4 MG disintegrating tablet Take 1 tablet (4 mg total) by mouth every 8 (eight) hours as needed for nausea. 04/04/23   [place]an[place] Layer, MD  oxyCODONE  (OXY IR/ROXICODONE ) 5 MG immediate release tablet Take 1 tablet (5 mg total) by mouth every 4 (four) hours as needed for breakthrough pain. 05/03/23   Izell Marsh, MD  potassium chloride  SA (KLOR-CON  M) 20 MEQ tablet Take 1 tablet (20 mEq total) by mouth daily. 05/04/23   Izell Marsh, MD  Prenatal Vit-Fe Fumarate-FA (MULTIVITAMIN-PRENATAL) 27-0.8 MG TABS tablet Take 1 tablet by mouth daily. 11/26/22   Almond Army, CNM  senna-docusate (SENOKOT-S) 8.6-50 MG tablet Take 2 tablets by mouth at bedtime as needed for mild constipation. 05/03/23   Izell Marsh, MD    Family History Family History  Problem Relation Age of Onset   Asthma Mother    Healthy Father    Cancer Maternal Grandmother    Gout Paternal Grandmother    Hypertension Paternal Grandmother    Diabetes Paternal Grandmother    Gout Paternal Uncle    Hypertension Paternal Uncle    Kidney failure Paternal Uncle    Diabetes Other     Social History Social History   Tobacco Use   Smoking status: Never   Smokeless tobacco: Never   Tobacco comments:    occ  Vaping Use   Vaping status: Never Used  Substance Use Topics   Alcohol use: Not Currently    Comment: occasionally    Drug use: Not Currently    Types: Marijuana    Comment: not since I got pregnant     Allergies   Grapefruit concentrate, Azithromycin , Other, and Wound dressing adhesive   Review of Systems Review of Systems  Gastrointestinal:   Positive for abdominal pain.  Per HPI   Physical Exam Triage Vital Signs ED Triage Vitals  Encounter Vitals Group     BP 08/05/23 1239 (!) 147/79     Girls Systolic BP Percentile --      Girls Diastolic BP Percentile --      Boys Systolic BP Percentile --      Boys Diastolic BP Percentile --      Pulse Rate 08/05/23 1239 79     Resp 08/05/23 1239 16     Temp 08/05/23 1239 98.1 F (36.7 C)  Temp Source 08/05/23 1239 Oral     SpO2 08/05/23 1239 96 %     Weight --      Height --      Head Circumference --      Peak Flow --      Pain Score 08/05/23 1242 10     Pain Loc --      Pain Education --      Exclude from Growth Chart --    Orthostatic VS for the past 24 hrs:  BP- Lying Pulse- Lying BP- Sitting Pulse- Sitting BP- Standing at 0 minutes Pulse- Standing at 0 minutes  08/05/23 1248 (!) 158/112 82 (!) 152/92 82 (!) 152/92 101    Updated Vital Signs BP (!) 147/79 (BP Location: Right Arm)   Pulse 79   Temp 98.1 F (36.7 C) (Oral)   Resp 16   LMP  (LMP Unknown)   SpO2 96%   Breastfeeding No   Visual Acuity Right Eye Distance:   Left Eye Distance:   Bilateral Distance:    Right Eye Near:   Left Eye Near:    Bilateral Near:     Physical Exam Vitals and nursing note reviewed.  Constitutional:      Appearance: She is ill-appearing. She is not toxic-appearing.  HENT:     Head: Normocephalic and atraumatic.     Right Ear: Hearing and external ear normal.     Left Ear: Hearing and external ear normal.     Nose: Nose normal.     Mouth/Throat:     Lips: Pink.     Mouth: Mucous membranes are moist.   Eyes:     General: Lids are normal. Vision grossly intact. Gaze aligned appropriately.     Extraocular Movements: Extraocular movements intact.     Conjunctiva/sclera: Conjunctivae normal.    Cardiovascular:     Rate and Rhythm: Normal rate and regular rhythm.     Heart sounds: Normal heart sounds, S1 normal and S2 normal.  Pulmonary:     Effort: Pulmonary  effort is normal. No respiratory distress.     Breath sounds: Normal breath sounds and air entry.  Abdominal:     General: Abdomen is flat and protuberant. Bowel sounds are decreased.     Palpations: Abdomen is soft.     Tenderness: There is abdominal tenderness in the epigastric area and left upper quadrant. There is guarding (Guarding with palpation at the epigastrium). There is no right CVA tenderness, left CVA tenderness or rebound. Negative signs include Murphy's sign and McBurney's sign.   Musculoskeletal:     Cervical back: Neck supple.   Skin:    General: Skin is warm and dry.     Capillary Refill: Capillary refill takes less than 2 seconds.     Findings: No rash.   Neurological:     General: No focal deficit present.     Mental Status: She is alert and oriented to person, place, and time. Mental status is at baseline.     Cranial Nerves: No dysarthria or facial asymmetry.   Psychiatric:        Mood and Affect: Mood normal.        Speech: Speech normal.        Behavior: Behavior normal.        Thought Content: Thought content normal.        Judgment: Judgment normal.      UC Treatments / Results  Labs (all labs ordered are listed, but only  abnormal results are displayed) Labs Reviewed - No data to display  EKG   Radiology No results found.  Procedures Procedures (including critical care time)  Medications Ordered in UC Medications  alum & mag hydroxide-simeth (MAALOX/MYLANTA) 200-200-20 MG/5ML suspension 30 mL (30 mLs Oral Given 08/05/23 1253)  lidocaine  (XYLOCAINE ) 2 % viscous mouth solution 15 mL (15 mLs Mouth/Throat Given 08/05/23 1253)  ondansetron  (ZOFRAN -ODT) disintegrating tablet 4 mg (4 mg Oral Given 08/05/23 1253)    Initial Impression / Assessment and Plan / UC Course  I have reviewed the triage vital signs and the nursing notes.  Pertinent labs & imaging results that were available during my care of the patient were reviewed by me and considered  in my medical decision making (see chart for details).   1.  Hematemesis with nausea, epigastric abdominal pain, sudden onset severe abdominal pain Patient ill-appearing with guarding on exam with palpation of the epigastric abdomen. Differential includes gastric ulcer, pancreatitis, cholecystitis, bile duct obstruction, ACS, etc. Zofran  4 mg ODT and GI cocktail given in clinic.  Orthostatic vital signs are unremarkable here.   She is hypertensive secondary to chronic hypertension and acute epigastric pain. EKG shows normal sinus rhythm without ST/T wave changes.  Normal ventricular rate at 76 bpm.  Recommend further workup and evaluation in the emergency room to rule out intra-abdominal/intrathoracic emergency. Offered [facility] transport, patient declined stating her husband will take her.  Vital signs are hemodynamically stable, she may proceed to the nearest emergency room via private car with her husband.  Discussed risks of deferring ER visit, patient expresses understanding and agreement with plan.  Discharged from urgent care in stable condition.  Final Clinical Impressions(s) / UC Diagnoses   Final diagnoses:  Hematemesis with nausea  Abdominal pain, epigastric  Sudden onset of severe abdominal pain   Discharge Instructions   None    ED Prescriptions   None    PDMP not reviewed this encounter.   Starlene Eaton, FNP 08/05/23 1303    Starlene Eaton, FNP 08/05/23 1304

## 2023-08-05 NOTE — Discharge Instructions (Addendum)
 Your workup today was reassuring.  Please take the Pepcid  as prescribed as well as the Zofran  as needed for nausea or vomiting.  Follow-up with the GI doctor and the Surgeon at the number provided.  Return to the ER for worsening symptoms.

## 2023-08-05 NOTE — ED Provider Triage Note (Signed)
 Emergency Medicine Provider Triage Evaluation Note  Rochelle Larue , a 28 y.o. female  was evaluated in triage.  Pt complains of epigastric pain and hematemesis.  Review of Systems  Positive: Epigastric pain and hematemesis Negative: Blood in stool  Physical Exam  BP (!) 147/105 (BP Location: Left Arm)   Pulse 72   Temp 98 F (36.7 C)   Resp 18   Ht 5' 2 (1.575 m)   Wt 119.7 kg   LMP 07/01/2023 (Approximate)   SpO2 100%   Breastfeeding No   BMI 48.29 kg/m  Gen:   Awake, no distress   Resp:  Normal effort  MSK:   Moves extremities without difficulty  Other:  Epigastric tenderness with no guarding or rebound  Medical Decision Making  Medically screening exam initiated at 1:57 PM.  Appropriate orders placed.  Moon Krempasky was informed that the remainder of the evaluation will be completed by another provider, this initial triage assessment does not replace that evaluation, and the importance of remaining in the ED until their evaluation is complete.  28 year old female presenting to the emergency department today with a few episodes of blood-streaked vomitus.  Has been having issues with epigastric pain over the past few months and was having pain this morning and started with the vomiting and noticed some blood-tinged vomit.  Went to urgent care and was subsequently sent to the emergency department for further evaluation.  Basic labs including LFTs and a lipase are ordered to evaluate for hepatobiliary pathology or pancreatitis.  She has no lower abdominal tenderness to suggest appendicitis at this time.  Will give the patient IV fluids as well as Protonix  and Zofran .  Remainder of evaluation to take place when a bed is available.   Carin Charleston, MD 08/05/23 1359

## 2023-08-05 NOTE — ED Provider Notes (Addendum)
  Physical Exam  BP (!) 147/105 (BP Location: Left Arm)   Pulse 72   Temp 98 F (36.7 C)   Resp 18   Ht 5' 2 (1.575 m)   Wt 119.7 kg   LMP 07/01/2023 (Approximate)   SpO2 100%   Breastfeeding No   BMI 48.29 kg/m   Physical Exam  Procedures  Procedures  ED Course / MDM    Medical Decision Making Amount and/or Complexity of Data Reviewed Labs: ordered. Radiology: ordered.  Risk Prescription drug management.   Assuming care of patient from Dr. Bud Care   Patient in the ED for abd pain. Workup thus far shows reassuring labs.  Concerning findings are as following - none Important pending results are CT abd.  According to Dr. Bud Care, plan is to d/c if CT neg. With Gi f/u.   Patient had no complains, no concerns from the nursing side. Will continue to monitor.    Deatra Face, MD 08/05/23 1707  5:55 PM + ventral hernia on CT. Pt reports that she feels that the pain is moving from abd to chest. Surgery f/u info also added.  The patient appears reasonably screened and/or stabilized for discharge and I doubt any other medical condition or other [facility] requiring further screening, evaluation, or treatment in the ED at this time prior to discharge.   Results from the ER workup discussed with the patient face to face and all questions answered to the best of my ability. The patient is safe for discharge with strict return precautions.     Deatra Face, MD 08/05/23 [phone]

## 2023-08-05 NOTE — ED Provider Notes (Signed)
 [place] EMERGENCY DEPARTMENT AT [facility] Provider Note   CSN: [id number] Arrival date & time: 08/05/23  1338     Patient presents with: Abdominal Pain   Margaret Ingram is a 28 y.o. female.   28 year old female presenting to the emergency department today with abdominal pain.  The patient states she has been having intermittent issues with epigastric abdominal pain now for the past few months.  Reports that she started today with this and it was severe.  She states that when she vomited the first time that she noticed some blood streaking or what appeared to be blood streaking.  She denies any blood in her stool or dark stools.  She went to urgent care was subsequently sent to the ER for further evaluation.  She is not on any NSAIDs and denies any alcohol use.   Abdominal Pain Associated symptoms: nausea and vomiting        Prior to Admission medications   Medication Sig Start Date End Date Taking? Authorizing Provider  ondansetron  (ZOFRAN -ODT) 4 MG disintegrating tablet Take 1 tablet (4 mg total) by mouth every 8 (eight) hours as needed for nausea or vomiting. 08/05/23  Yes Carin Charleston, MD  pantoprazole  (PROTONIX ) 20 MG tablet Take 1 tablet (20 mg total) by mouth daily. 08/05/23  Yes Carin Charleston, MD  acetaminophen  (TYLENOL ) 500 MG tablet Take 2 tablets (1,000 mg total) by mouth every 6 (six) hours. 05/03/23   Izell Marsh, MD  furosemide  (LASIX ) 20 MG tablet Take 1 tablet (20 mg total) by mouth daily. 05/04/23   Izell Marsh, MD  ibuprofen  (ADVIL ) 600 MG tablet Take 1 tablet (600 mg total) by mouth every 6 (six) hours. 05/03/23   Izell Marsh, MD  NIFEdipine  (ADALAT  CC) 90 MG 24 hr tablet Take 1 tablet (90 mg total) by mouth daily. 05/04/23   Izell Marsh, MD  ondansetron  (ZOFRAN -ODT) 4 MG disintegrating tablet Take 1 tablet (4 mg total) by mouth every 8 (eight) hours as needed for nausea. 04/04/23   Granville Layer, MD  oxyCODONE  (OXY  IR/ROXICODONE ) 5 MG immediate release tablet Take 1 tablet (5 mg total) by mouth every 4 (four) hours as needed for breakthrough pain. 05/03/23   Izell Marsh, MD  potassium chloride  SA (KLOR-CON  M) 20 MEQ tablet Take 1 tablet (20 mEq total) by mouth daily. 05/04/23   Izell Marsh, MD  Prenatal Vit-Fe Fumarate-FA (MULTIVITAMIN-PRENATAL) 27-0.8 MG TABS tablet Take 1 tablet by mouth daily. 11/26/22   Almond Army, CNM  senna-docusate (SENOKOT-S) 8.6-50 MG tablet Take 2 tablets by mouth at bedtime as needed for mild constipation. 05/03/23   Izell Marsh, MD    Allergies: Grapefruit concentrate, Azithromycin , Other, and Wound dressing adhesive    Review of Systems  Gastrointestinal:  Positive for abdominal pain, nausea and vomiting.  All other systems reviewed and are negative.   Updated Vital Signs BP (!) 147/105 (BP Location: Left Arm)   Pulse 72   Temp 98 F (36.7 C)   Resp 18   Ht 5' 2 (1.575 m)   Wt 119.7 kg   LMP 07/01/2023 (Approximate)   SpO2 100%   Breastfeeding No   BMI 48.29 kg/m   Physical Exam Vitals and nursing note reviewed.   Gen: Appears uncomfortable Eyes: PERRL, EOMI HEENT: no oropharyngeal swelling Neck: trachea midline Resp: clear to auscultation bilaterally Card: RRR, no murmurs, rubs, or gallops Abd: epigastric tenderness without guarding or rebound Extremities: no  calf tenderness, no edema Vascular: 2+ radial pulses bilaterally, 2+ DP pulses bilaterally Skin: no rashes Psyc: acting appropriately   (all labs ordered are listed, but only abnormal results are displayed) Labs Reviewed  CBC WITH DIFFERENTIAL/PLATELET - Abnormal; Notable for the following components:      Result Value   Hemoglobin 11.2 (*)    HCT 34.7 (*)    All other components within normal limits  COMPREHENSIVE METABOLIC PANEL WITH GFR - Abnormal; Notable for the following components:   CO2 21 (*)    Calcium  8.7 (*)    Albumin 3.4 (*)    All other components  within normal limits  URINALYSIS, ROUTINE W REFLEX MICROSCOPIC - Abnormal; Notable for the following components:   APPearance CLOUDY (*)    Leukocytes,Ua SMALL (*)    Bacteria, UA RARE (*)    All other components within normal limits  LIPASE, BLOOD  HCG, SERUM, QUALITATIVE    EKG: None  Radiology: No results found.   Procedures   Medications Ordered in the ED  pantoprazole  (PROTONIX ) injection 80 mg (80 mg Intravenous Given 08/05/23 1430)  ondansetron  (ZOFRAN ) injection 4 mg (4 mg Intravenous Given 08/05/23 1430)  lactated ringers  bolus 1,000 mL (0 mLs Intravenous Stopped 08/05/23 1552)  morphine  (PF) 4 MG/ML injection 4 mg (4 mg Intravenous Given 08/05/23 1435)  iohexol  (OMNIPAQUE ) 350 MG/ML injection 75 mL (75 mLs Intravenous Contrast Given 08/05/23 1626)                                    Medical Decision Making 28 year old female with no reported past medical history presenting to the emergency department today with abdominal pain and questionable hematemesis.  I will further evaluate the patient here with basic labs including LFTs and a lipase to evaluate for hepatobiliary pathology or pancreatitis.  Will obtain a CT scan of her abdomen as after my initial evaluation she was complaining of worsening/severe abdominal pain to evaluate for perforation.  I will give the patient Protonix  here.  She received a GI cocktail initially.  Will also give her IV fluids and reevaluate for ultimate disposition.  Patient's labs are reassuring.  Plan is for reevaluation after CT scan for ultimate disposition.  If stable think the patient could follow-up with GI as an outpatient.  Amount and/or Complexity of Data Reviewed Labs: ordered. Radiology: ordered.  Risk Prescription drug management.        Final diagnoses:  Epigastric abdominal pain    ED Discharge Orders          Ordered    pantoprazole  (PROTONIX ) 20 MG tablet  Daily        08/05/23 1651    ondansetron  (ZOFRAN -ODT) 4  MG disintegrating tablet  Every 8 hours PRN        08/05/23 1651               Carin Charleston, MD 08/05/23 1656

## 2023-08-05 NOTE — ED Notes (Signed)
 Patient is being discharged from the Urgent Care and sent to the Emergency Department via [facility] . Per Harlo Ligas, patient is in need of higher level of care due to abdomin pain and dizziness. Patient is aware and verbalizes understanding of plan of care.  Vitals:   08/05/23 1239  BP: (!) 147/79  Pulse: 79  Resp: 16  Temp: 98.1 F (36.7 C)  SpO2: 96%

## 2023-08-05 NOTE — ED Triage Notes (Signed)
 Pt from UC for abd pain/epigastric pain that started this morning, 2 episodes of vomiting with minimal blood. Pt received 4mg  of zofran . Pt denies bloody stools. Pt gave birth March 11th.

## 2023-08-05 NOTE — ED Triage Notes (Signed)
 Patient here today with c/o severe abd pain that started this morning. Patient states that she vomited blood this morning. Patient recently gave birth.

## 2023-08-06 ENCOUNTER — Encounter: Payer: Self-pay | Admitting: Gastroenterology

## 2023-09-06 ENCOUNTER — Ambulatory Visit: Payer: MEDICAID

## 2023-09-17 DIAGNOSIS — R011 Cardiac murmur, unspecified: Secondary | ICD-10-CM | POA: Insufficient documentation

## 2023-09-17 DIAGNOSIS — R079 Chest pain, unspecified: Secondary | ICD-10-CM | POA: Insufficient documentation

## 2023-09-17 DIAGNOSIS — B999 Unspecified infectious disease: Secondary | ICD-10-CM | POA: Insufficient documentation

## 2023-09-17 DIAGNOSIS — R0602 Shortness of breath: Secondary | ICD-10-CM | POA: Insufficient documentation

## 2023-09-17 DIAGNOSIS — R519 Headache, unspecified: Secondary | ICD-10-CM | POA: Insufficient documentation

## 2023-09-17 DIAGNOSIS — J45909 Unspecified asthma, uncomplicated: Secondary | ICD-10-CM | POA: Insufficient documentation

## 2023-09-17 DIAGNOSIS — D219 Benign neoplasm of connective and other soft tissue, unspecified: Secondary | ICD-10-CM | POA: Insufficient documentation

## 2023-09-17 DIAGNOSIS — O139 Gestational [pregnancy-induced] hypertension without significant proteinuria, unspecified trimester: Secondary | ICD-10-CM | POA: Insufficient documentation

## 2023-09-17 DIAGNOSIS — R569 Unspecified convulsions: Secondary | ICD-10-CM | POA: Insufficient documentation

## 2023-09-17 DIAGNOSIS — I10 Essential (primary) hypertension: Secondary | ICD-10-CM | POA: Insufficient documentation

## 2023-09-17 DIAGNOSIS — F419 Anxiety disorder, unspecified: Secondary | ICD-10-CM | POA: Insufficient documentation

## 2023-09-18 ENCOUNTER — Ambulatory Visit: Payer: MEDICAID | Admitting: Cardiology

## 2023-09-26 ENCOUNTER — Ambulatory Visit: Payer: MEDICAID | Admitting: Gastroenterology

## 2023-09-26 ENCOUNTER — Ambulatory Visit: Payer: MEDICAID | Attending: Cardiology | Admitting: Cardiology

## 2023-09-26 NOTE — Progress Notes (Deleted)
 Margaret Ingram [id number] [birth year]   Chief Complaint: Nausea, abdominal pain  Referring Provider: No ref. provider found Primary GI MD: Sampson  HPI: Margaret Ingram is a 28 y.o. female with past medical history of anxiety/depression, asthma, obesity, HTN, HSV, seizures, who presents today for a complaint of nausea and abdominal pain.    Patient seen in the ED 08/05/2023 for complaint of epigastric abdominal pain ongoing for a few months and becoming more severe.  Reported vomiting with streaks of blood.  Denied any melena or blood in her stool.  Labs are reassuring.  CT A/P showed a widemouth midline ventral hernia inferior to the umbilicus containing nonobstructed loops of small bowel.  Otherwise no acute findings.  She had a consult with [facility] to discuss hernia repair.  Reportedly had a C-section a few months ago and has noticed a bulge in her lower abdomen just inferior to her umbilicus.  Also noted chest pain occurring when she is having pain from her hernia.  Chest pain is substernal and feels like pressure, not related to swallowing, worse with exertion and with associated shortness of breath.  Robotic ventral hernia repair with mesh recommended 6 months post recent surgery.  There was concern regarding her chest pain and shortness of breath and she was referred to cardiology for further evaluation.  Stat CT PE also ordered to rule out acute pulmonary embolism.  Noted to have had normal EKG at the hospital.  She has an appointment scheduled with cardiology today.    Previous GI Procedures/Imaging      Past Medical History:  Diagnosis Date   Anxiety    Asthma    BMI 45.0-49.9, adult (HCC) 03/01/2023   Cellulitis 05/06/2023   Abdominal wall secondary to lymphatic compression from her pannus, +/- endometritis     Chest pain    Chronic hypertension 08/17/2018   Chronic hypertension affecting pregnancy 11/26/2022   Depression    with pregnancies    Fibroid    Headache    Heart murmur    HSV (herpes simplex virus) infection 03/01/2023   04/2018 in [facility] >> HSV 1 and 2 positive. Patient was never informed. Will continue Valtrex  prophylaxis as a precaution/assuming genital.      Hypertension    chronic   Infection    UTI   Postpartum endometritis 05/04/2023   Pregnancy induced hypertension    Seizures (HCC)    anxiety   Shortness of breath    Status post bilateral salpingectomy 04/30/2023   Unwanted fertility 03/20/2023   Will do at same time as cesarean section      Past Surgical History:  Procedure Laterality Date   CERVICAL CERCLAGE  01/28/2023   CERVICAL CERCLAGE  10/2020   rescue cerclage   CESAREAN SECTION N/A 06/22/2019   Procedure: CESAREAN SECTION;  Surgeon: Eveline Lynwood MATSU, MD;  Location: [facility] LD ORS;  Service: Obstetrics;  Laterality: N/A;  STAT Cord prolapse Liletta    CESAREAN SECTION  03/2021   and cerclage removal   CESAREAN SECTION     CESAREAN SECTION N/A 04/30/2023   Procedure: CESAREAN DELIVERY;  Surgeon: Ilean Norleen GAILS, MD;  Location: [facility] LD ORS;  Service: Obstetrics;  Laterality: N/A;   WISDOM TOOTH EXTRACTION      Current Outpatient Medications  Medication Sig Dispense Refill   acetaminophen  (TYLENOL ) 500 MG tablet Take 2 tablets (1,000 mg total) by mouth every 6 (six) hours. 60 tablet 0   furosemide  (LASIX ) 20 MG tablet Take 1 tablet (  20 mg total) by mouth daily. 5 tablet 0   ibuprofen  (ADVIL ) 600 MG tablet Take 1 tablet (600 mg total) by mouth every 6 (six) hours. 30 tablet 0   NIFEdipine  (ADALAT  CC) 90 MG 24 hr tablet Take 1 tablet (90 mg total) by mouth daily. 30 tablet 0   ondansetron  (ZOFRAN -ODT) 4 MG disintegrating tablet Take 1 tablet (4 mg total) by mouth every 8 (eight) hours as needed for nausea. 5 tablet 0   ondansetron  (ZOFRAN -ODT) 4 MG disintegrating tablet Take 1 tablet (4 mg total) by mouth every 8 (eight) hours as needed for nausea or vomiting. 20 tablet 0   oxyCODONE  (OXY IR/ROXICODONE ) 5  MG immediate release tablet Take 1 tablet (5 mg total) by mouth every 4 (four) hours as needed for breakthrough pain. 15 tablet 0   pantoprazole  (PROTONIX ) 20 MG tablet Take 1 tablet (20 mg total) by mouth daily. 30 tablet 0   potassium chloride  SA (KLOR-CON  M) 20 MEQ tablet Take 1 tablet (20 mEq total) by mouth daily. 5 tablet 0   Prenatal Vit-Fe Fumarate-FA (MULTIVITAMIN-PRENATAL) 27-0.8 MG TABS tablet Take 1 tablet by mouth daily. 30 tablet 0   senna-docusate (SENOKOT-S) 8.6-50 MG tablet Take 2 tablets by mouth at bedtime as needed for mild constipation. 30 tablet 0   No current facility-administered medications for this visit.    Allergies as of 09/26/2023 - Review Complete 08/05/2023  Allergen Reaction Noted   Grapefruit concentrate Swelling 10/25/2020   Azithromycin  Nausea And Vomiting and Nausea Only 06/09/2016   Other Rash 06/16/2019   Wound dressing adhesive Rash 06/16/2019    Family History  Problem Relation Age of Onset   Asthma Mother    Healthy Father    Cancer Maternal Grandmother    Gout Paternal Grandmother    Hypertension Paternal Grandmother    Diabetes Paternal Grandmother    Gout Paternal Uncle    Hypertension Paternal Uncle    Kidney failure Paternal Uncle    Diabetes Other     Social History   Tobacco Use   Smoking status: Never   Smokeless tobacco: Never   Tobacco comments:    occ  Vaping Use   Vaping status: Never Used  Substance Use Topics   Alcohol use: Not Currently    Comment: occasionally    Drug use: Not Currently    Types: Marijuana    Comment: not since I got pregnant     Review of Systems:    Constitutional: No weight loss, fever, chills, weakness or fatigue Eyes: No change in vision Ears, Nose, Throat:  No change in hearing or congestion Skin: No rash or itching Cardiovascular: No chest pain, chest pressure or palpitations   Respiratory: No SOB or cough Gastrointestinal: See HPI and otherwise negative Genitourinary: No  dysuria or change in urinary frequency Neurological: No headache, dizziness or syncope Musculoskeletal: No new muscle or joint pain Hematologic: No bleeding or bruising    Physical Exam:  Vital signs: There were no vitals taken for this visit.  Constitutional: NAD, Well developed, Well nourished, alert and cooperative Head:  Normocephalic and atraumatic.  Eyes: No scleral icterus. Conjunctiva pink. Mouth: No oral lesions. Respiratory: Respirations even and unlabored. Lungs clear to auscultation bilaterally.  No wheezes, crackles, or rhonchi.  Cardiovascular:  Regular rate and rhythm. No murmurs. No peripheral edema. Gastrointestinal:  Soft, nondistended, nontender. No rebound or guarding. Normal bowel sounds. No appreciable masses or hepatomegaly. Rectal:  Not performed.  Neurologic:  Alert and oriented x4;  grossly normal neurologically.  Skin:   Dry and intact without significant lesions or rashes. Psychiatric: Oriented to person, place and time. Demonstrates good judgement and reason without abnormal affect or behaviors.   RELEVANT LABS AND IMAGING: CBC    Component Value Date/Time   WBC 6.1 08/05/2023 1352   RBC 3.99 08/05/2023 1352   HGB 11.2 (L) 08/05/2023 1352   HGB 10.9 (L) 04/04/2023 0952   HGB 11.6 01/28/2023 0000   HCT 34.7 (L) 08/05/2023 1352   HCT 34.7 04/04/2023 0952   HCT 33 01/28/2023 0000   PLT 209 08/05/2023 1352   PLT 189 04/04/2023 0952   PLT 142 01/28/2023 0000   MCV 87.0 08/05/2023 1352   MCV 88 04/04/2023 0952   MCH 28.1 08/05/2023 1352   MCHC 32.3 08/05/2023 1352   RDW 11.8 08/05/2023 1352   RDW 11.3 (L) 04/04/2023 0952   LYMPHSABS 1.5 08/05/2023 1352   MONOABS 0.5 08/05/2023 1352   EOSABS 0.0 08/05/2023 1352   BASOSABS 0.0 08/05/2023 1352    CMP     Component Value Date/Time   NA 136 08/05/2023 1352   NA 138 04/04/2023 0952   K 4.2 08/05/2023 1352   CL 108 08/05/2023 1352   CO2 21 (L) 08/05/2023 1352   GLUCOSE 98 08/05/2023 1352    BUN 11 08/05/2023 1352   BUN 7 04/04/2023 0952   CREATININE 0.69 08/05/2023 1352   CALCIUM  8.7 (L) 08/05/2023 1352   PROT 7.1 08/05/2023 1352   PROT 6.7 04/04/2023 0952   ALBUMIN 3.4 (L) 08/05/2023 1352   ALBUMIN 3.7 (L) 04/04/2023 0952   AST 18 08/05/2023 1352   ALT 13 08/05/2023 1352   ALKPHOS 67 08/05/2023 1352   BILITOT 0.6 08/05/2023 1352   BILITOT 0.3 04/04/2023 0952   GFRNONAA >60 08/05/2023 1352   GFRAA >60 06/22/2019 0748     Assessment/Plan:       Camie Furbish, PA-C  Gastroenterology 09/26/2023, 8:17 AM  Patient Care Team: Pcp, No as PCP - General Cresenzo, John V, MD as PCP - OBGYN (Family Medicine)

## 2023-10-09 ENCOUNTER — Ambulatory Visit: Payer: MEDICAID | Attending: Cardiology | Admitting: Cardiology

## 2023-10-23 ENCOUNTER — Encounter: Payer: Self-pay | Admitting: Cardiology

## 2023-10-23 ENCOUNTER — Ambulatory Visit: Payer: MEDICAID | Attending: Cardiology | Admitting: Cardiology

## 2023-10-23 VITALS — BP 160/100 | HR 70 | Ht 62.0 in | Wt 281.8 lb

## 2023-10-23 DIAGNOSIS — R0602 Shortness of breath: Secondary | ICD-10-CM

## 2023-10-23 DIAGNOSIS — I10 Essential (primary) hypertension: Secondary | ICD-10-CM | POA: Diagnosis not present

## 2023-10-23 DIAGNOSIS — Z01810 Encounter for preprocedural cardiovascular examination: Secondary | ICD-10-CM

## 2023-10-23 DIAGNOSIS — R011 Cardiac murmur, unspecified: Secondary | ICD-10-CM

## 2023-10-23 DIAGNOSIS — Z131 Encounter for screening for diabetes mellitus: Secondary | ICD-10-CM

## 2023-10-23 MED ORDER — AMLODIPINE BESYLATE 5 MG PO TABS: 5.0000 mg | ORAL_TABLET | Freq: Every day | ORAL | 3 refills | Status: DC

## 2023-10-23 NOTE — Progress Notes (Signed)
 Cardiology Office Note:    Date:  10/23/2023   ID:  Margaret Ingram, DOB 01-18-1996, MRN [id number]  PCP:  Pcp, No  Cardiologist:  Jennifer JONELLE Crape, MD   Referring MD: Lyndel Deward PARAS, MD    ASSESSMENT:    1. Shortness of breath   2. Essential hypertension   3. Heart murmur   4. Morbid obesity (HCC)   5. Pre-operative cardiovascular examination    PLAN:    In order of problems listed above:  Primary prevention stressed with the patient.  Importance of compliance with diet medication stressed and patient verbalized standing. Essential hypertension: Blood pressure is significantly elevated.  She is not taking any medications and I am not clear why.  Diet including salt intake issues were discussed.  Lifestyle modification was urged.  I initiated amlodipine  5 mg daily and she will keep a track of her blood pressures and get it for me. Chest pain and preop cardiovascular evaluation: To evaluate her bile and to reassure her we will do an exercise stress echo she is agreeable. Cardiac murmur: Echocardiogram will be done to assess murmur heard on auscultation. I would also like to evaluate her from risk stratification and to lipid screening in the next few days.  She is agreeable. Morbid obesity: Weight reduction stressed diet emphasized and she promises to do better.  Risk of obesity explained. Patient will be seen in follow-up appointment in 6 months or earlier if the patient has any concerns.    Medication Adjustments/Labs and Tests Ordered: Current medicines are reviewed at length with the patient today.  Concerns regarding medicines are outlined above.  Orders Placed This Encounter  Procedures   EKG 12-Lead   No orders of the defined types were placed in this encounter.    History of Present Illness:    Margaret Ingram is a 28 y.o. female who is being seen today for the evaluation of essential hypertension and preop cardiovascular evaluation at the request of  Stechschulte, Deward PARAS, MD. patient is a pleasant 28 year old female.  She has past medical history of essential hypertension and morbid obesity.  She is planning to undergo major hernia surgery and is here for evaluation.  She mentions to me that she is done with pregnancy and having children.  She has 5 kids.  She tells me that her tubes have been removed.  No chest pain orthopnea or PND.  At the time of my evaluation, the patient is alert awake oriented and in no distress.  She occasionally has chest discomfort but this is not related to exertion.  She walks around and takes care of activities of daily living without any problem.  Past Medical History:  Diagnosis Date   Anxiety    Asthma    BMI 45.0-49.9, adult (HCC) 03/01/2023   Cellulitis 05/06/2023   Abdominal wall secondary to lymphatic compression from her pannus, +/- endometritis     Chest pain    Chronic hypertension 08/17/2018   Chronic hypertension affecting pregnancy 11/26/2022   Depression    with pregnancies   Fibroid    Headache    Heart murmur    HSV (herpes simplex virus) infection 03/01/2023   04/2018 in [facility] >> HSV 1 and 2 positive. Patient was never informed. Will continue Valtrex  prophylaxis as a precaution/assuming genital.      Hypertension    chronic   Infection    UTI   Postpartum endometritis 05/04/2023   Pregnancy induced hypertension    Seizures (HCC)  anxiety   Shortness of breath    Status post bilateral salpingectomy 04/30/2023   Unwanted fertility 03/20/2023   Will do at same time as cesarean section      Past Surgical History:  Procedure Laterality Date   CERVICAL CERCLAGE  01/28/2023   CERVICAL CERCLAGE  10/2020   rescue cerclage   CESAREAN SECTION N/A 06/22/2019   Procedure: CESAREAN SECTION;  Surgeon: Eveline Lynwood MATSU, MD;  Location: [facility] LD [facility]S;  Service: Obstetrics;  Laterality: N/A;  STAT Cord prolapse Liletta    CESAREAN SECTION  03/2021   and cerclage removal   CESAREAN SECTION      CESAREAN SECTION N/A 04/30/2023   Procedure: CESAREAN DELIVERY;  Surgeon: Ilean Norleen GAILS, MD;  Location: [facility] LD ORS;  Service: Obstetrics;  Laterality: N/A;   WISDOM TOOTH EXTRACTION      Current Medications: No outpatient medications have been marked as taking for the 10/23/23 encounter (Office Visit) with Trasean Delima, Jennifer SAUNDERS, MD.     Allergies:   Grapefruit concentrate, Azithromycin , Other, and Wound dressing adhesive   Social History   Socioeconomic History   Marital status: Single    Spouse name: Not on file   Number of children: Not on file   Years of education: Not on file   Highest education level: GED or equivalent  Occupational History   Not on file  Tobacco Use   Smoking status: Never   Smokeless tobacco: Never   Tobacco comments:    occ  Vaping Use   Vaping status: Never Used  Substance and Sexual Activity   Alcohol use: Not Currently    Comment: occasionally    Drug use: Not Currently    Types: Marijuana    Comment: not since I got pregnant   Sexual activity: Not Currently    Birth control/protection: None  Other Topics Concern   Not on file  Social History Narrative   Not on file   Social Drivers of Health   Financial Resource Strain: Low Risk  (04/19/2023)   Overall Financial Resource Strain (CARDIA)    Difficulty of Paying Living Expenses: Not hard at all  Food Insecurity: Low Risk  (06/11/2023)   Received from [facility]   Hunger Vital Sign    Within the past 12 months, you worried that your food would run out before you got money to buy more: Never true    Within the past 12 months, the food you bought just didn't last and you didn't have money to get more. : Never true  Transportation Needs: No Transportation Needs (06/11/2023)   [facility] from [facility]   Transportation    In the past 12 months, has lack of reliable transportation kept you from medical appointments, meetings, work or from getting things needed for daily living? : No  Physical  Activity: Insufficiently Active (04/19/2023)   Exercise Vital Sign    Days of Exercise per Week: 2 days    Minutes of Exercise per Session: 30 min  Stress: Stress Concern Present (04/19/2023)   [employer] of Occupational Health - Occupational Stress Questionnaire    Feeling of Stress : Rather much  Social Connections: Socially Isolated (05/04/2023)   Social Connection and Isolation Panel    Frequency of Communication with Friends and Family: More than three times a week    Frequency of Social Gatherings with Friends and Family: Once a week    Attends Religious Services: Never    Database administrator or Organizations: No  Attends Banker Meetings: Not on file    Marital Status: Never married     Family History: The patient's family history includes Asthma in her mother; Cancer in her maternal grandmother; Diabetes in her paternal grandmother and another family member; Gout in her paternal grandmother and paternal uncle; Healthy in her father; Hypertension in her paternal grandmother and paternal uncle; Kidney failure in her paternal uncle.  ROS:   Please see the history of present illness.    All other systems reviewed and are negative.  EKGs/Labs/Other Studies Reviewed:    The following studies were reviewed today:  EKG Interpretation Date/Time:  Wednesday October 23 2023 09:07:16 EDT Ventricular Rate:  70 PR Interval:  156 QRS Duration:  90 QT Interval:  400 QTC Calculation: 432 R Axis:   46  Text Interpretation: Normal sinus rhythm Normal ECG When compared with ECG of 05-Aug-2023 13:02, ST now depressed in Inferior leads Confirmed by Edwyna Backers [phone]) on 10/23/2023 9:27:06 AM     Recent Labs: 12/12/2022: TSH 2.34 04/30/2023: Magnesium  4.2 08/05/2023: ALT 13; BUN 11; Creatinine, Ser 0.69; Hemoglobin 11.2; Platelets 209; Potassium 4.2; Sodium 136  Recent Lipid Panel No results found for: CHOL, TRIG, HDL, CHOLHDL, VLDL, LDLCALC,  LDLDIRECT  Physical Exam:    VS:  BP (!) 158/108   Pulse 70   Ht 5' 2 (1.575 m)   Wt 281 lb 12.8 oz (127.8 kg)   SpO2 99%   BMI 51.54 kg/m     Wt Readings from Last 3 Encounters:  10/23/23 281 lb 12.8 oz (127.8 kg)  08/05/23 264 lb (119.7 kg)  04/25/23 278 lb (126.1 kg)     GEN: Patient is in no acute distress HEENT: Normal NECK: No JVD; No carotid bruits LYMPHATICS: No lymphadenopathy CARDIAC: S1 S2 regular, 2/6 systolic murmur at the apex. RESPIRATORY:  Clear to auscultation without rales, wheezing or rhonchi  ABDOMEN: Soft, non-tender, non-distended MUSCULOSKELETAL:  No edema; No deformity  SKIN: Warm and dry NEUROLOGIC:  Alert and oriented x 3 PSYCHIATRIC:  Normal affect    Signed, Backers JONELLE Edwyna, MD  10/23/2023 9:38 AM    [facility]

## 2023-10-23 NOTE — Patient Instructions (Signed)
 Please keep a BP log for 2 weeks and send by MyChart or mail.                         Name and DOB__________________________ Dr. Edwyna [address] [place], [place] [address]  Blood Pressure Record Sheet To take your blood pressure, you will need a blood pressure machine. You can buy a blood pressure machine (blood pressure monitor) at your clinic, drug store, or online. When choosing one, consider: An automatic monitor that has an arm cuff. A cuff that wraps snugly around your upper arm. You should be able to fit only one finger between your arm and the cuff. A device that stores blood pressure reading results. Do not choose a monitor that measures your blood pressure from your wrist or finger. Follow your health care provider's instructions for how to take your blood pressure. To use this form: Get one reading in the morning (a.m.) 1-2 hours after you take any medicines. Get one reading in the evening (p.m.) before supper.   Blood pressure log Date: _______________________  a.m. _____________________(1st reading) HR___________            p.m. _____________________(2nd reading) HR__________  Date: _______________________  a.m. _____________________(1st reading) HR___________            p.m. _____________________(2nd reading) HR__________  Date: _______________________  a.m. _____________________(1st reading) HR___________            p.m. _____________________(2nd reading) HR__________  Date: _______________________  a.m. _____________________(1st reading) HR___________            p.m. _____________________(2nd reading) HR__________  Date: _______________________  a.m. _____________________(1st reading) HR___________            p.m. _____________________(2nd reading) HR__________  Date: _______________________  a.m. _____________________(1st reading) HR___________            p.m. _____________________(2nd reading) HR__________  Date: _______________________  a.m.  _____________________(1st reading) HR___________            p.m. _____________________(2nd reading) HR__________   This information is not intended to replace advice given to you by your health care provider. Make sure you discuss any questions you have with your health care provider. Document Revised: 05/27/2019 Document Reviewed: 05/27/2019 Elsevier Patient Education  2021 Elsevier Inc.   Medication Instructions:  Your physician has recommended you make the following change in your medication:   Stop Furosemide   Stop Nifedipine   Start Amlodipine  5 mg daily.  *If you need a refill on your cardiac medications before your next appointment, please call your pharmacy*   Lab Work: Your physician recommends that you return for lab work in: the next few days for CMP and lipids. You need to have labs done when you are fasting.  You can come Monday through Friday 8:30 am to 12:00 pm and 1:15 to 4:30. You do not need to make an appointment as the order has already been placed.   If you have labs (blood work) drawn today and your tests are completely normal, you will receive your results only by: MyChart Message (if you have MyChart) OR A paper copy in the mail If you have any lab test that is abnormal or we need to change your treatment, we will call you to review the results.   Testing/Procedures:  Stress Echocardiogram Instructions:    1. You may take all of your medications.  2. No food, drink or tobacco products four hours prior to your test.  3. Dress prepared  to exercise. Best to wear 2 piece outfit and tennis shoes. Shoes must be closed toe.  4. Please bring all current prescription medications.  Your physician has requested that you have an echocardiogram. Echocardiography is a painless test that uses sound waves to create images of your heart. It provides your doctor with information about the size and shape of your heart and how well your heart's chambers and valves are  working. This procedure takes approximately one hour. There are no restrictions for this procedure. Please do NOT wear cologne, perfume, aftershave, or lotions (deodorant is allowed). Please arrive 15 minutes prior to your appointment time.  Please note: We ask at that you not bring children with you during ultrasound (echo/ vascular) testing. Due to room size and safety concerns, children are not allowed in the ultrasound rooms during exams. Our front office staff cannot provide observation of children in our lobby area while testing is being conducted. An adult accompanying a patient to their appointment will only be allowed in the ultrasound room at the discretion of the ultrasound technician under special circumstances. We apologize for any inconvenience.   Follow-Up: At [facility], you and your health needs are our priority.  As part of our continuing mission to provide you with exceptional heart care, we have created designated Provider Care Teams.  These Care Teams include your primary Cardiologist (physician) and Advanced Practice Providers (APPs -  Physician Assistants and Nurse Practitioners) who all work together to provide you with the care you need, when you need it.  We recommend signing up for the patient portal called MyChart.  Sign up information is provided on this After Visit Summary.  MyChart is used to connect with patients for Virtual Visits (Telemedicine).  Patients are able to view lab/test results, encounter notes, upcoming appointments, etc.  Non-urgent messages can be sent to your provider as well.   To learn more about what you can do with MyChart, go to [email or web address].    Your next appointment:   6 month(s)  The format for your next appointment:   In Person  Provider:   Jennifer Crape, MD   Other Instructions Exercise Stress Echocardiogram An exercise stress echocardiogram is a test to check how well your heart is working. This test uses sound  waves and a computer to make pictures of your heart. These pictures will be taken before and after you exercise. For this test, you will walk on a treadmill or ride a bicycle to make your heart beat faster. While you exercise, your heart will be checked with an electrocardiogram (ECG). Your blood pressure will also be checked. You may have this test if: You have chest pain or a heart problem. You had a heart attack or heart surgery not long ago. You have heart valve problems. You have a condition that causes narrowing of the blood vessels that supply your heart. You have a high risk of heart disease and: You are starting a new exercise program. You need to have a big surgery. Tell a doctor about: Any allergies you have. All medicines you are taking. This includes vitamins, herbs, eye drops, creams, and over-the-counter medicines. Any problems you or family members have had with medicines that make you fall asleep (anesthetic medicines). Any surgeries you have had. Any blood disorders you have. Any medical conditions you have. Whether you are pregnant or may be pregnant. What are the risks? Generally, this is a safe test. However, problems may occur, including: Chest pain. Feeling dizzy  or light-headed. Shortness of breath. Increased or irregular heartbeat. Feeling like you may vomit (nausea) or vomiting. Heart attack. This is very rare. What happens before the test? Medicines Ask your doctor about changing or stopping your normal medicines. This is important if you take diabetes medicines or blood thinners. If you use an inhaler, bring it to the test. General instructions Wear comfortable clothes and walking shoes. Follow instructions from your doctor about what you cannot eat or drink before the test. Do not drink or eat anything that has caffeine  in it. Stop having caffeine  24 hours before the test. Do not smoke or use products that contain nicotine or tobacco for 4 hours before  the test. If you need help quitting, ask your doctor. What happens during the test?  You will take off your clothes from the waist up and put on a hospital gown. Electrodes or patches will be put on your chest. A blood pressure cuff will be put on your arm. Before you exercise, a computer will make a picture of your heart. To do this: You will lie down and a gel will be put on your chest. A wand will be moved over the gel. Sound waves from the wand will go to the computer to make the picture. Then, you will start to exercise. You may walk on a treadmill or pedal a bicycle. Your blood pressure and heart rhythm will be checked while you exercise. The exercise will get harder or faster. You will exercise until: Your heart reaches a certain level. You are too tired to go on. You cannot go on because of chest pain, weakness, or dizziness. You will lie down right away so another picture of your heart can be taken. The procedure may vary among doctors and hospitals. What can I expect after the test? After your test, it is common to have: Mild soreness. Mild tiredness. Your heart rate and blood pressure will be checked until they return to your normal levels. You should not have any new symptoms after this test. Follow these instructions at home: If your doctor says that you can, you may: Eat what you normally eat. Do your normal activities. Take over-the-counter and prescription medicines only as told by your doctor. Keep all follow-up visits. It is up to you to get the results of your test. Ask how to get your results when they are ready. Contact a doctor if: You feel dizzy or light-headed. You have a fast or irregular heartbeat. You feel like you may vomit or you vomit. You have a headache. You feel short of breath. Get help right away if: You develop pain or pressure: In your chest. In your jaw or neck. Between your shoulders. That goes down your left arm. You faint. You have  trouble breathing. These symptoms may be an emergency. Get medical help right away. Call your local emergency services (911 in the [place] Do not wait to see if the symptoms will go away. Do not drive yourself to the hospital. Summary This is a test that checks how well your heart is working. Follow instructions about what you cannot eat or drink before the test. Ask your doctor if you should take your normal medicines before the test. Stop having caffeine  24 hours before the test. Do not smoke or use products with nicotine or tobacco in them for 4 hours before the test. During the test, your blood pressure and heart rhythm will be checked while you exercise. This information is not intended  to replace advice given to you by your health care provider. Make sure you discuss any questions you have with your health care provider. Document Revised: 10/19/2020 Document Reviewed: 09/29/2019 Elsevier Patient Education  2022 Elsevier Inc.  Echocardiogram An echocardiogram is a test that uses sound waves to make images of your heart. This way of making images is often called ultrasound. The images from this test can help find out many things about your heart, including: The size and shape of your heart. The strength of your heart muscle and how well it's working. The size, thickness, and movement of your heart's walls. How your heart valves are working. Problems such as: A tumor or a growth from an infection around the heart valves. Areas of heart muscle that aren't working well because of poor blood flow or injury from a heart attack. An aneurysm. This is a weak or damaged part of an artery wall. An artery is a blood vessel. Tell a health care provider about: Any allergies you have. All medicines you're taking, including vitamins, herbs, eye drops, creams, and over-the-counter medicines. Any bleeding problems you have. Any surgeries you've had. Any medical problems you have. Whether you're  pregnant or may be pregnant. What are the risks? Your health care provider will talk with you about risks. These may include an allergic reaction to IV dye that may be used during the test. What happens before the test? You don't need to do anything to get ready for this test. You may eat and drink normally. What happens during the test?  You'll take off your clothes from the waist up and put on a hospital gown. Sticky patches called electrodes may be placed on your chest. These will be connected to a machine that monitors your heart rate and rhythm. You'll lie down on a table for the exam. A wand covered in gel will be moved over your chest. Sound waves from the wand will go to your heart and bounce back--or echo back. The sound waves will go to a computer that uses them to make images of your heart. The images can be viewed on a monitor. The images will also be recorded on the computer so your provider can look at them later. You may be asked to change positions or hold your breath for a short time. This makes it easier to get different views or better views of your heart. In some cases, you may be given a dye through an IV. The IV is put into one of your veins. This dye can make the areas of your heart easier to see. The procedure may vary among providers and hospitals. What can I expect after the test? You may return to your normal diet, activities, and medicines unless your provider tells you not to. If an IV was placed for the test, it will be removed. It's up to you to get the results of your test. Ask your provider, or the department that's doing the test, when your results will be ready. This information is not intended to replace advice given to you by your health care provider. Make sure you discuss any questions you have with your health care provider. Document Revised: 04/06/2022 Document Reviewed: 04/06/2022 Elsevier Patient Education  2024 [employer].

## 2023-10-31 ENCOUNTER — Telehealth: Payer: Self-pay

## 2023-10-31 NOTE — Telephone Encounter (Signed)
   Pre-operative Risk Assessment    Patient Name: Margaret Ingram  DOB: 01/04/1996 MRN: [id number]   Date of last office visit: 10/23/23 Date of next office visit: 11/14/23 for echo and 12/17/23 stress echo   Request for Surgical Clearance    Procedure:  ventral hernia repair with mesh, bilateral posterior rectus myofascial release and possible bilateral transverse abdominus release   Date of Surgery:  Clearance TBD                                Surgeon:  Deward Foy, MD Surgeon's Group or Practice Name:  [facility] Phone number:  [phone] Fax number:  [phone] Roseline Argyle, CMA   Type of Clearance Requested:   - Medical    Type of Anesthesia:  General    Additional requests/questions:    Bonney Annabella LITTIE Dorlene   10/31/2023, 4:00 PM

## 2023-11-14 ENCOUNTER — Ambulatory Visit: Payer: MEDICAID | Attending: Cardiology

## 2023-11-21 ENCOUNTER — Encounter: Payer: Self-pay | Admitting: Cardiology

## 2023-12-04 ENCOUNTER — Encounter: Payer: Self-pay | Admitting: *Deleted

## 2023-12-16 ENCOUNTER — Telehealth: Payer: Self-pay | Admitting: Cardiology

## 2023-12-16 NOTE — Telephone Encounter (Signed)
 Left vm to return call.

## 2023-12-16 NOTE — Telephone Encounter (Signed)
 Patient states she's having a burning pain in her chest and it's getting worse, she concerned and would like a call back. CB# [phone]

## 2023-12-17 ENCOUNTER — Ambulatory Visit: Payer: MEDICAID

## 2023-12-17 NOTE — Telephone Encounter (Signed)
 Left vm to return call.

## 2023-12-19 NOTE — Telephone Encounter (Signed)
 Left vm to return call.

## 2024-01-01 NOTE — Telephone Encounter (Signed)
   Patient Name: Margaret Ingram  DOB: 1995-08-21 MRN: [id number]  Primary Cardiologist: Dr. Edwyna  Chart reviewed as part of pre-operative protocol coverage. Patient was already seen Dr. Revankar by 10/23/23 for pre-op evaluation at which time further testing is pending. Stress echo is scheduled for 01/14/24.  Per office protocol, the provider who saw the patient should forward their finalized clearance decision to requesting party below upon completion of testing. I will route this message to Dr. Edwyna so they are aware of where to fax final recommendation to.  Will route this message to surgeon as FYI. Will remove this message from the pre-op box as separate preop APP input is not needed.  Raphael LOISE Bring, PA-C 01/01/2024, 10:01 AM

## 2024-01-08 ENCOUNTER — Telehealth: Payer: Self-pay | Admitting: *Deleted

## 2024-01-08 NOTE — Telephone Encounter (Signed)
 Left a detailed message on voicemail regarding a Stress Echo Test on 01/14/24 at 1:45.

## 2024-01-14 ENCOUNTER — Ambulatory Visit: Payer: MEDICAID

## 2024-01-31 ENCOUNTER — Telehealth: Payer: Self-pay | Admitting: Cardiology

## 2024-01-31 DIAGNOSIS — Z01810 Encounter for preprocedural cardiovascular examination: Secondary | ICD-10-CM

## 2024-02-05 ENCOUNTER — Other Ambulatory Visit: Payer: MEDICAID

## 2024-02-05 ENCOUNTER — Ambulatory Visit: Payer: MEDICAID

## 2024-02-14 ENCOUNTER — Telehealth: Payer: Self-pay

## 2024-02-14 NOTE — Telephone Encounter (Signed)
 Surgical clearance has been received again but pt has not completed her testing. Can you reach out and see if you can get her scheduled for her GXT?

## 2024-02-21 ENCOUNTER — Encounter: Payer: Self-pay | Admitting: Cardiology

## 2024-03-20 NOTE — Telephone Encounter (Signed)
 error

## 2024-03-26 ENCOUNTER — Telehealth: Payer: Self-pay | Admitting: Family Medicine

## 2024-03-26 ENCOUNTER — Telehealth: Payer: Self-pay

## 2024-03-26 ENCOUNTER — Emergency Department
Admission: EM | Admit: 2024-03-26 | Discharge: 2024-03-26 | Disposition: A | Discharge status: Home / Self Care | Payer: MEDICAID | Attending: Emergency Medicine | Admitting: Emergency Medicine

## 2024-03-26 ENCOUNTER — Other Ambulatory Visit: Payer: Self-pay

## 2024-03-26 DIAGNOSIS — R1030 Lower abdominal pain, unspecified: Secondary | ICD-10-CM | POA: Insufficient documentation

## 2024-03-26 DIAGNOSIS — R109 Unspecified abdominal pain: Secondary | ICD-10-CM

## 2024-03-26 LAB — COMPREHENSIVE METABOLIC PANEL WITH GFR
ALT: 11 U/L (ref 0–44)
AST: 18 U/L (ref 15–41)
Albumin: 4 g/dL (ref 3.5–5.0)
Alkaline Phosphatase: 90 U/L (ref 38–126)
Anion gap: 11 (ref 5–15)
BUN: 11 mg/dL (ref 6–20)
CO2: 24 mmol/L (ref 22–32)
Calcium: 8.9 mg/dL (ref 8.9–10.3)
Chloride: 105 mmol/L (ref 98–111)
Creatinine, Ser: 0.7 mg/dL (ref 0.44–1.00)
GFR, Estimated: >60 mL/min
Glucose, Bld: 102 mg/dL — ABNORMAL HIGH (ref 70–99)
Potassium: 3.9 mmol/L (ref 3.5–5.1)
Sodium: 139 mmol/L (ref 135–145)
Total Bilirubin: 0.3 mg/dL (ref 0.0–1.2)
Total Protein: 7.5 g/dL (ref 6.5–8.1)

## 2024-03-26 LAB — LIPASE, BLOOD: Lipase: 18 U/L (ref 11–51)

## 2024-03-26 LAB — URINALYSIS, ROUTINE W REFLEX MICROSCOPIC
Bilirubin Urine: NEGATIVE
Glucose, UA: NEGATIVE mg/dL
Hgb urine dipstick: NEGATIVE
Ketones, ur: NEGATIVE mg/dL
Leukocytes,Ua: NEGATIVE
Nitrite: NEGATIVE
Protein, ur: NEGATIVE mg/dL
Specific Gravity, Urine: 1.016 (ref 1.005–1.030)
pH: 6 (ref 5.0–8.0)

## 2024-03-26 LAB — CBC
HCT: 31.9 % — ABNORMAL LOW (ref 36.0–46.0)
Hemoglobin: 10.6 g/dL — ABNORMAL LOW (ref 12.0–15.0)
MCH: 27.7 pg (ref 26.0–34.0)
MCHC: 33.2 g/dL (ref 30.0–36.0)
MCV: 83.5 fL (ref 80.0–100.0)
Platelets: 217 10*3/uL (ref 150–400)
RBC: 3.82 MIL/uL — ABNORMAL LOW (ref 3.87–5.11)
RDW: 11.8 % (ref 11.5–15.5)
WBC: 6.1 10*3/uL (ref 4.0–10.5)
nRBC: 0 % (ref 0.0–0.2)

## 2024-03-26 LAB — HCG, QUANTITATIVE, PREGNANCY: hCG, Beta Chain, Quant, S: <1 m[IU]/mL

## 2024-03-26 LAB — POC URINE PREG, ED: Preg Test, Ur: NEGATIVE

## 2024-03-26 MED ORDER — AMLODIPINE BESYLATE 5 MG PO TABS: 5.0000 mg | ORAL_TABLET | Freq: Every day | ORAL | 5 refills | Status: AC

## 2024-03-26 NOTE — Telephone Encounter (Signed)
 Pt reports that she is pregnant with vaginal bleeding and she had a BTL.  I advised pt that she needs to go to [facility] asap as she should not be pregnant and she is reporting bleeding.  Pt verbalized understanding.    Giovannie Scerbo,RN  03/26/24

## 2024-03-26 NOTE — ED Triage Notes (Signed)
 Pt BIB EMS from home with complaints of ABD pain. Pt states she had both tubes removed March of 2025. Pt states she took a pregnancy test at home and it was positive. Pt states PCP told her to come to the ER. Pt endorses nausea but denies vomiting.

## 2024-03-26 NOTE — Discharge Instructions (Addendum)
 Please call the number provided for general surgery to arrange a follow-up appointment.  Please take your blood pressure medication each morning.  Please follow-up with your primary care doctor since you are able.  Return to the emergency department for any symptom personally concerning to yourself.

## 2024-03-26 NOTE — ED Provider Notes (Signed)
 "  [facility] Provider Note    Event Date/Time   First MD Initiated Contact with Patient 03/26/24 1800     (approximate)  History   Chief Complaint: Abdominal Pain  HPI  Tamaya Pun is a 29 y.o. female with a past medical history of anxiety, hypertension, presents to the emergency department for abdominal pain and a positive pregnancy test.  According to the patient she is several days late on her menstrual cycle and states last month she had a heavier than normal menstrual cycle.  Patient states she took a pregnancy test at home and it was positive.  Patient was concerned as she has already had a tubal ligation so she came to the emergency department for further evaluation.  Patient states some vague lower abdominal pain she does have a known hernia in her abdomen per patient and states she does have some chronic pain from this but feels like it is worse recently.  Physical Exam   Triage Vital Signs: ED Triage Vitals  Encounter Vitals Group     BP 03/26/24 1803 (!) 134/122     Girls Systolic BP Percentile --      Girls Diastolic BP Percentile --      Boys Systolic BP Percentile --      Boys Diastolic BP Percentile --      Pulse Rate 03/26/24 1803 81     Resp 03/26/24 1803 20     Temp 03/26/24 1803 98.1 F (36.7 C)     Temp Source 03/26/24 1803 Oral     SpO2 03/26/24 1803 100 %     Weight 03/26/24 1803 (!) 302 lb 8 oz (137.2 kg)     Height 03/26/24 1803 5' 2 (1.575 m)     Head Circumference --      Peak Flow --      Pain Score 03/26/24 1841 7     Pain Loc --      Pain Education --      Exclude from Growth Chart --     Most recent vital signs: Vitals:   03/26/24 1803  BP: (!) 134/122  Pulse: 81  Resp: 20  Temp: 98.1 F (36.7 C)  SpO2: 100%    General: Awake, no distress.  CV:  Good peripheral perfusion.  Regular rate and rhythm  Resp:  Normal effort.  Equal breath sounds bilaterally.  Abd:  No distention.  Soft, minimal tenderness  over the lower abdomen.  No obvious hernia palpated however exam limited by body habitus.   ED Results / Procedures / Treatments   MEDICATIONS ORDERED IN ED: Medications - No data to display   IMPRESSION / MDM / ASSESSMENT AND PLAN / ED COURSE  I reviewed the triage vital signs and the nursing notes.  Patient's presentation is most consistent with acute presentation with potential threat to life or bodily function.  Patient presents emergency department for lower abdominal discomfort and a positive pregnancy test at home.  Patient states she had a tubal ligation and had a heavier than normal period last month she is concerned.  She also states that secondary complaint her blood pressure has been very elevated recently.  Patient states she just moved to this area and is currently trying to see a PCP has an appointment scheduled for April had been on blood pressure medication prior to this but no longer has any.  Patient's workup in the emergency department is reassuring.  Very minimal lower abdominal tenderness.  No  hernia palpated.  Patient denies any vomiting or constipation.  Patient's lab work shows a reassuring CBC with a normal white blood cell count, reassuring chemistry, normal urinalysis.  Patient's urine pregnancy test is negative we will check a quantitative beta-hCG.  Patient's quantitative hCG is negative as well.  Given the patient's reassuring workup we will refer her to surgery for outpatient follow-up for her hernia patient has a PCP appointment.  Will prescribe amlodipine  5 mg daily for the next several months until the patient can see her PCP in April.  Patient is agreeable to this plan of care.  Patient is reassured by the negative pregnancy test.  FINAL CLINICAL IMPRESSION(S) / ED DIAGNOSES   Abdominal pain    Note:  This document was prepared using Dragon voice recognition software and may include unintentional dictation errors.   Dorothyann Drivers, MD 03/26/24  1920  "

## 2024-03-26 NOTE — Telephone Encounter (Signed)
 Patient called saying that she thinks she may be pregnant because she got two positive pregnancy test at home but she had a tubal with us  last year. After speaking with Dr.Bass I advised the patient to go to the MAU so that she can be evaluated and then to follow back up with our office.

## 2024-04-13 ENCOUNTER — Ambulatory Visit: Payer: Self-pay | Admitting: Surgery
